# Patient Record
Sex: Male | Born: 1945
Health system: Southern US, Community
[De-identification: ages and names within clinical notes are randomized; demographics above are authoritative.]

## PROBLEM LIST (undated history)

## (undated) DIAGNOSIS — E785 Hyperlipidemia, unspecified: Secondary | ICD-10-CM

## (undated) DIAGNOSIS — N410 Acute prostatitis: Secondary | ICD-10-CM

## (undated) DIAGNOSIS — M1711 Unilateral primary osteoarthritis, right knee: Secondary | ICD-10-CM

## (undated) DIAGNOSIS — I10 Essential (primary) hypertension: Secondary | ICD-10-CM

## (undated) DIAGNOSIS — S2249XA Multiple fractures of ribs, unspecified side, initial encounter for closed fracture: Secondary | ICD-10-CM

## (undated) DIAGNOSIS — D219 Benign neoplasm of connective and other soft tissue, unspecified: Secondary | ICD-10-CM

## (undated) HISTORY — DX: Hyperlipidemia, unspecified: E78.5

## (undated) HISTORY — PX: OTHER SURGICAL HISTORY: SHX169

## (undated) HISTORY — DX: Multiple fractures of ribs, unspecified side, initial encounter for closed fracture: S22.49XA

## (undated) HISTORY — DX: Benign neoplasm of connective and other soft tissue, unspecified: D21.9

## (undated) HISTORY — DX: Acute prostatitis: N41.0

## (undated) HISTORY — DX: Essential (primary) hypertension: I10

## (undated) HISTORY — PX: CORONARY ARTERY BYPASS GRAFT: SHX141

## (undated) HISTORY — DX: Unilateral primary osteoarthritis, right knee: M17.11

---

## 2004-10-04 ENCOUNTER — Ambulatory Visit: Payer: Self-pay | Admitting: Internal Medicine

## 2005-11-08 ENCOUNTER — Ambulatory Visit: Payer: Self-pay | Admitting: Internal Medicine

## 2005-12-02 HISTORY — PX: COX-MAZE MICROWAVE ABLATION: SHX1404

## 2005-12-02 HISTORY — PX: EXCISION OF ATRIAL MYXOMA: SHX5821

## 2005-12-02 HISTORY — PX: CORONARY ARTERY BYPASS GRAFT: SHX141

## 2006-01-03 ENCOUNTER — Ambulatory Visit: Payer: Self-pay | Admitting: Internal Medicine

## 2006-01-10 ENCOUNTER — Ambulatory Visit: Payer: Self-pay | Admitting: Internal Medicine

## 2006-01-14 ENCOUNTER — Encounter: Admission: RE | Admit: 2006-01-14 | Discharge: 2006-01-14 | Payer: Self-pay | Admitting: Internal Medicine

## 2006-02-03 ENCOUNTER — Ambulatory Visit: Payer: Self-pay | Admitting: Internal Medicine

## 2006-03-04 ENCOUNTER — Ambulatory Visit: Payer: Self-pay | Admitting: Internal Medicine

## 2006-09-25 ENCOUNTER — Inpatient Hospital Stay (HOSPITAL_COMMUNITY): Admission: EM | Admit: 2006-09-25 | Discharge: 2006-10-04 | Payer: Self-pay | Admitting: *Deleted

## 2006-09-25 ENCOUNTER — Ambulatory Visit: Payer: Self-pay | Admitting: Internal Medicine

## 2006-09-26 ENCOUNTER — Encounter (INDEPENDENT_AMBULATORY_CARE_PROVIDER_SITE_OTHER): Payer: Self-pay | Admitting: *Deleted

## 2006-09-26 ENCOUNTER — Encounter: Payer: Self-pay | Admitting: Internal Medicine

## 2006-09-30 ENCOUNTER — Encounter (INDEPENDENT_AMBULATORY_CARE_PROVIDER_SITE_OTHER): Payer: Self-pay | Admitting: *Deleted

## 2006-10-07 ENCOUNTER — Ambulatory Visit: Payer: Self-pay | Admitting: *Deleted

## 2006-10-14 ENCOUNTER — Ambulatory Visit: Payer: Self-pay | Admitting: *Deleted

## 2006-10-21 ENCOUNTER — Ambulatory Visit: Payer: Self-pay | Admitting: Cardiovascular Disease

## 2006-10-28 ENCOUNTER — Ambulatory Visit: Payer: Self-pay | Admitting: Cardiology

## 2006-10-29 ENCOUNTER — Ambulatory Visit: Payer: Self-pay | Admitting: Internal Medicine

## 2006-11-11 ENCOUNTER — Ambulatory Visit: Payer: Self-pay | Admitting: Internal Medicine

## 2006-11-11 ENCOUNTER — Ambulatory Visit: Payer: Self-pay | Admitting: Cardiovascular Disease

## 2006-11-13 ENCOUNTER — Ambulatory Visit (HOSPITAL_COMMUNITY): Admission: RE | Admit: 2006-11-13 | Discharge: 2006-11-13 | Payer: Self-pay | Admitting: Internal Medicine

## 2006-11-13 ENCOUNTER — Ambulatory Visit: Payer: Self-pay | Admitting: Internal Medicine

## 2006-11-18 ENCOUNTER — Ambulatory Visit: Payer: Self-pay | Admitting: Cardiology

## 2006-12-03 ENCOUNTER — Ambulatory Visit: Payer: Self-pay | Admitting: *Deleted

## 2006-12-31 ENCOUNTER — Ambulatory Visit: Payer: Self-pay | Admitting: Internal Medicine

## 2007-01-02 ENCOUNTER — Ambulatory Visit: Payer: Self-pay | Admitting: Internal Medicine

## 2007-01-09 ENCOUNTER — Ambulatory Visit: Payer: Self-pay

## 2007-01-09 LAB — CONVERTED CEMR LAB
BUN: 13 mg/dL (ref 6–23)
Creatinine, Ser: 1.1 mg/dL (ref 0.4–1.5)
GFR calc Af Amer: 88 mL/min
GFR calc non Af Amer: 73 mL/min
Potassium: 4.4 meq/L (ref 3.5–5.1)

## 2007-01-26 ENCOUNTER — Ambulatory Visit: Payer: Self-pay | Admitting: Cardiology

## 2007-01-26 ENCOUNTER — Ambulatory Visit: Payer: Self-pay | Admitting: Internal Medicine

## 2007-02-23 ENCOUNTER — Ambulatory Visit: Payer: Self-pay | Admitting: Cardiology

## 2007-03-09 ENCOUNTER — Ambulatory Visit: Payer: Self-pay | Admitting: Cardiology

## 2007-04-06 ENCOUNTER — Ambulatory Visit: Payer: Self-pay | Admitting: Cardiovascular Disease

## 2007-04-20 ENCOUNTER — Ambulatory Visit: Payer: Self-pay | Admitting: Internal Medicine

## 2007-05-06 ENCOUNTER — Ambulatory Visit: Payer: Self-pay | Admitting: Internal Medicine

## 2007-05-06 ENCOUNTER — Ambulatory Visit: Payer: Self-pay | Admitting: Cardiology

## 2007-06-03 ENCOUNTER — Ambulatory Visit: Payer: Self-pay | Admitting: Cardiology

## 2007-06-24 ENCOUNTER — Ambulatory Visit: Payer: Self-pay | Admitting: Cardiology

## 2007-07-22 ENCOUNTER — Ambulatory Visit: Payer: Self-pay | Admitting: Cardiology

## 2007-08-19 ENCOUNTER — Ambulatory Visit: Payer: Self-pay | Admitting: Internal Medicine

## 2007-09-07 IMAGING — CR DG CHEST 1V PORT
1 series · 1 of 1 positions shown · non-contrast
Comparison: 09/25/06.

CLINICAL DATA: Patient is status post CABG.  Intubated.
PORTABLE CHEST - 1 VIEW:

[view not recorded]
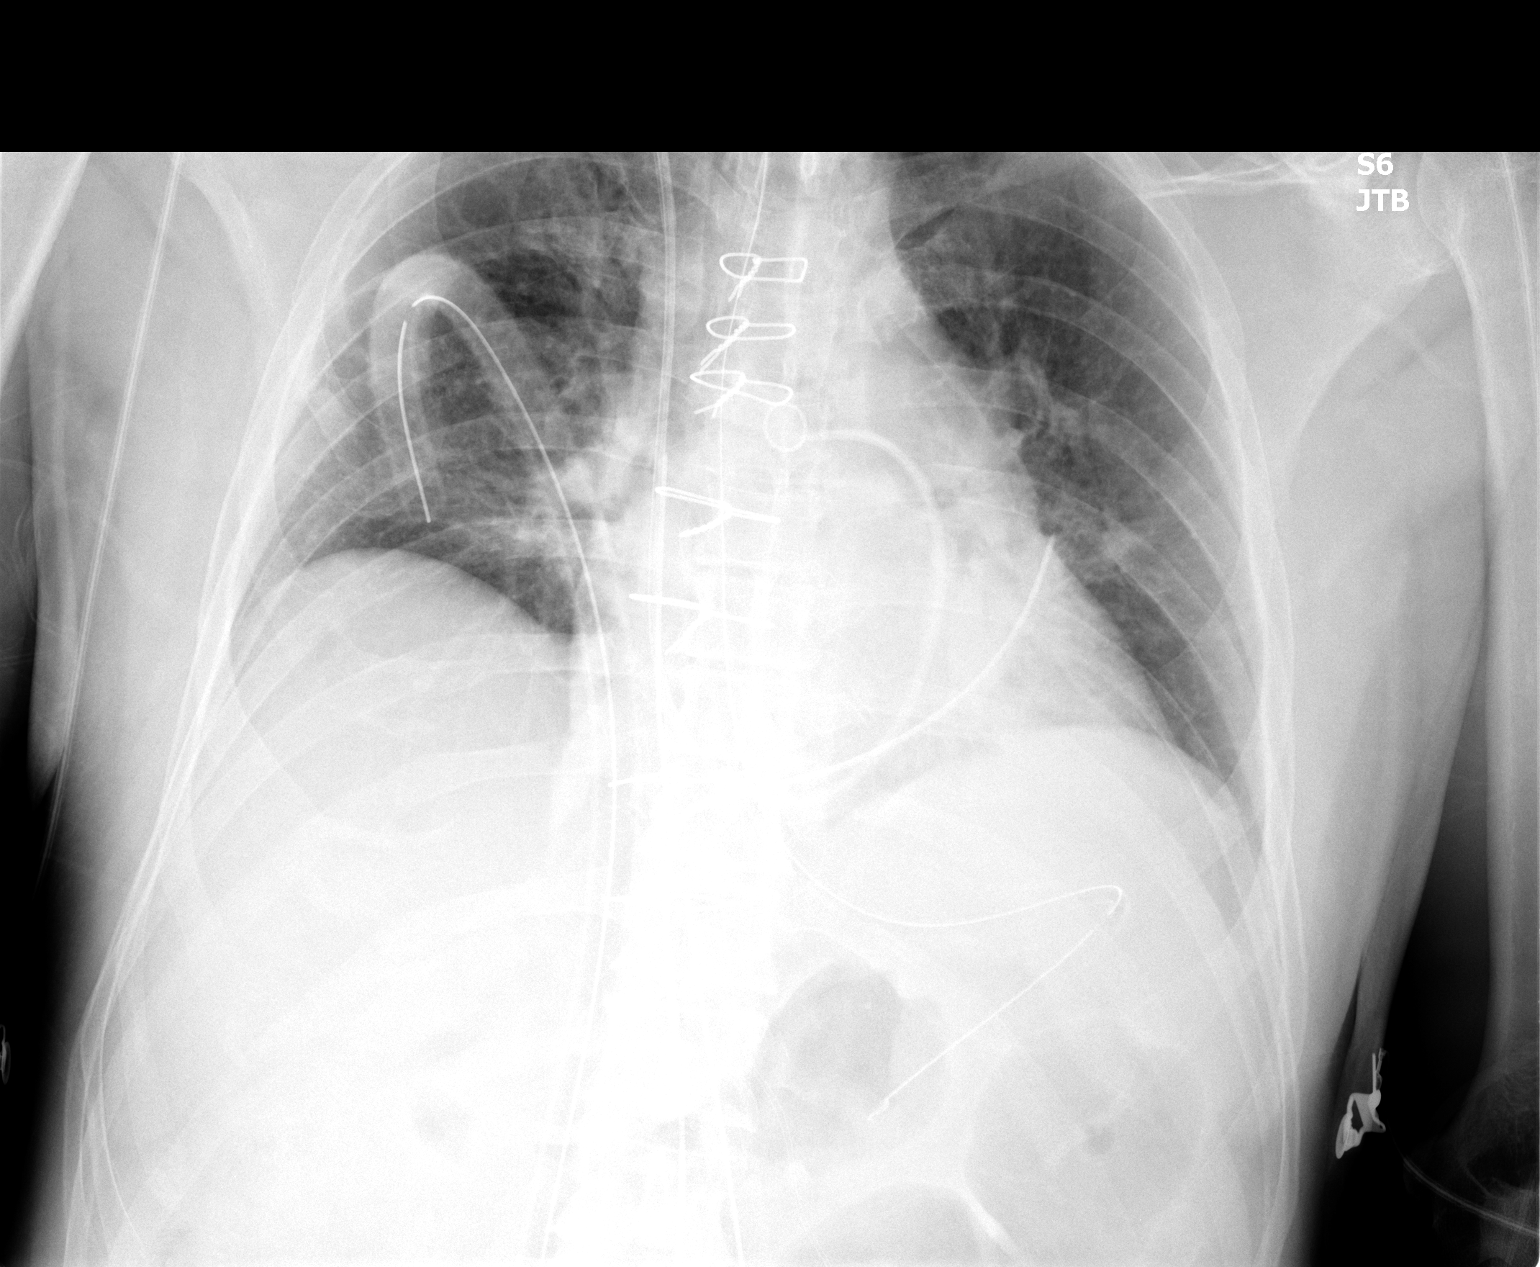

[1 of 1 positions shown; findings below may reference images not displayed]

FINDINGS: Endotracheal tube is in place and the tip at the head of the clavicle is in good position. Bilateral chest tubes are noted.  No pneumothorax. Right IJ approach Swan-Ganz catheter is seen with the tip of the Josh[place] in the right main pulmonary artery. NG tube courses into the stomach. The patient has undergone interval CABG. There is some scattered atelectasis. No effusion.
IMPRESSION: 1.  Support tubes and lines in good position.
2.  Scattered atelectasis. No pneumothorax.

## 2007-09-08 IMAGING — CR DG CHEST 1V PORT
1 series · 1 of 1 positions shown · non-contrast
Comparison: 09/30/06.

CLINICAL DATA: CABG. 
 PORTABLE CHEST ? 1 VIEW:

[view not recorded]
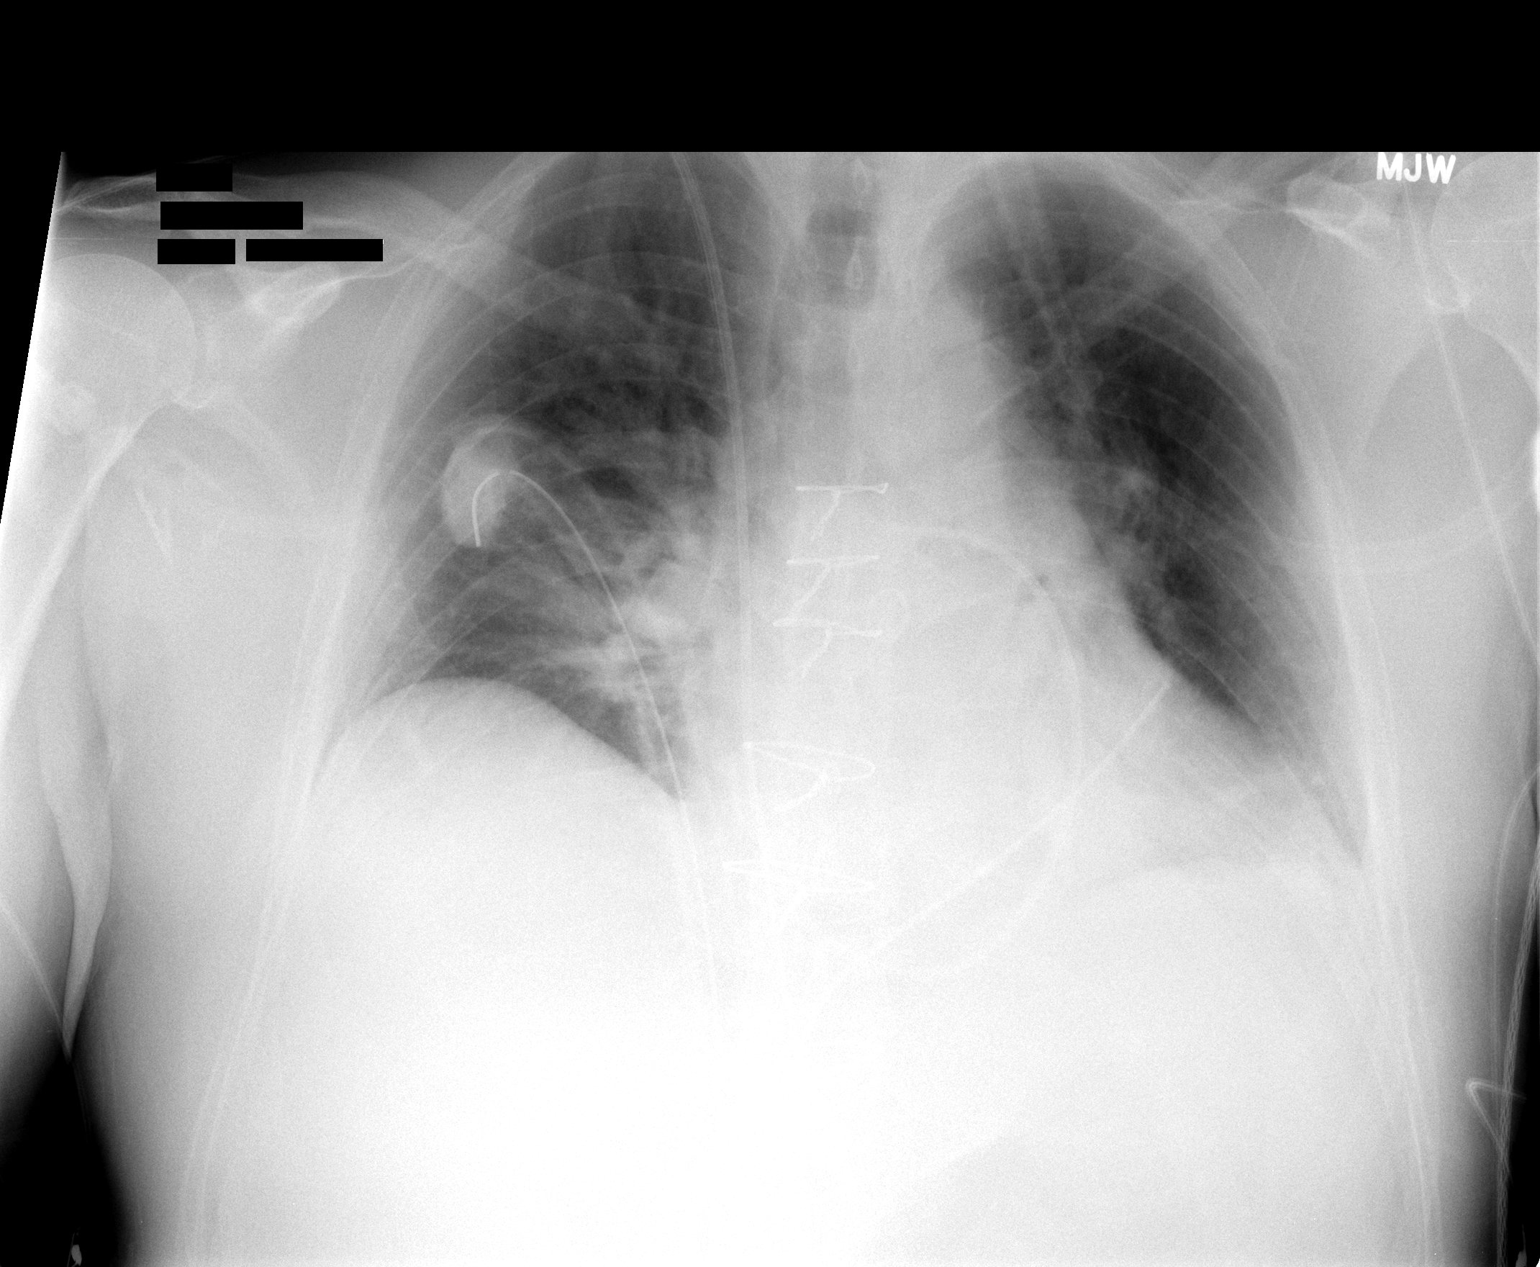

[1 of 1 positions shown; findings below may reference images not displayed]

FINDINGS: Endotracheal tube has been removed.  Right IJ Swan-Ganz catheter is stable in position.  Nasogastric tube has been removed.  Right-sided chest tube and likely mediastinal drain are stable in position.  Status-post median sternotomy.  No evidence of pneumothorax.  Minimal increase in left base atelectasis with small left pleural effusion.  Lung volumes are diminished with mild pulmonary venous congestion.
IMPRESSION: 1.  Interval extubation with decreased lung volumes and development of mild pulmonary venous congestion. 
 2.  Support apparatus appropriately positioned without evidence of pneumothorax.

## 2007-09-23 ENCOUNTER — Ambulatory Visit: Payer: Self-pay | Admitting: Cardiology

## 2007-11-05 ENCOUNTER — Ambulatory Visit: Payer: Self-pay | Admitting: Internal Medicine

## 2008-02-04 ENCOUNTER — Ambulatory Visit: Payer: Self-pay | Admitting: Internal Medicine

## 2008-02-04 DIAGNOSIS — E785 Hyperlipidemia, unspecified: Secondary | ICD-10-CM

## 2008-02-04 DIAGNOSIS — D151 Benign neoplasm of heart: Secondary | ICD-10-CM | POA: Insufficient documentation

## 2008-02-04 DIAGNOSIS — N410 Acute prostatitis: Secondary | ICD-10-CM

## 2008-02-04 DIAGNOSIS — I10 Essential (primary) hypertension: Secondary | ICD-10-CM | POA: Insufficient documentation

## 2008-02-04 DIAGNOSIS — T887XXA Unspecified adverse effect of drug or medicament, initial encounter: Secondary | ICD-10-CM | POA: Insufficient documentation

## 2008-02-04 DIAGNOSIS — D219 Benign neoplasm of connective and other soft tissue, unspecified: Secondary | ICD-10-CM

## 2008-02-04 HISTORY — DX: Essential (primary) hypertension: I10

## 2008-02-04 HISTORY — DX: Acute prostatitis: N41.0

## 2008-02-04 HISTORY — DX: Hyperlipidemia, unspecified: E78.5

## 2008-02-04 HISTORY — DX: Benign neoplasm of connective and other soft tissue, unspecified: D21.9

## 2008-02-04 LAB — CONVERTED CEMR LAB
CO2: 32 meq/L (ref 19–32)
Cholesterol: 235 mg/dL (ref 0–200)
Creatinine, Ser: 1.1 mg/dL (ref 0.4–1.5)
Direct LDL: 168.2 mg/dL
Glucose, Bld: 92 mg/dL (ref 70–99)
HDL: 40 mg/dL (ref >39.0)
Potassium: 4.9 meq/L (ref 3.5–5.1)
Sodium: 139 meq/L (ref 135–145)
Total Bilirubin: 1.2 mg/dL (ref 0.3–1.2)
Total Protein: 7.3 g/dL (ref 6.0–8.3)

## 2008-09-19 ENCOUNTER — Ambulatory Visit: Payer: Self-pay | Admitting: Internal Medicine

## 2009-03-14 ENCOUNTER — Telehealth (INDEPENDENT_AMBULATORY_CARE_PROVIDER_SITE_OTHER): Payer: Self-pay | Admitting: *Deleted

## 2009-05-22 ENCOUNTER — Emergency Department (HOSPITAL_COMMUNITY): Admission: EM | Admit: 2009-05-22 | Discharge: 2009-05-22 | Payer: Self-pay | Admitting: Emergency Medicine

## 2009-10-16 ENCOUNTER — Emergency Department (HOSPITAL_COMMUNITY): Admission: EM | Admit: 2009-10-16 | Discharge: 2009-10-16 | Payer: Self-pay | Admitting: Emergency Medicine

## 2009-10-17 ENCOUNTER — Telehealth: Payer: Self-pay | Admitting: Internal Medicine

## 2009-10-18 ENCOUNTER — Inpatient Hospital Stay (HOSPITAL_COMMUNITY): Admission: EM | Admit: 2009-10-18 | Discharge: 2009-10-22 | Payer: Self-pay | Admitting: Emergency Medicine

## 2009-10-27 ENCOUNTER — Telehealth: Payer: Self-pay | Admitting: Internal Medicine

## 2009-11-01 ENCOUNTER — Ambulatory Visit: Payer: Self-pay | Admitting: Internal Medicine

## 2009-11-01 DIAGNOSIS — S2249XA Multiple fractures of ribs, unspecified side, initial encounter for closed fracture: Secondary | ICD-10-CM | POA: Insufficient documentation

## 2009-11-01 HISTORY — DX: Multiple fractures of ribs, unspecified side, initial encounter for closed fracture: S22.49XA

## 2010-01-05 ENCOUNTER — Ambulatory Visit: Payer: Self-pay | Admitting: Internal Medicine

## 2010-01-05 LAB — CONVERTED CEMR LAB
ALT: 22 units/L (ref 0–53)
AST: 24 units/L (ref 0–37)
Albumin: 4.1 g/dL (ref 3.5–5.2)
BUN: 11 mg/dL (ref 6–23)
Basophils Relative: 0 % (ref 0.0–3.0)
Chloride: 103 meq/L (ref 96–112)
Eosinophils Relative: 9.7 % — ABNORMAL HIGH (ref 0.0–5.0)
Glucose, Bld: 98 mg/dL (ref 70–99)
Glucose, Urine, Semiquant: NEGATIVE
HCT: 42 % (ref 39.0–52.0)
Hemoglobin: 13.7 g/dL (ref 13.0–17.0)
Lymphs Abs: 1.8 10*3/uL (ref 0.7–4.0)
MCV: 88.1 fL (ref 78.0–100.0)
Monocytes Absolute: 0.5 10*3/uL (ref 0.1–1.0)
Neutro Abs: 2.1 10*3/uL (ref 1.4–7.7)
PSA: 1.73 ng/mL (ref 0.10–4.00)
Platelets: 220 10*3/uL (ref 150.0–400.0)
Potassium: 4.4 meq/L (ref 3.5–5.1)
Sodium: 139 meq/L (ref 135–145)
Specific Gravity, Urine: 1.02
TSH: 2.39 microintl units/mL (ref 0.35–5.50)
Total Bilirubin: 0.9 mg/dL (ref 0.3–1.2)
Total Protein: 8 g/dL (ref 6.0–8.3)
VLDL: 12.8 mg/dL (ref 0.0–40.0)
WBC Urine, dipstick: NEGATIVE
WBC: 4.9 10*3/uL (ref 4.5–10.5)
pH: 7

## 2010-01-18 ENCOUNTER — Ambulatory Visit: Payer: Self-pay | Admitting: Internal Medicine

## 2010-04-11 ENCOUNTER — Ambulatory Visit: Payer: Self-pay | Admitting: Internal Medicine

## 2010-04-11 LAB — CONVERTED CEMR LAB
ALT: 21 units/L (ref 0–53)
AST: 26 units/L (ref 0–37)
Alkaline Phosphatase: 51 units/L (ref 39–117)
Bilirubin, Direct: 0.1 mg/dL (ref 0.0–0.3)
Total Bilirubin: 1.1 mg/dL (ref 0.3–1.2)
Total CHOL/HDL Ratio: 5

## 2010-04-18 ENCOUNTER — Ambulatory Visit: Payer: Self-pay | Admitting: Internal Medicine

## 2010-04-18 DIAGNOSIS — M1711 Unilateral primary osteoarthritis, right knee: Secondary | ICD-10-CM

## 2010-04-18 HISTORY — DX: Unilateral primary osteoarthritis, right knee: M17.11

## 2010-04-18 LAB — CONVERTED CEMR LAB: HDL goal, serum: 40 mg/dL

## 2010-07-11 ENCOUNTER — Ambulatory Visit: Payer: Self-pay | Admitting: Internal Medicine

## 2010-07-11 LAB — CONVERTED CEMR LAB
ALT: 17 units/L (ref 0–53)
Albumin: 4 g/dL (ref 3.5–5.2)
Bilirubin, Direct: 0.2 mg/dL (ref 0.0–0.3)
Cholesterol: 197 mg/dL (ref 0–200)
LDL Cholesterol: 133 mg/dL — ABNORMAL HIGH (ref 0–99)
Total Protein: 7 g/dL (ref 6.0–8.3)
Triglycerides: 112 mg/dL (ref 0.0–149.0)
VLDL: 22.4 mg/dL (ref 0.0–40.0)

## 2010-07-18 ENCOUNTER — Ambulatory Visit: Payer: Self-pay | Admitting: Internal Medicine

## 2010-07-18 LAB — CONVERTED CEMR LAB
HDL goal, serum: 40 mg/dL
LDL Goal: 100 mg/dL

## 2010-09-26 IMAGING — CR DG CHEST 1V PORT
1 series · 1 of 1 positions shown · non-contrast
Comparison: 10/18/2009 study.  CT 10/16/2009.

CLINICAL DATA: History of left rib fracture and pulmonary failure.

PORTABLE CHEST - 1 VIEW

[AP]
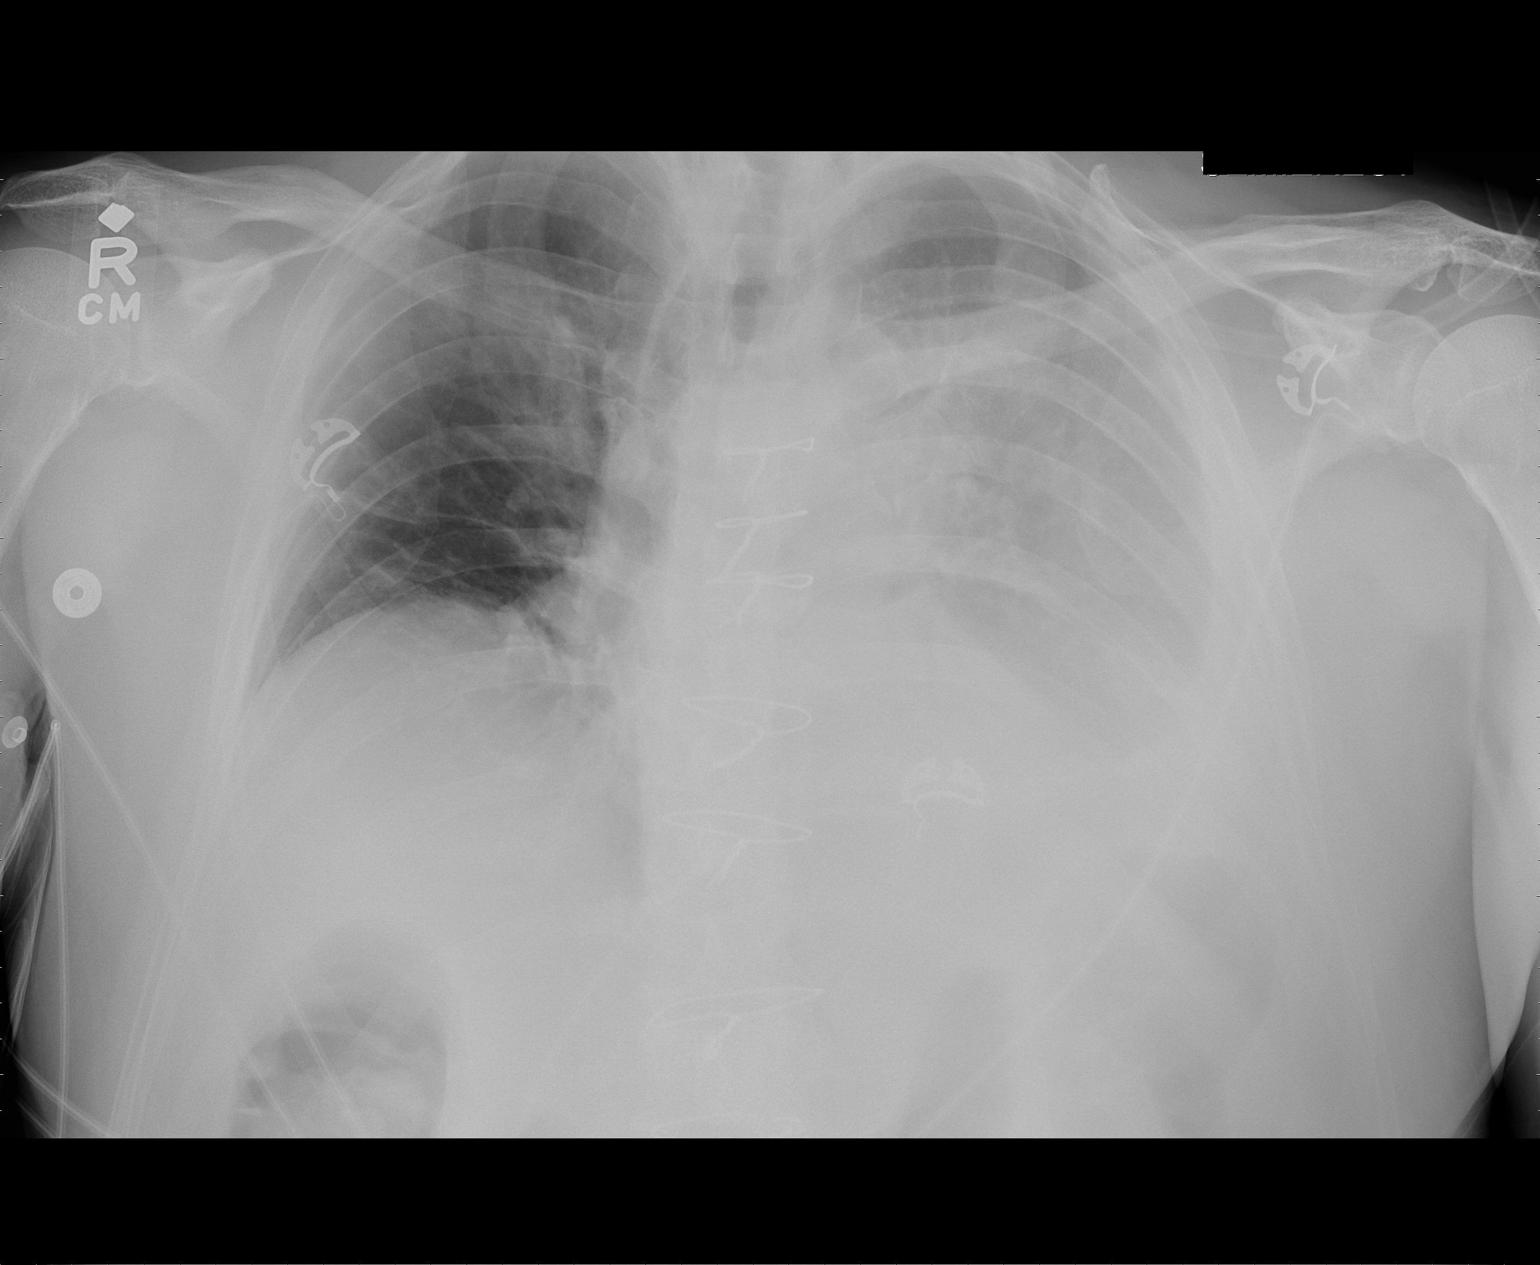

[1 of 1 positions shown; findings below may reference images not displayed]

FINDINGS: The cardiac silhouette is normal size and shape. The
patient has undergone previous median sternotomy and coronary
artery bypass grafting. There is elevation of right hemidiaphragm
with minimal right basilar atelectasis.  There is hazy opacity in
the left lung with atelectasis and left pleural effusion.  Multiple
left rib fractures are present which were best demonstrated by
previous CT.  No pneumothorax is evident.  There is slightly more
aeration of the left upper lung than on previous study.
IMPRESSION: Minimal right basilar atelectasis is present.
There is hazy opacity in the left lung with atelectasis and left
pleural effusion.  Multiple left rib fractures are present which
were best demonstrated by previous CT.  No pneumothorax is evident.
There is slightly more aeration of the left upper lung than on
previous study.

## 2011-01-03 NOTE — Assessment & Plan Note (Signed)
Summary: 3 month fup//ccm   Vital Signs:  Patient profile:   65 year old male Height:      69 inches Weight:      180 pounds BMI:     26.68 Temp:     98.2 degrees F oral Pulse rate:   68 / minute Resp:     14 per minute BP sitting:   140 / 88  (left arm)  Vitals Entered By: [name], LPN (July 18, 2010 1:27 PM) CC: roa labs, Lipid Management Is Patient Diabetic? No   Primary Care Provider:  [name]  CC:  roa labs and Lipid Management.  History of Present Illness:  Hyperlipidemia Follow-Up      This is a 65 year old man who presents for Hyperlipidemia follow-up.  The patient denies muscle aches, GI upset, abdominal pain, flushing, itching, constipation, diarrhea, and fatigue.  The patient denies the following symptoms: chest pain/pressure, exercise intolerance, dypsnea, palpitations, syncope, and pedal edema.  Compliance with medications (by patient report) has been near 100%.  Dietary compliance has been good.  The patient reports exercising occasionally.  Adjunctive measures currently used by the patient include fish oil supplements and limiting alcohol consumpton.    no generalized side effects of myalgias  HTN controlled   Lipid Management History:      Positive NCEP/ATP III risk factors include male age 64 years old or older and hypertension.  Negative NCEP/ATP III risk factors include non-tobacco-user status.     Preventive Screening-Counseling & Management  Alcohol-Tobacco     Smoking Status: quit     Tobacco Counseling: to remain off tobacco products  Current Problems (verified): 1)  Loc Osteoarthros Not Spec Prim/sec Lower Leg  (ICD-715.36) 2)  Preventive Health Care  (ICD-V70.0) 3)  Closed Fracture of Four Ribs  (ICD-807.04) 4)  Uns Advrs Eff Uns Rx Medicinal&biological Sbstnc  (ICD-995.20) 5)  Acute Prostatitis  (ICD-601.0) 6)  Hx of Atrial Myxoma  (ICD-215.9) 7)  Hypertension  (ICD-401.9) 8)  Hyperlipidemia  (ICD-272.4)  Current  Medications (verified): 1)  Metoprolol Tartrate 50 Mg  Tabs (Metoprolol Tartrate) .... Two Times A Day 2)  Lisinopril 10 Mg  Tabs (Lisinopril) .Marland Kitchen.. 1 Once Daily 3)  Crestor 20 Mg Tabs (Rosuvastatin Calcium) .... One By Mouth  Every Monday and Friday Night 4)  Aspirin Ec 325 Mg  Tbec (Aspirin) .... Once Daily  Allergies (verified): No Known Drug Allergies  Past History:  Family History: Last updated: 02/04/2008 Family History High cholesterol Family History Hypertension  Social History: Last updated: 02/04/2008 Married Former Smoker  Risk Factors: Smoking Status: quit (07/18/2010)  Past medical, surgical, family and social histories (including risk factors) reviewed, and no changes noted (except as noted below).  Past Medical History: Reviewed history from 02/04/2008 and no changes required. atrial myxoma-removal  Past Surgical History: Reviewed history from 02/04/2008 and no changes required. removal of atrial myxoma cardio version for a fib  Family History: Reviewed history from 02/04/2008 and no changes required. Family History High cholesterol Family History Hypertension  Social History: Reviewed history from 02/04/2008 and no changes required. Married Former Smoker  Review of Systems  The patient denies anorexia, fever, weight loss, weight gain, vision loss, decreased hearing, hoarseness, chest pain, syncope, dyspnea on exertion, peripheral edema, prolonged cough, headaches, hemoptysis, abdominal pain, melena, hematochezia, severe indigestion/heartburn, hematuria, incontinence, genital sores, muscle weakness, suspicious skin lesions, transient blindness, difficulty walking, depression, unusual weight change, abnormal bleeding, enlarged lymph nodes, angioedema, breast masses,  and testicular masses.    Physical Exam  General:  Well-developed,well-nourished,in no acute distress; alert,appropriate and cooperative throughout examination Head:  male-pattern balding.   normocephalic.   Eyes:  pupils equal and pupils round.   Ears:  R ear normal and L ear normal.   Neck:  No deformities, masses, or tenderness noted. Lungs:  normal respiratory effort and no intercostal retractions.   Heart:  normal rate and regular rhythm.   Abdomen:  soft and non-tender.   Msk:  no joint swelling and no joint warmth.   Neurologic:  alert & oriented X3 and gait normal.     Impression & Recommendations:  Problem # 1:  HYPERTENSION (ICD-401.9) stable His updated medication list for this problem includes:    Metoprolol Tartrate 50 Mg Tabs (Metoprolol tartrate) .Marland Kitchen..Marland Kitchen Two times a day    Lisinopril 10 Mg Tabs (Lisinopril) .Marland Kitchen... 1 once daily  BP today: 146/90 Prior BP: 142/84 (04/18/2010)  10 Yr Risk Heart Disease: 22 % Prior 10 Yr Risk Heart Disease: Not enough information (04/18/2010)  Labs Reviewed: K+: 4.4 (01/05/2010) Creat: : 1.0 (01/05/2010)   Chol: 197 (07/11/2010)   HDL: 41.90 (07/11/2010)   LDL: 133 (07/11/2010)   TG: 112.0 (07/11/2010)  Problem # 2:  HYPERLIPIDEMIA (ICD-272.4) inproved lipids His updated medication list for this problem includes:    Crestor 20 Mg Tabs (Rosuvastatin calcium) ..... One by mouth  every monday and friday night  Labs Reviewed: SGOT: 20 (07/11/2010)   SGPT: 17 (07/11/2010)  Lipid Goals: Chol Goal: 200 (07/18/2010)   HDL Goal: 40 (07/18/2010)   LDL Goal: 100 (07/18/2010)   TG Goal: 150 (07/18/2010)  10 Yr Risk Heart Disease: 22 % Prior 10 Yr Risk Heart Disease: Not enough information (04/18/2010)   HDL:41.90 (07/11/2010), 50.10 (04/11/2010)  LDL:133 (07/11/2010), DEL (02/04/2008)  Chol:197 (07/11/2010), 255 (04/11/2010)  Trig:112.0 (07/11/2010), 117.0 (04/11/2010)  Problem # 3:  LOC OSTEOARTHROS NOT SPEC PRIM/SEC LOWER LEG (ICD-715.36) knee pain improved His updated medication list for this problem includes:    Aspirin Ec 325 Mg Tbec (Aspirin) ..... Once daily  Complete Medication List: 1)  Metoprolol Tartrate 50 Mg  Tabs (Metoprolol tartrate) .... Two times a day 2)  Lisinopril 10 Mg Tabs (Lisinopril) .Marland Kitchen.. 1 once daily 3)  Crestor 20 Mg Tabs (Rosuvastatin calcium) .... One by mouth  every monday and friday night 4)  Aspirin Ec 325 Mg Tbec (Aspirin) .... Once daily  Lipid Assessment/Plan:      Based on NCEP/ATP III, the patient's risk factor category is "0-1 risk factors".  The patient's lipid goals are as follows: Total cholesterol goal is 200; LDL cholesterol goal is 100; HDL cholesterol goal is 40; Triglyceride goal is 150.  His LDL cholesterol goal has not been met.  Secondary causes for hyperlipidemia have been ruled out.  He has been counseled on adjunctive measures for lowering his cholesterol and has been provided with dietary instructions.    Patient Instructions: 1)  feb CPX  Prevention & Chronic Care Immunizations   Influenza vaccine: Historical  (09/01/2009)    Tetanus booster: 05/05/2009: Historical    Pneumococcal vaccine: Historical  (10/03/2009)    H. zoster vaccine: Not documented  Colorectal Screening   Hemoccult: Not documented    Colonoscopy: Not documented  Other Screening   PSA: 1.73  (01/05/2010)   Smoking status: quit  (07/18/2010)  Lipids   Total Cholesterol: 197  (07/11/2010)   LDL: 133  (07/11/2010)   LDL Direct: 183.4  (04/11/2010)  HDL: 41.90  (07/11/2010)   Triglycerides: 112.0  (07/11/2010)   Lipid panel due: 01/18/2011    SGOT (AST): 20  (07/11/2010)   SGPT (ALT): 17  (07/11/2010)   Alkaline phosphatase: 43  (07/11/2010)   Total bilirubin: 0.7  (07/11/2010)   Liver panel due: 01/18/2011    Lipid flowsheet reviewed?: Yes   Progress toward LDL goal: Improved  Hypertension   Last Blood Pressure: 140 / 88  (07/18/2010)   Serum creatinine: 1.0  (01/05/2010)   Serum potassium 4.4  (01/05/2010)  Self-Management Support :    Hypertension self-management support: Not documented    Lipid self-management support: Not documented    Immunization  History:  Pneumovax Immunization History:    Pneumovax:  historical (10/03/2009)

## 2011-01-03 NOTE — Assessment & Plan Note (Signed)
Summary: 3 month rov/njr   Vital Signs:  Patient profile:   65 year old male Height:      69 inches Weight:      178 pounds BMI:     26.38 Temp:     98.2 degrees F oral Pulse rate:   72 / minute Resp:     14 per minute BP sitting:   142 / 84  (left arm)  Vitals Entered By: [name], LPN (Apr 18, 2010 1:55 PM) CC: roa labs;, Lipid Management   CC:  roa labs; and Lipid Management.  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  Negative NCEP/ATP III risk factors include non-tobacco-user status.     Preventive Screening-Counseling & Management  Alcohol-Tobacco     Smoking Status: quit  Problems Prior to Update: 1)  Preventive Health Care  (ICD-V70.0) 2)  Closed Fracture of Four Ribs  (ICD-807.04) 3)  Uns Advrs Eff Uns Rx Medicinal&biological Sbstnc  (ICD-995.20) 4)  Acute Prostatitis  (ICD-601.0) 5)  Hx of Atrial Myxoma  (ICD-215.9) 6)  Hypertension  (ICD-401.9) 7)  Hyperlipidemia  (ICD-272.4)  Current Problems (verified): 1)  Preventive Health Care  (ICD-V70.0) 2)  Closed Fracture of Four Ribs  (ICD-807.04) 3)  Uns Advrs Eff Uns Rx Medicinal&biological Sbstnc  (ICD-995.20) 4)  Acute Prostatitis  (ICD-601.0) 5)  Hx of Atrial Myxoma  (ICD-215.9) 6)  Hypertension  (ICD-401.9) 7)  Hyperlipidemia  (ICD-272.4)  Medications Prior to Update: 1)  Metoprolol Tartrate 50 Mg  Tabs (Metoprolol Tartrate) .... Two Times A Day 2)  Lisinopril 10 Mg  Tabs (Lisinopril) .Marland Kitchen.. 1 Once Daily 3)  Crestor 20 Mg Tabs (Rosuvastatin Calcium) .... One By Mouth  Every Friday Night 4)  Aspirin Ec 325 Mg  Tbec (Aspirin) .... Once Daily  Current Medications (verified): 1)  Metoprolol Tartrate 50 Mg  Tabs (Metoprolol Tartrate) .... Two Times  A Day 2)  Lisinopril 10 Mg  Tabs (Lisinopril) .Marland Kitchen.. 1 Once Daily 3)  Crestor 20 Mg Tabs (Rosuvastatin Calcium) .... One By Mouth  Every Monday and Friday Night 4)  Aspirin Ec 325 Mg  Tbec (Aspirin) .... Once Daily  Allergies (verified): No Known Drug Allergies  Past History:  Family History: Last updated: 02/04/2008 Family History High cholesterol Family History Hypertension  Social History: Last updated: 02/04/2008 Married Former Smoker  Risk Factors: Smoking Status: quit (04/18/2010)  Past medical, surgical, family and social histories (including risk factors) reviewed, and no changes noted (except as noted below).  Past Medical History: Reviewed history from 02/04/2008 and no changes required. atrial myxoma-removal  Past Surgical History: Reviewed history from 02/04/2008 and no changes required. removal of atrial myxoma cardio version for a fib  Family History: Reviewed history from 02/04/2008 and no changes required. Family History High cholesterol Family History Hypertension  Social History: Reviewed history from 02/04/2008 and no changes required. Married Former Smoker  Review of Systems  The patient denies anorexia, fever, weight loss, weight gain, vision loss, decreased hearing, hoarseness, chest pain, syncope, dyspnea on exertion, peripheral edema, prolonged cough, headaches, hemoptysis, abdominal pain, melena, hematochezia, severe indigestion/heartburn, hematuria, incontinence, genital sores, muscle weakness, suspicious skin lesions, transient blindness, difficulty walking, depression, unusual weight change, abnormal bleeding, enlarged lymph nodes, angioedema, and  breast masses.    Physical Exam  General:  Well-developed,well-nourished,in no acute distress; alert,appropriate and cooperative throughout examination Head:  male-pattern balding.   Eyes:  pupils equal and pupils round.   Ears:  R ear normal and L ear normal.   Nose:  no external deformity and  no nasal discharge.   Neck:  No deformities, masses, or tenderness noted. Lungs:  normal respiratory effort and no intercostal retractions.   Heart:  normal rate and regular rhythm.   Neurologic:  No cranial nerve deficits noted. Station and gait are normal. Plantar reflexes are down-going bilaterally. DTRs are symmetrical throughout. Sensory, motor and coordinative functions appear intact.   Impression & Recommendations:  Problem # 1:  HYPERLIPIDEMIA (ICD-272.4)  increas the crestor to monday and friday nights His updated medication list for this problem includes:    Crestor 20 Mg Tabs (Rosuvastatin calcium) ..... One by mouth  every monday and friday night  Labs Reviewed: SGOT: 26 (04/11/2010)   SGPT: 21 (04/11/2010)  Lipid Goals: Chol Goal: 200 (04/18/2010)   HDL Goal: 40 (04/18/2010)   LDL Goal: 130 (04/18/2010)   TG Goal: 150 (04/18/2010)  10 Yr Risk Heart Disease: Not enough information   HDL:50.10 (04/11/2010), 55.10 (01/05/2010)  LDL:DEL (02/04/2008)  Chol:255 (04/11/2010), 221 (01/05/2010)  Trig:117.0 (04/11/2010), 64.0 (01/05/2010)  Problem # 2:  HYPERTENSION (ICD-401.9)  His updated medication list for this problem includes:    Metoprolol Tartrate 50 Mg Tabs (Metoprolol tartrate) .Marland Kitchen..Marland Kitchen Two times a day    Lisinopril 10 Mg Tabs (Lisinopril) .Marland Kitchen... 1 once daily  BP today: 142/84 Prior BP: 150/84 (01/18/2010)  10 Yr Risk Heart Disease: Not enough information  Labs Reviewed: K+: 4.4 (01/05/2010) Creat: : 1.0 (01/05/2010)   Chol: 255 (04/11/2010)   HDL: 50.10 (04/11/2010)   LDL: DEL (02/04/2008)   TG: 117.0 (04/11/2010)  Problem # 3:  LOC OSTEOARTHROS NOT SPEC PRIM/SEC LOWER LEG (ICD-715.36)  Informed consent obtained and then the joint was prepped in a sterile manor and 40 mg depo and 1/2 cc 1% lidocaine injected into the synovial space. After care discussed. Pt tolerated procedure well.  His updated medication list for this problem includes:    Aspirin Ec 325 Mg Tbec  (Aspirin) ..... Once daily  Discussed use of medications, application of heat or cold, and exercises.   Orders: Joint Aspirate / Injection, Large (20610) Depo- Medrol 40mg  ([id number])  Complete Medication List: 1)  Metoprolol Tartrate 50 Mg Tabs (Metoprolol tartrate) .... Two times a day 2)  Lisinopril 10 Mg Tabs (Lisinopril) .Marland Kitchen.. 1 once daily 3)  Crestor 20 Mg Tabs (Rosuvastatin calcium) .... One by mouth  every monday and friday night 4)  Aspirin Ec 325 Mg Tbec (Aspirin) .... Once daily  Lipid Assessment/Plan:      Based on NCEP/ATP III, the patient's risk factor category is "2 or more risk factors and a calculated 10 year CAD risk of > 20%".  The patient's lipid goals are as follows: Total cholesterol goal is 200; LDL cholesterol goal is 130; HDL cholesterol goal is 40; Triglyceride goal is 150.    Patient Instructions: 1)  Please schedule a follow-up appointment in 3 months. 2)  Hepatic Panel prior to visit, ICD-9:995.20 3)  Lipid Panel prior to visit, ICD-9:272.4   Immunization History:  Tetanus/Td Immunization History:    Tetanus/Td:  historical (05/05/2009)  Influenza Immunization History:    Influenza:  historical (09/01/2009)

## 2011-01-03 NOTE — Assessment & Plan Note (Signed)
Summary: cpx/njr..R/S WITH WIFE//SLM   Vital Signs:  Patient profile:   65 year old male Height:      69 inches Weight:      180 pounds BMI:     26.68 Temp:     98.2 degrees F oral Pulse rate:   76 / minute Resp:     14 per minute BP sitting:   150 / 84  (left arm)  Vitals Entered By: [name], LPN (January 18, 2010 3:14 PM)  Nutrition Counseling: Patient's BMI is greater than 25 and therefore counseled on weight management options. CC: cpx Is Patient Diabetic? No   CC:  cpx.  History of Present Illness: The pt was asked about all immunizations, health maint. services that are appropriate to their age and was given guidance on diet exercize  and weight management   Preventive Screening-Counseling & Management  Alcohol-Tobacco     Smoking Status: quit  Problems Prior to Update: 1)  Closed Fracture of Four Ribs  (ICD-807.04) 2)  Uns Advrs Eff Uns Rx Medicinal&biological Sbstnc  (ICD-995.20) 3)  Acute Prostatitis  (ICD-601.0) 4)  Atrial Myxoma  (ICD-215.9) 5)  Congestive Heart Failure  (ICD-428.0) 6)  Atrial Fibrillation  (ICD-427.31) 7)  Hypertension  (ICD-401.9) 8)  Hyperlipidemia  (ICD-272.4)  Current Problems (verified): 1)  Closed Fracture of Four Ribs  (ICD-807.04) 2)  Uns Advrs Eff Uns Rx Medicinal&biological Sbstnc  (ICD-995.20) 3)  Acute Prostatitis  (ICD-601.0) 4)  Atrial Myxoma  (ICD-215.9) 5)  Congestive Heart Failure  (ICD-428.0) 6)  Atrial Fibrillation  (ICD-427.31) 7)  Hypertension  (ICD-401.9) 8)  Hyperlipidemia  (ICD-272.4)  Medications Prior to Update: 1)  Metoprolol Tartrate 50 Mg  Tabs (Metoprolol Tartrate) .... Two Times A Day 2)  Lisinopril 10 Mg  Tabs (Lisinopril) .Marland Kitchen.. 1 Once Daily 3)  Lipitor 10 Mg  Tabs (Atorvastatin Calcium) .... Once Daily 4)  Aspirin Ec 325 Mg  Tbec (Aspirin) .... Once Daily 5)  Cipro 500 Mg  Tabs (Ciprofloxacin Hcl) .... One By Mouth Two Times A Day For 21 Days 6)  Vicodin Hp 10-660 Mg Tabs  (Hydrocodone-Acetaminophen) .... One By Mouth 6 Hrs As Needed For Pain  Current Medications (verified): 1)  Metoprolol Tartrate 50 Mg  Tabs (Metoprolol Tartrate) .... Two Times A Day 2)  Lisinopril 10 Mg  Tabs (Lisinopril) .Marland Kitchen.. 1 Once Daily 3)  Crestor 20 Mg Tabs (Rosuvastatin Calcium) .... One By Mouth  Every Friday Night 4)  Aspirin Ec 325 Mg  Tbec (Aspirin) .... Once Daily  Allergies (verified): No Known Drug Allergies  Past History:  Family History: Last updated: 02/04/2008 Family History High cholesterol Family History Hypertension  Social History: Last updated: 02/04/2008 Married Former Smoker  Risk Factors: Smoking Status: quit (01/18/2010)  Past medical, surgical, family and social histories (including risk factors) reviewed, and no changes noted (except as noted below).  Past Medical History: Reviewed history from 02/04/2008 and no changes required. atrial myxoma-removal  Past Surgical History: Reviewed history from 02/04/2008 and no changes required. removal of atrial myxoma cardio version for a fib  Family History: Reviewed history from 02/04/2008 and no changes required. Family History High cholesterol Family History Hypertension  Social History: Reviewed history from 02/04/2008 and no changes required. Married Former Smoker  Review of Systems  The patient denies anorexia, fever, weight loss, weight gain, vision loss, decreased hearing, hoarseness, chest pain, syncope, dyspnea on exertion, peripheral edema, prolonged cough, headaches, hemoptysis, abdominal pain, melena, hematochezia, severe indigestion/heartburn, hematuria, incontinence, genital sores,  muscle weakness, suspicious skin lesions, transient blindness, difficulty walking, depression, unusual weight change, abnormal bleeding, enlarged lymph nodes, angioedema, breast masses, and testicular masses.    Physical Exam  General:  Well-developed,well-nourished,in no acute distress;  alert,appropriate and cooperative throughout examination Head:  male-pattern balding.   Eyes:  pupils equal and pupils round.   Ears:  R ear normal and L ear normal.   Nose:  no external deformity and no nasal discharge.   Neck:  No deformities, masses, or tenderness noted. Lungs:  normal respiratory effort and no intercostal retractions.   Heart:  normal rate and regular rhythm.   Rectal:  no masses and external hemorrhoid(s).   Genitalia:  circumcised and no urethral discharge.   Prostate:  no nodules, no asymmetry, and 1+ enlarged.   Msk:  normal ROM.   Pulses:  R and L carotid,radial,femoral,dorsalis pedis and posterior tibial pulses are full and equal bilaterally Extremities:  No clubbing, cyanosis, edema, or deformity noted with normal full range of motion of all joints.   Neurologic:  No cranial nerve deficits noted. Station and gait are normal. Plantar reflexes are down-going bilaterally. DTRs are symmetrical throughout. Sensory, motor and coordinative functions appear intact. Skin:  Intact without suspicious lesions or rashes Cervical Nodes:  No lymphadenopathy noted   Impression & Recommendations:  Problem # 1:  HYPERTENSION (ICD-401.9)  His updated medication list for this problem includes:    Metoprolol Tartrate 50 Mg Tabs (Metoprolol tartrate) .Marland Kitchen..Marland Kitchen Two times a day    Lisinopril 10 Mg Tabs (Lisinopril) .Marland Kitchen... 1 once daily  BP today: 150/84 Prior BP: 124/70 (11/01/2009)  Labs Reviewed: K+: 4.4 (01/05/2010) Creat: : 1.0 (01/05/2010)   Chol: 221 (01/05/2010)   HDL: 55.10 (01/05/2010)   LDL: DEL (02/04/2008)   TG: 64.0 (01/05/2010)  Problem # 2:  HYPERLIPIDEMIA (ICD-272.4)  His updated medication list for this problem includes:    Crestor 20 Mg Tabs (Rosuvastatin calcium) ..... One by mouth  every friday night  Complete Medication List: 1)  Metoprolol Tartrate 50 Mg Tabs (Metoprolol tartrate) .... Two times a day 2)  Lisinopril 10 Mg Tabs (Lisinopril) .Marland Kitchen.. 1 once  daily 3)  Crestor 20 Mg Tabs (Rosuvastatin calcium) .... One by mouth  every friday night 4)  Aspirin Ec 325 Mg Tbec (Aspirin) .... Once daily  Other Orders: EKG w/ Interpretation (93000)  Patient Instructions: 1)  Please schedule a follow-up appointment in 3 months. 2)  Hepatic Panel prior to visit, ICD-9:995.20 3)  Lipid Panel prior to visit, ICD-9:272.4 Prescriptions: LISINOPRIL 10 MG  TABS (LISINOPRIL) 1 once daily  #30 Tablet x 11   Entered and Authorized by:   [name]   Signed by:   [name] on 01/18/2010   Method used:   Electronically to        [facility]  [address] [id number] ([facility])       [address]       [place], [place]  [address]       Ph: [phone] or [phone]       Fax: [phone]   RxID:   [id number] METOPROLOL TARTRATE 50 MG  TABS (METOPROLOL TARTRATE) two times a day  #60 Tablet x 11   Entered and Authorized by:   [name]   Signed by:   [name] on 01/18/2010   Method used:   [facility]ically to        [facility]  [address] [id number] ([facility]       [address]       [place], [place]  [address]       Ph: [phone] or [phone]       Fax: [phone]   RxID:   [id number]

## 2011-01-14 ENCOUNTER — Other Ambulatory Visit: Payer: BC Managed Care – PPO | Admitting: Internal Medicine

## 2011-01-14 DIAGNOSIS — Z0000 Encounter for general adult medical examination without abnormal findings: Secondary | ICD-10-CM

## 2011-01-14 DIAGNOSIS — E785 Hyperlipidemia, unspecified: Secondary | ICD-10-CM

## 2011-01-14 LAB — CBC WITH DIFFERENTIAL/PLATELET
Basophils Absolute: 0 10*3/uL (ref 0.0–0.1)
HCT: 43.4 % (ref 39.0–52.0)
Hemoglobin: 15.1 g/dL (ref 13.0–17.0)
Lymphs Abs: 2.2 10*3/uL (ref 0.7–4.0)
MCHC: 34.7 g/dL (ref 30.0–36.0)
MCV: 91.4 fl (ref 78.0–100.0)
Monocytes Absolute: 0.8 10*3/uL (ref 0.1–1.0)
Monocytes Relative: 10.8 % (ref 3.0–12.0)
Neutro Abs: 3.7 10*3/uL (ref 1.4–7.7)
Platelets: 238 10*3/uL (ref 150.0–400.0)
RDW: 12.7 % (ref 11.5–14.6)

## 2011-01-14 LAB — HEPATIC FUNCTION PANEL
AST: 23 U/L (ref 0–37)
Albumin: 4 g/dL (ref 3.5–5.2)
Total Bilirubin: 0.9 mg/dL (ref 0.3–1.2)

## 2011-01-14 LAB — BASIC METABOLIC PANEL
BUN: 10 mg/dL (ref 6–23)
CO2: 30 mEq/L (ref 19–32)
Chloride: 97 mEq/L (ref 96–112)
Glucose, Bld: 86 mg/dL (ref 70–99)
Potassium: 3.7 mEq/L (ref 3.5–5.1)
Sodium: 140 mEq/L (ref 135–145)

## 2011-01-14 LAB — LIPID PANEL
Cholesterol: 224 mg/dL — ABNORMAL HIGH (ref 0–200)
Triglycerides: 157 mg/dL — ABNORMAL HIGH (ref 0.0–149.0)
VLDL: 31.4 mg/dL (ref 0.0–40.0)

## 2011-01-14 LAB — TSH: TSH: 3.56 u[IU]/mL (ref 0.35–5.50)

## 2011-01-14 LAB — POCT URINALYSIS DIPSTICK
Bilirubin, UA: NEGATIVE
Blood, UA: NEGATIVE
Glucose, UA: NEGATIVE
Spec Grav, UA: 1.02
Urobilinogen, UA: 0.2
pH, UA: 7

## 2011-01-14 LAB — LDL CHOLESTEROL, DIRECT: Direct LDL: 158.2 mg/dL

## 2011-01-18 ENCOUNTER — Encounter: Payer: Self-pay | Admitting: *Deleted

## 2011-01-21 ENCOUNTER — Encounter: Payer: Self-pay | Admitting: Internal Medicine

## 2011-01-21 ENCOUNTER — Ambulatory Visit (INDEPENDENT_AMBULATORY_CARE_PROVIDER_SITE_OTHER): Payer: BC Managed Care – PPO | Admitting: Internal Medicine

## 2011-01-21 VITALS — BP 164/100 | HR 72 | Temp 98.2°F | Resp 14 | Ht 69.0 in | Wt 184.0 lb

## 2011-01-21 DIAGNOSIS — N410 Acute prostatitis: Secondary | ICD-10-CM

## 2011-01-21 DIAGNOSIS — Z0000 Encounter for general adult medical examination without abnormal findings: Secondary | ICD-10-CM

## 2011-01-21 DIAGNOSIS — E785 Hyperlipidemia, unspecified: Secondary | ICD-10-CM

## 2011-01-21 DIAGNOSIS — I10 Essential (primary) hypertension: Secondary | ICD-10-CM

## 2011-01-21 MED ORDER — LISINOPRIL 20 MG PO TABS: 20.0000 mg | ORAL_TABLET | Freq: Every day | ORAL | Status: DC

## 2011-01-21 MED ORDER — DOXYCYCLINE HYCLATE 100 MG PO TABS: 100.0000 mg | ORAL_TABLET | Freq: Two times a day (BID) | ORAL | Status: AC

## 2011-01-21 NOTE — Assessment & Plan Note (Signed)
Patient presents for his routine physical examination his blood pressure is elevated today 160/80 of note he was seen in urgent care several weeks ago his blood pressure was also elevated at that time he's been on the same medication for quite sometime now it's apparent that the lisinopril needs to be increased will increase from 10-20 and monitor him in 2 months

## 2011-01-21 NOTE — Assessment & Plan Note (Signed)
On physical examination today the patient has a markedly boggy prostate consistent with acute prostatitis will treat with an antibiotic and recheck his PSA to confirm that this was the etiology of the increased PSA

## 2011-01-21 NOTE — Progress Notes (Signed)
Subjective:    Patient ID: Ethan Payne, male    DOB: [birth year], 65 y.o.   MRN: [id number]  HPI Patient is a pleasant 65 year old white male who presents for his annual examination.   he has been doing well is compliant with his medications he has no acute complaints.   His blood pressure was noted to be elevated at this visit and he relates a recent history of an elevated blood pressure detected at an urgent care visit for an upper respiratory tract infection.  He denies any headache shortness of breath PND orthopnea or chest pain.  He has  been compliant therapy Crestor but admits to dietary noncompliance over the past few months he exercises regularly but has gained weight due to to poor diet.   he has noticed some increased urinary frequency but no back pain or dysuria he has a history of prostatitis the PSA was elevated on his screening blood work. All the rest of the blood work was reviewed with the patient as per protocol. All immunizations and health maintenance protocols were reviewed with patient     Review of Systems  Constitutional: Negative for fever and fatigue.  HENT: Negative for hearing loss, congestion, neck pain and postnasal drip.   Eyes: Negative for discharge, redness and visual disturbance.  Respiratory: Negative for cough, shortness of breath and wheezing.   Cardiovascular: Negative for leg swelling.  Gastrointestinal: Negative for abdominal pain, constipation and abdominal distention.  Genitourinary: Negative for urgency and frequency.  Musculoskeletal: Negative for joint swelling and arthralgias.  Skin: Negative for color change and rash.  Neurological: Negative for weakness and light-headedness.  Hematological: Negative for adenopathy.  Psychiatric/Behavioral: Negative for behavioral problems.   Past Medical History  Diagnosis Date  . Acute prostatitis 02/04/2008  . ATRIAL MYXOMA 02/04/2008  . Closed fracture of four ribs 11/01/2009  . HYPERLIPIDEMIA  02/04/2008  . HYPERTENSION 02/04/2008  . LOC OSTEOARTHROS NOT SPEC PRIM/SEC LOWER LEG 04/18/2010  . UNS ADVRS EFF UNS RX MEDICINAL&BIOLOGICAL SBSTNC 02/04/2008   Past Surgical History  Procedure Date  . Removal of atrial myxoma   . Cardio version for a fibi     reports that he has never smoked. He does not have any smokeless tobacco history on file. He reports that he does not drink alcohol or use illicit drugs. family history includes Heart disease in his father; Hypertension in his father; and Stroke in his mother.  I have reviewed this patient's past medical history surgical history social history current medication list and current allergy list and have documented appropriate changes as necessary        Objective:   Physical Exam  Constitutional: He is oriented to person, place, and time. He appears well-developed and well-nourished.        Male pattern balding the patient is in no apparent distress  HENT:  Head: Normocephalic and atraumatic.  Eyes: Conjunctivae are normal. Pupils are equal, round, and reactive to light.  Neck: Normal range of motion. Neck supple.  Cardiovascular: Normal rate and regular rhythm.   Pulmonary/Chest: Effort normal and breath sounds normal.  Abdominal: Soft. Bowel sounds are normal.  Genitourinary:        Normal architecture of the prostate but is +2 enlarged and boggy  Musculoskeletal: Normal range of motion.  Neurological: He is alert and oriented to person, place, and time.  Skin: Skin is warm and dry.  Psychiatric: His behavior is normal. Thought content normal.  Assessment & Plan:   Patient presents for yearly preventative medicine examination.   all immunizations and health maintenance protocols were reviewed with the patient and they are up to date with these protocols.   screening laboratory values were reviewed with the patient including screening of hyperlipidemia PSA renal function and hepatic function.   There medications  past medical history social history problem list and allergies were reviewed in detail.   Goals were established with regard to weight loss exercise diet in compliance with medications

## 2011-01-21 NOTE — Assessment & Plan Note (Signed)
Will increase the Crestor to 20 mg 3 times a week. He admits that his diet has been less than optimal over the past few months and we reinforced the idea that it has to be a combination of medications and watching her diet and weight and exercise

## 2011-02-12 ENCOUNTER — Other Ambulatory Visit: Payer: Self-pay | Admitting: Internal Medicine

## 2011-03-06 LAB — GLUCOSE, CAPILLARY
Glucose-Capillary: 113 mg/dL — ABNORMAL HIGH (ref 70–99)
Glucose-Capillary: 114 mg/dL — ABNORMAL HIGH (ref 70–99)
Glucose-Capillary: 115 mg/dL — ABNORMAL HIGH (ref 70–99)
Glucose-Capillary: 120 mg/dL — ABNORMAL HIGH (ref 70–99)
Glucose-Capillary: 126 mg/dL — ABNORMAL HIGH (ref 70–99)
Glucose-Capillary: 129 mg/dL — ABNORMAL HIGH (ref 70–99)
Glucose-Capillary: 159 mg/dL — ABNORMAL HIGH (ref 70–99)

## 2011-03-06 LAB — DIFFERENTIAL
Basophils Absolute: 0 10*3/uL (ref 0.0–0.1)
Basophils Relative: 0 % (ref 0–1)
Eosinophils Absolute: 0.1 10*3/uL (ref 0.0–0.7)
Monocytes Absolute: 1.6 10*3/uL — ABNORMAL HIGH (ref 0.1–1.0)
Neutro Abs: 14.3 10*3/uL — ABNORMAL HIGH (ref 1.7–7.7)
Neutrophils Relative %: 79 % — ABNORMAL HIGH (ref 43–77)

## 2011-03-06 LAB — COMPREHENSIVE METABOLIC PANEL
ALT: 21 U/L (ref 0–53)
Alkaline Phosphatase: 36 U/L — ABNORMAL LOW (ref 39–117)
BUN: 18 mg/dL (ref 6–23)
Chloride: 102 mEq/L (ref 96–112)
Glucose, Bld: 177 mg/dL — ABNORMAL HIGH (ref 70–99)
Potassium: 3.7 mEq/L (ref 3.5–5.1)
Sodium: 134 mEq/L — ABNORMAL LOW (ref 135–145)
Total Bilirubin: 1 mg/dL (ref 0.3–1.2)

## 2011-03-06 LAB — BASIC METABOLIC PANEL
CO2: 26 mEq/L (ref 19–32)
Calcium: 9 mg/dL (ref 8.4–10.5)
Creatinine, Ser: 1.01 mg/dL (ref 0.4–1.5)
GFR calc Af Amer: >60 mL/min (ref >60)
GFR calc non Af Amer: >60 mL/min (ref >60)
Sodium: 138 mEq/L (ref 135–145)

## 2011-03-06 LAB — POCT I-STAT, CHEM 8
BUN: 20 mg/dL (ref 6–23)
Creatinine, Ser: 1 mg/dL (ref 0.4–1.5)
Glucose, Bld: 175 mg/dL — ABNORMAL HIGH (ref 70–99)
Hemoglobin: 11.6 g/dL — ABNORMAL LOW (ref 13.0–17.0)
Potassium: 3.8 mEq/L (ref 3.5–5.1)
Sodium: 138 mEq/L (ref 135–145)

## 2011-03-06 LAB — TYPE AND SCREEN: ABO/RH(D): A POS

## 2011-03-06 LAB — BRAIN NATRIURETIC PEPTIDE: Pro B Natriuretic peptide (BNP): 51 pg/mL (ref 0.0–100.0)

## 2011-03-06 LAB — CBC
HCT: 33.1 % — ABNORMAL LOW (ref 39.0–52.0)
Hemoglobin: 12.2 g/dL — ABNORMAL LOW (ref 13.0–17.0)
MCHC: 35.3 g/dL (ref 30.0–36.0)
MCV: 92.1 fL (ref 78.0–100.0)
Platelets: 214 10*3/uL (ref 150–400)
RBC: 3.6 MIL/uL — ABNORMAL LOW (ref 4.22–5.81)
RBC: 3.75 MIL/uL — ABNORMAL LOW (ref 4.22–5.81)
WBC: 13.5 10*3/uL — ABNORMAL HIGH (ref 4.0–10.5)
WBC: 18.1 10*3/uL — ABNORMAL HIGH (ref 4.0–10.5)

## 2011-03-06 LAB — POCT CARDIAC MARKERS
CKMB, poc: 2.1 ng/mL (ref 1.0–8.0)
Myoglobin, poc: 99.6 ng/mL (ref 12–200)

## 2011-03-06 LAB — HEMOGLOBIN A1C: Hgb A1c MFr Bld: 5.7 % (ref 4.6–6.1)

## 2011-03-22 ENCOUNTER — Encounter: Payer: Self-pay | Admitting: Internal Medicine

## 2011-03-22 ENCOUNTER — Ambulatory Visit (INDEPENDENT_AMBULATORY_CARE_PROVIDER_SITE_OTHER): Payer: BC Managed Care – PPO | Admitting: Internal Medicine

## 2011-03-22 VITALS — BP 140/80 | HR 76 | Temp 98.2°F | Resp 14 | Ht 70.0 in | Wt 182.0 lb

## 2011-03-22 DIAGNOSIS — M79673 Pain in unspecified foot: Secondary | ICD-10-CM | POA: Insufficient documentation

## 2011-03-22 DIAGNOSIS — M7730 Calcaneal spur, unspecified foot: Secondary | ICD-10-CM

## 2011-03-22 DIAGNOSIS — K219 Gastro-esophageal reflux disease without esophagitis: Secondary | ICD-10-CM | POA: Insufficient documentation

## 2011-03-22 DIAGNOSIS — M79609 Pain in unspecified limb: Secondary | ICD-10-CM

## 2011-03-22 MED ORDER — METHYLPREDNISOLONE ACETATE 40 MG/ML IJ SUSP: 40.0000 mg | Freq: Once | INTRAMUSCULAR | Status: DC

## 2011-03-22 MED ORDER — CELECOXIB 200 MG PO CAPS: 200.0000 mg | ORAL_CAPSULE | Freq: Every day | ORAL | Status: DC

## 2011-03-22 NOTE — Assessment & Plan Note (Signed)
Point injection of Depo-Medrol was given in the left foot.  Informed consent obtained 40 of Depo-Medrol was injected at the point of pain of the heel spur into the calcaneal region of the left foot the patient tolerated the procedure well

## 2011-03-22 NOTE — Progress Notes (Signed)
  Subjective:    Patient ID: Ethan Payne, male    DOB: [birth year], 65 y.o.   MRN: [id number]  HPI patient is a 65 year old white male presents with 2 complaints of right knee pain and left heel pain he states that this has been worsening over the past few months her knee pain is chronic heel pain is acute he states that he wakes up in the morning and severely painful like it but then the nail and gradually works its way out during the day.  Marland Kitchen His blood pressure is well-controlled he is developed a chronic cough is worse we laid down at night which give some suggestion of reflux but he is also on an ACE inhibitor.      Review of Systems  Constitutional: Negative for fever and fatigue.  HENT: Negative for hearing loss, congestion, neck pain and postnasal drip.   Eyes: Negative for discharge, redness and visual disturbance.  Respiratory: Negative for cough, shortness of breath and wheezing.   Cardiovascular: Negative for leg swelling.  Gastrointestinal: Negative for abdominal pain, constipation and abdominal distention.  Genitourinary: Negative for urgency and frequency.  Musculoskeletal: Negative for joint swelling and arthralgias.  Skin: Negative for color change and rash.  Neurological: Negative for weakness and light-headedness.  Hematological: Negative for adenopathy.  Psychiatric/Behavioral: Negative for behavioral problems.    Past Medical History  Diagnosis Date  . Acute prostatitis 02/04/2008  . ATRIAL MYXOMA 02/04/2008  . Closed fracture of four ribs 11/01/2009  . HYPERLIPIDEMIA 02/04/2008  . HYPERTENSION 02/04/2008  . LOC OSTEOARTHROS NOT SPEC PRIM/SEC LOWER LEG 04/18/2010  . UNS ADVRS EFF UNS RX MEDICINAL&BIOLOGICAL SBSTNC 02/04/2008   Past Surgical History  Procedure Date  . Removal of atrial myxoma   . Cardio version for a fibi     reports that he has never smoked. He does not have any smokeless tobacco history on file. He reports that he does not drink alcohol or use  illicit drugs. family history includes Heart disease in his father; Hypertension in his father; and Stroke in his mother. No Known Allergies  Objective:   Physical Exam  Constitutional: He appears well-developed and well-nourished.  HENT:  Head: Normocephalic and atraumatic.  Eyes: Conjunctivae are normal. Pupils are equal, round, and reactive to light.  Neck: Normal range of motion. Neck supple.  Cardiovascular: Normal rate and regular rhythm.   Pulmonary/Chest: Effort normal and breath sounds normal.  Abdominal: Soft. Bowel sounds are normal.          Assessment & Plan:  If the patient calls to the Prilosec is not helping his cough would change the Prinivil tonight a strong since the Prinivil may be the etiology of his cough The patient he'll spur was treated with an injection in the knee pain will be treated with Celebrex daily for a one-month trial. He will come back in one month to consider knee injection and also to consider possible change of his antihypertensive medication if the cough persists

## 2011-04-16 NOTE — Assessment & Plan Note (Signed)
[facility]                         ELECTROPHYSIOLOGY OFFICE NOTE   COE, ANGELOS                      MRN:          [id number]  DATE:05/06/2007                            DOB:          1946/11/22    Mr. Nyland returns today for follow-up.  He is a very pleasant man with  a left atrial myxoma and coronary disease status post resection and  bypass surgery.  He also has a history of atrial arrhythmias and  underwent Cox-Maze surgery with a postoperative atrial flutter which has  now resolved.  He returns today for follow-up and overall has felt well.  He denies palpitations.  He denies chest pain.  He denies shortness of  breath.  He remains hypertensive.   PHYSICAL EXAMINATION:  GENERAL APPEARANCE:  He is a pleasant, well-  appearing man in no acute distress.  VITAL SIGNS:  Blood pressure today was 170/100, on repeat it was  160/108.  The pulse was 65 and regular.  Respirations were 18.  Weight  was 175 pounds.  NECK:  No jugular venous distension.  LUNGS:  Clear bilaterally to auscultation.  No wheezing, rhonchi or  rales present.  CARDIOVASCULAR:  Regular rate and rhythm with normal S1 and S2.  No  murmurs, rubs, or gallops.  EXTREMITIES:  No edema.   MEDICATIONS:  1. Lipitor.  2. Coumadin.  3. Metoprolol 50 twice a day.  4. Lisinopril 10 daily.   IMPRESSION:  1. Postoperative atrial flutter now resolved.  2. Hypertension.  3. Dyslipidemia.  4. History of coronary artery disease.  5. Left atrial myxoma status post resection.   DISCUSSION:  Overall, Mr. Febo is stable and he has maintained a  sinus rhythm very nicely.  His blood pressure is increased although  today he brought me a list of his blood pressure readings since February  and for the most part he has been very minimally hypertensive if not  normotensive.  For this reason, I am not going to change his medicines  today.  I will plan to see him back in six  months.     Doylene Canning. Ladona Ridgel, MD  Electronically Signed    GWT/MedQ  DD: 05/06/2007  DT: 05/06/2007  Job #: [id number]

## 2011-04-16 NOTE — Assessment & Plan Note (Signed)
[facility]                         ELECTROPHYSIOLOGY OFFICE NOTE   Ethan Payne, Ethan Payne                      MRN:          [id number]  DATE:09/19/2008                            DOB:          [birth year]    Ethan Payne returns today for followup.  He is a very pleasant 65-year-  old man with a history of left atrial myxoma status post resection and  history of atrial fibrillation secondary to his myxoma.  Postoperatively, he has some atrial fibrillation which has resolved.  He  has been in sinus rhythm as best we can tell for 2 years having no  symptomatic palpitations or other evidence of arrhythmia.  A year ago,  we stopped his Coumadin, and he has been maintained on aspirin since.  He had no specific complaints today.  He denied chest pain.  He denied  shortness of breath.  He is concerned about the economy and not been  able to work much lately.   MEDICATIONS:  1. Lipitor 10 a day.  2. Metoprolol 50 twice a day.  3. Aspirin 325 a day.  4. Lisinopril 10 mg daily.   PHYSICAL EXAMINATION:  GENERAL:  He is a pleasant well-appearing man in  no distress.  VITAL SIGNS:  Blood pressure was 149/83, the pulse 75 and regular,  respirations were 18, the weight was 175 pounds.  NECK:  No jugular venous distention.  LUNGS:  Clear bilaterally to auscultation.  No wheezes, rales, or  rhonchi are present.  No increased work of breathing.  CARDIOVASCULAR:  Regular rate and rhythm.  Normal S1 and S2.  There are  no murmurs, rubs, or gallops.  ABDOMINAL:  Soft and nontender.  There is no organomegaly.  EXTREMITIES:  No cyanosis, clubbing, or edema.  Pulses are 2+ and  symmetric.   EKG demonstrates sinus rhythm with normal axis and intervals.   IMPRESSION:  1. History of left atrial myxoma status post resection.  2. Atrial fibrillation, now maintaining sinus rhythm with his atrial      fibrillation occurring postoperatively.   DISCUSSION:  Ethan Payne is  stable at the present time and he is  maintaining sinus rhythm very nicely.  We will plan to see the patient  back in the office for follow up of his atrial fibrillation as needed.     Doylene Canning. Ladona Ridgel, MD  Electronically Signed   GWT/MedQ  DD: 09/19/2008  DT: 09/20/2008  Job #: [id number]

## 2011-04-16 NOTE — Assessment & Plan Note (Signed)
[facility]                         ELECTROPHYSIOLOGY OFFICE NOTE   Ethan Payne, Ethan Payne                      MRN:          [id number]  DATE:11/05/2007                            DOB:          1946/08/15    [name] returns here for followup.   He is a 65 year old man with a history of left atrial myxoma and atrial  fibrillation, status post left atrium myxoma resection with a maze  procedure.  The patient presented initially with Afib in the setting of  his myxoma.  He had one episode of atrial flutter following his maze  procedure but he has had none since and underwent DC cardioversion last  November.  He has been stable since then.  He is back to working  regularly.  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  He denies chest pain or shortness of breath.   PHYSICAL EXAMINATION:  GENERAL:  He is a pleasant, well-appearing , 73-  year-old in no acute distress.  VITAL SIGNS:  The blood pressure today was 132/88.  The pulse was 80 and  regular.  The respirations were 18.  Weight was 180 pounds.  NECK:  Revealed no jugular venous distention.  LUNGS:  Clear bilaterally to auscultation.  No wheezes, rales, or  rhonchi were present.  CARDIOVASCULAR:  Revealed a regular rate and rhythm with normal S1 and  S2.  EXTREMITIES:  Demonstrated no edema.   IMPRESSION:  1. Left atrial myxoma status post resection.  2. Atrial fibrillation associated with number one.  3. Well controlled hypertension.   DISCUSSION:  [name] is stable.  He has had no symptomatic atrial  arrhythmias.  He is now a year out from his DC cardioversion and  continues to do quite well.  Though he does have hypertension and has  had mild coronary disease, I think that today we can stop his Coumadin  and start him on full strength aspirin.  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  For this reason, his  risk for falls and potential bleeding from falls is quite high.  I have  asked that he take full strength aspirin and discontinue his Coumadin.   We will see him back in the office in 6 months.     Doylene Canning. Ladona Ridgel, MD  Electronically Signed    GWT/MedQ  DD: 11/05/2007  DT: 11/05/2007  Job #: [id number]

## 2011-04-19 NOTE — Consult Note (Signed)
NAMELUDWIN, FLAHIVE               ACCOUNT NO.:  [id number]   MEDICAL RECORD NO.:  [id number]          PATIENT TYPE:  INP   LOCATION:  2915                         FACILITY:  [facility]   PHYSICIAN:  Salvatore Decent. Cornelius Moras, M.D. DATE OF BIRTH:  Nov 29, 1946   DATE OF CONSULTATION:  09/26/2006  DATE OF DISCHARGE:                                   CONSULTATION   REQUESTING PHYSICIAN:  Dr. Nicholes Mango.   PRIMARY CARDIOLOGIST:  Dr. Lewayne Bunting.   PRIMARY CARE PHYSICIAN:  Dr. Darryll Capers.   REASON FOR CONSULTATION:  Left atrial mass.   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 His past medical history is notable only for history of  hypertension and hyperlipidemia.  He describes a two to three-month history  of tachycardia associated with worsening exertional shortness of breath as  well as occasional episodes of shortness of breath and dizziness that seem  to be positional and exacerbated when lying supine in bed or crouching, or  leaning over forward.  These symptoms have progressed recently and initially  Ethan Payne attributed this to a new medication he had been started on for  blood pressure control.  He presented to Dr. Lovell Sheehan' office, where an  electrocardiogram was performed demonstrating rapid atrial fibrillation.  He  was promptly hospitalized yesterday, started on intravenous heparin, and  pharmacologic treatment to control his heart rate.  He underwent a 2-D  echocardiogram earlier today, that revealed a large mass in the left atrium  arising from the interatrial septum that intermittently protrudes through  the mitral valve with each heart beat.  This large, lobulated, pedunculated  mass has growth characteristics classical for a left atrial myxoma.  Left  ventricular function is mildly reduced with ejection fraction estimated at  30-35%.  Ethan Payne  subsequently underwent left and right heart  catheterization by Dr. Antoine Poche.  He was found to have 60-70% proximal  stenosis of a large circumflex marginal branch with otherwise no significant  coronary artery atherosclerosis.  Right heart pressures were minimally  elevated and cardiac output was normal.  Cardiac surgical consultation has  been requested.   REVIEW OF SYSTEMS:  GENERAL:  The patient reports a normal appetite.  He has  not been gaining or losing weight recently.  He does admit to worsening  fatigue.  CARDIAC:  Notable for the absence of any symptoms of chest pain or  chest tightness either with activity or at rest.  The patient has had these  occasional dizzy spells but he denies any syncopal episodes.  He has had  some orthopnea and lower extremity edema.  He denies any resting shortness  of breath.  He does have exertional shortness of breath.  RESPIRATORY:  Notable for the absence of productive cough, hemoptysis, wheezing.  GASTROINTESTINAL:  Negative.  The patient has no difficulty swallowing.  He  denies hematochezia, hematemesis, melena.  MUSCULOSKELETAL:  Notable only  for some mild generalized arthralgias which are very mild and subclinical.  The patient has no significant physical limitations.  NEUROLOGIC::  Notable  only for the transient dizzy spells.  The patient denies transient monocular  blindness and the patient also denies any transient numbness or weakness  involving either upper or lower extremity.  GENITOURINARY:  Negative.  INFECTIOUS:  Negative.  The patient does report some night sweats recently.  HEMATOLOGIC:  Negative.  The patient denies bleeding diathesis or easy  bruising.  PSYCHIATRIC:  Negative.  The patient does have history of ocular  migraine headaches that occur once or twice a week.  The patient also  reports symptoms of period of apnea when he is sleeping at night that his  wife notes quite promptly.  The patient sometimes will be noted  to wake up  gasping for his breath after somewhat prolonged periods of apnea.  The  patient reports that he has snored in the past but denies this being an  issue recently.  His wife corroborates this.   PAST SURGICAL HISTORY:  None.   FAMILY HISTORY:  Noncontributory.   SOCIAL HISTORY:  The patient is married and lives with his wife here in  [place].  He remains active and fully employed as noted previously.  The  patient is a nonsmoker.  He denies excessive alcohol consumption.   MEDICATIONS PRIOR TO ADMISSION:  Caduet 5/10, one tablet daily.   DRUG ALLERGIES:  NONE KNOWN.   PHYSICAL EXAMINATION:  GENERAL:  The patient is a well-appearing white male  who appears his stated age in no acute distress.  VITAL SIGNS:  He is currently in atrial fibrillation with heart rate 110-  120.  He is afebrile.  HEENT:  Grossly unremarkable.  LYMPHATICS:  There is no cervical nor supraclavicular lymphadenopathy.  NECK:  There is no jugular venous distension.  No carotid bruits are noted.  CHEST:  Auscultation of the chest demonstrates clear and symmetrical breath  sounds bilaterally.  No wheezes or rhonchi noted.  CARDIOVASCULAR:  Includes irregular heart rhythm which is somewhat  tachycardiac.  No murmur, rubs or tumor plops are noted.  ABDOMEN:  Soft, nontender.  There are no palpable masses.  EXTREMITIES:  Warm and well-perfused.  There is no lower extremity edema.  Distal pulses are palpable in both lower legs at the ankle.  There is no  venous insufficiency.  SKIN:  Clean, dry, healthy-appearing throughout.  RECTAL/GU:  Deferred.  NEUROLOGIC:  Grossly nonfocal.   DIAGNOSTIC TESTS:  A 2-D echocardiogram performed today is reviewed and  notable for findings as discussed previously.  This patient has a large  pedunculated mass arising from the interatrial septum in the left atrium that has gross characteristics classical for a left atrial myxoma.  There is  mild left ventricular  dysfunction.  No other significant abnormalities are  noted.  There is some mild mitral regurgitation, although this seems to be  primarily related to the tumor itself that intermittently traverses through  the mitral valve.  Cardiac catheterization performed today is also reviewed.  This demonstrates 60-70% proximal stenosis of a very large first circumflex  marginal branch.  There is 30% proximal stenosis of the left anterior  descending coronary artery.  There are otherwise luminal irregularities and  no significant coronary artery disease.  PA pressures were measured 31/15  with pulmonary capillary wedge pressure of 19, and cardiac output of 6.3  liters per minute, corresponding to a cardiac index of 3.2.   IMPRESSION:  1. Large left atrial mass, likely representing a benign  atrial myxoma.  2. Persistent atrial fibrillation that has probably been ongoing for at      least two to three months and now associated with mild left ventricular      dysfunction that likely represents tachycardia-induced cardiomyopathy.   I believe that Mr. Dimaano would best be treated by surgical intervention  for resection of his left atrial myxoma, single-vessel coronary artery  bypass grafting to consist of a saphenous vein graft to the circumflex  marginal branch, and a concomitant maze procedure in an effort to eliminate  the risk of long-term recurrence of atrial tachy arrhythmias.   PLAN:  I have discussed options at length with Mr. Moening here and his  family.  Alternative treatment strategies have been discussed.  They  understand and accept all associated risks of surgery including, but not  limited to, risk of death, stroke, myocardial infarction, congestive heart  failure, respiratory failure, pneumonia, bleeding requiring blood  transfusion, arrhythmia, heart block, or bradycardia requiring permanent  pacemaker, or late recurrence of another atrial myxoma.  All their questions  have been  addressed.  We tentatively plan to proceed with surgery on  Tuesday, October30.  During the interim period of time, I favor beginning  amiodarone treatment as Mr. Pinedo will remain at very high risk for  recurrent atrial tachy arrhythmias during the early period postoperatively.      Salvatore Decent. Cornelius Moras, M.D.  Electronically Signed     CHO/MEDQ  D:  09/26/2006  T:  09/27/2006  Job:  [id number]   cc:   Bevelyn Buckles. Bensimhon, MD  Doylene Canning. Ladona Ridgel, MD  Stacie Glaze, MD

## 2011-04-19 NOTE — Assessment & Plan Note (Signed)
Ethan Payne                         ELECTROPHYSIOLOGY OFFICE NOTE   Ethan Payne, Ethan Payne                      MRN:          [id number]  DATE:10/29/2006                            DOB:          04-Jul-1946    Ethan Payne returns today for followup.  He is a very pleasant middle-  aged man who was recently admitted to the hospital with atrial  fibrillation and rapid ventricular response and evidence of tachycardia-  induced cardiomyopathy.  The patient subsequently was found to have a  left atrial myxoma and coronary disease, for which he underwent a  modified Cox Maze, left atrial myxoma removal with placement of a  pericardial patch in the intra-atrial septum, and bypass surgery x2 with  the saphenous vein graft to the ramus intermedius and the circumflex.  The patient had done well following surgery but was found to have  recurrent atrial flutter and was seen by Dr. Cornelius Moras in the office  yesterday.  Unlike his previous atrial flutter, which appeared to be  typical, this was an atypical-appearing flutter.  The patient states  that he has had no palpitations, shortness of breath or syncope and  overall feels well.   He has been on Coumadin now with a therapeutic INR for over a week.   MEDICATIONS:  Lipitor, Coumadin, lisinopril, metoprolol 50 twice daily.   PHYSICAL EXAMINATION:  GENERAL:  He is a pleasant, well-appearing middle-  aged man in no acute distress.  VITAL SIGNS:  Blood pressure 128/78, pulse 120 and regular, respirations  18.  Weight was 163 pounds.  NECK:  No jugular venous distention.  LUNGS:  Clear bilaterally to auscultation.  CARDIOVASCULAR:  Regular tachycardia with normal S1 and S2.  EXTREMITIES:  No edema.   The EKG demonstrates atypical flutter with 2:1 AV conduction.   IMPRESSION:  1. Atrial flutter following Maze surgery.  2. Left atrial myxoma, status post excision.  3. Status post bypass surgery.   DISCUSSION:   Overall, Ethan Payne is stable, although he is back in  atrial flutter.  This does not appear to be typical flutter but  atypical, arising from the left atrium.  I have recommended that we  proceed with DC cardioversion once he has had three weeks of therapeutic  anticoagulation.  He will continue on his beta blocker.  Should he have  shortness of breath, chest pain, or syncope, he is instructed to call  our office or go to the emergency room.  My plan will be to continue him  on Coumadin for several months after month DC cardioversion.  If he does  not  maintain sinus rhythm after cardioversion, then consideration would be  made for antiarrhythmic drug therapy and/or catheter ablation of this  flutter.     Doylene Canning. Ladona Ridgel, MD  Electronically Signed    GWT/MedQ  DD: 10/29/2006  DT: 10/29/2006  Job #: [id number]   cc:   Salvatore Decent. Cornelius Moras, M.D.  Stacie Glaze, MD

## 2011-04-19 NOTE — Op Note (Signed)
Ethan Payne, Ethan Payne               ACCOUNT NO.:  [id number]   MEDICAL RECORD NO.:  [id number]          PATIENT TYPE:  INP   LOCATION:  2313                         FACILITY:  [facility]   PHYSICIAN:  Salvatore Decent. Cornelius Moras, M.D. DATE OF BIRTH:  [birth year]   DATE OF PROCEDURE:  09/30/2006  DATE OF DISCHARGE:                                 OPERATIVE REPORT   PREOPERATIVE DIAGNOSES:  1. 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  2. Persistent atrial fibrillation.  3. Single-vessel coronary artery disease.  4. Tachycardia induced cardiomyopathy.   POSTOPERATIVE DIAGNOSES:  1. 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  2. Persistent atrial fibrillation.  3. Single-vessel coronary artery disease.  4. Tachycardia induced cardiomyopathy.   PROCEDURE:  Median sternotomy for resection of left atrial myxoma with  pericardial patch reconstruction of the interatrial septum, modified Cox  maze IV procedure coronary artery bypass grafting x2 (saphenous vein graft  to ramus intermediate branch with sequential saphenous vein graft to  circumflex marginal branch and, endoscopic saphenous vein harvest from right  thigh).   SURGEON:  Dr. Salvatore Decent. Cornelius Moras   ASSISTANT:  Ms. Charlesetta Garibaldi   ANESTHESIA:  General.   BRIEF CLINICAL NOTE:  The patient is a 65 year old white male from  [place] with no previous cardiac history, who describes a 2-3 months  history of tachycardia associated with worsening exertional shortness of  breath as well as occasional episodes of severe shortness of breath and  weakness that seem to be positional in nature.  He presented to Dr. Darryll Capers' office and was noted be in rapid atrial fibrillation.  He was  promptly hospitalized where he was evaluated by Dr. Lewayne Bunting.  He was  started on intravenous heparin and given intravenous medication to control  his heart rate.  A 2-D echocardiogram was performed, demonstrating a large  mass in the left atrium  emanating from the interatrial septum with gross  characteristics consistent with the left atrial myxoma.  This large mass was  protruding through the mitral valve into the left ventricle.  There was  notably moderate left ventricular dysfunction with moderate global left  ventricular hypokinesis.  Cardiac catheterization was performed  demonstrating 60-70% proximal stenosis of a very large first circumflex  marginal branch and 70-80% proximal stenosis of a small ramus intermediate  branch.  There was otherwise no significant coronary atherosclerotic plaque.   Full consultation note has been dictated previously.  The patient and his  family have been counseled at length regarding the indications, risks, and  potential benefits of surgery.  Alternative treatment strategies have been  discussed.  They understand and accept all associated risks of surgery  including but not limited to risk of death, stroke, myocardial infarction,  congestive heart failure, respiratory failure, pneumonia, bleeding requiring  blood transfusion, arrhythmia, heart block or bradycardia requiring  permanent pacemaker, recurrence of atrial myxoma, recurrence of significant  coronary artery disease, recurrence of atrial fibrillation.  All their  questions have been addressed.   OPERATIVE FINDINGS:  1. Large left atrial mass consistent with atrial myxoma.  2. Moderate to  severe left ventricular dysfunction with ejection fraction      estimated 25-30%   OPERATIVE NOTE IN DETAIL:  The patient was brought to the operating room on  the above-mentioned date, and central monitoring is established by the  anesthesia service under the care and direction of Dr. Hart Robinsons.  Specifically, a Swan-Ganz catheter was placed through the right internal  jugular approach.  A radial arterial line was placed.  Intravenous  antibiotics are administered.  Following induction with general endotracheal  anesthesia, a Foley  catheter is placed.  The patient's chest, abdomen, both  groins, and both lower extremities are prepared and draped in a sterile  manner.   Baseline transesophageal echocardiogram is performed by Dr. Gelene Mink.  This  confirms the presence of the large mass emanating from the interatrial  septum and protruding through the mitral valve into the left ventricle.  The  gross findings of this mass are consistent with an atrial myxoma.  There is  no sign of mural thrombus or clot within the left atrial appendage.  There  is mild mitral regurgitation associated with this mass protruding through  the mitral valve.  The aortic valve appears normal.  There is moderate to  severe left ventricular dysfunction with global hypokinesis and ejection  fraction estimated 25-30%.  No other significant abnormalities are noted.   Greater saphenous vein is removed from the patient's right thigh using  endoscopic vein harvest technique through a small incision made just above  the right knee.  The saphenous vein is notably good-quality conduit.  After  the saphenous vein is removed, the small surgical incision is closed in  multiple layers with running absorbable suture.   A median sternotomy incision is performed.  The pericardium is opened  laterally, and a portion of the pericardium is trimmed, removed from the  patient, tanned briefly in Cidex solution for a total of 3 minutes and  subsequently rinsed in saline and stored in saline to subsequently be  utilized later as a patch during reconstruction of the interatrial septum.   The patient is heparinized systemically.  The ascending aorta is normal in  appearance.  The ascending aorta is cannulated for cardiopulmonary bypass.  A venous cannula is placed directly in the superior vena cava.  The second  venous cannula is placed low in the right atrium with tip extending down the inferior vena cava.  Adequate heparinization is verified.  Cardiopulmonary   bypass is begun with care to avoid touching the heart or manipulating it  substantially for fear of the potential for causing embolization.  Vessel  loops were placed around the superior vena cava and the inferior vena cava.  A cardioplegic catheter is placed in the ascending aorta.  The temperature  probe is placed in the left ventricular septum.   The patient is cooled to 32 degrees systemic temperature.  The aortic  crossclamp is applied, and cold blood cardioplegia is administered in  antegrade fashion through the aortic root.  Iced saline slush is applied for  topical hypothermia.  The initial cardioplegic arrest and myocardial cooling  is felt to be excellent.  Repeat doses of cardioplegia are administered  intermittently throughout the crossclamp portion of the operation through  the aortic root to maintain left ventricular septal temperature below 15  degrees centigrade.   A left atriotomy incision is performed posteriorly through the interatrial  groove.  Through this incision, the large left atrial mass is visualized and  can be seen emanating  from the interatrial septum.  The vessel loops are  secured around the superior vena cava and the inferior vena cava.  A  generous oblique right atriotomy incision is now performed.  An incision is  now made through the interatrial septum with care to visualize that this  incision is made just posterior and lateral to the edge of the atrial myxoma  itself.  The mass is visualized through the posterior left atriotomy  incision while this second incision through the interatrial septum is  performed.  Once this atriotomy incision is performed, the mass is resected  by resecting a generous portion of the interatrial septum with a margin of 3-  5 mm circumferentially around the stalk of this myxoma.  The circular patch  of the interatrial septum measures approximately 3 cm in diameter.  Once  that has been completely excised, the atrial myxoma  is carefully retracted  and removed from the body.  The myxoma is sent to pathology fresh in saline  for a routine histology.   The heart is now retracted towards the surgeon's side to expose the left-  sided pulmonary veins.  The left-sided pulmonary veins are encircled and the  Medtronic cardia blade irrigated radiofrequency bipolar ablation device is  utilized to create an elliptical lesion around the base of the left-sided  pulmonary veins.  After this is completed, similar elliptical lesion is  created across the base of the left atrial appendage.  Subsequently, the  Medtronic cardia blades unipolar handheld irrigated radiofrequency ablation  pen is utilized to create a linear lesion connecting the ellipse surrounding  the left-sided pulmonary veins with the elliptical lesion surrounding the  base of the left atrial appendage.   The following distal coronary bypass grafts are performed: 1. The ramus intermediate branch is grafted with a saphenous vein graft in      a side-to-side fashion.  This vessel measured 1.5 mm in diameter and is      notably larger than one would have appreciated based on the preop      cardiac catheterization.  It is good quality at the site of distal      bypass.  2. The circumflex marginal branch is grafted using a sequential vein graft      off of the vein placed at the ramus intermediate branch.  This vessel      measures 2.2 mm in diameter and is good quality at the site of distal      bypass.   The floor of the left atrium is now exposed using the self-retaining  retractor.  The Medtronic cardia Blake bipolar ablation device is utilized  to create an elliptical lesion surrounding the base of the right-sided  pulmonary veins.  The left atriotomy incision serves as the anterior half of  this ellipse, and the remainder of the ellipse is completed with 1 limb of  the bipolar device along the endocardial surface and the other limb along  the epicardial  surface posterior to the heart.  A longitudinal lesion is now  created with the bipolar device across the roof of the left atrium,  beginning at the cephalad apex of the atriotomy incision, extending across  the roof to reach the cephalad apex of the elliptical lesion surrounding the  left-sided pulmonary veins.  A similar parallel lesion is created across the  back wall of the left atrium from the caudad apex of the atriotomy incision  across the back wall to reach the caudad apex  of the elliptical lesion  surrounding the left-sided pulmonary veins.  Another bipolar lesion is then  created from the caudad apex of the atriotomy incision across the back wall  of left atrium towards the mitral valve annulus as far as it will reach.  This lesion is completed all the way to the mitral valve annulus using the  unipolar handheld pen along the endocardial surface of the heart.  This  completes the left-sided lesion set of the Cox maze procedure.   A linear ablation lesion is now created from the posterior apex of the right  atriotomy incision along the back wall of the right atrium onto the  posterior surface of the superior vena cava.  A similar lesion is created in  the opposite direction from the posterior apex of the atriotomy incision  towards the inferior vena cava.  Another linear lesion is then created from  the posterior apex of the right atriotomy incision across the interatrial  septum to reach the rim of the large circular defect in the interatrial  septum.  This is completed with 1 limb of the bipolar device in the left  atrium and the other limb in the right atrium.  The tip of the right atrial  appendage is amputated.  A linear lesion is now created from the tip of the  right atrial appendage extending in an oblique direction perpendicular to  the large right atriotomy incision.  Two centimeters of atrial tissue is left at the end of this lesion intervening.  One final bipolar  lesion is  created from the tip of the right atrial appendage across the anterior  medial surface of the right atrial appendage towards the acute margin of the  heart.  The unipolar handheld ablation pen is now utilized to complete the  remainder of the right-sided lesion set of the Cox maze procedure.  Initially, a linear lesion is created from the large circular defect in the  interatrial septum across the posterior rim of the coronary sinus and across  the isthmus of the right atrium to reach the posterior rim of the tricuspid  valve annulus.  This lesion is again created back across the isthmus of the  heart to reach the inferior vena cava along the medial surface.  Another  short linear lesion is created from the anterior apex of the large right  atriotomy incision towards the anterior lateral rim of the tricuspid  annulus.  One final lesion is created from the apex of the previous bipolar  lesion along the anterior medial surface of acute margin of the heart to  reach to the tricuspid annulus along the anterior medial surface.  This  completes the entire right-sided lesion set of the Cox maze procedure.   The left atrial appendage is oversewn from within the left atrium using a  two-layer closure of running 3-0 Prolene suture.  The interatrial septum is  now closed using a circular patch of the patient's autologous pericardium  which has been prepared with Cidex solution and rinsed.  The circular patch  is sewed in place using running 4-0 Prolene suture.  The posterior left  atriotomy incision is now closed using a two-layer closure of running 3-0  Prolene suture.  The single proximal saphenous vein anastomoses is performed  directly to the ascending aorta prior to removal of the aortic crossclamp.  The aortic crossclamp is removed after the lungs are ventilated and the  heart allowed to fill to evacuate any residual air  through the aortic root.  The aortic crossclamp time is 127  minutes.   The heart began to beat spontaneously without need for cardioversion.  The 2  right atriotomy incisions are now closed using two-layer closure of running  4-0 Prolene suture.  All proximal and distal coronary anastomoses are  inspected for hemostasis and appropriate graft orientation.  Epicardial  pacing wires are fixed to the right ventricular outflow tract into the right  atrial appendage.  The patient is rewarmed to 37 degrees centigrade  temperature.  Low-dose dopamine infusion is begun.  Normal sinus rhythm  resumes spontaneously.  The IVC cannula is removed and its cannulation site  oversewn with Prolene suture.   The patient is weaned from cardiopulmonary bypass without difficulty.  The  patient's rhythm at separation from bypass is normal sinus rhythm.  The  patient is weaned from bypass on dopamine at 5 mcg/kg per minute.  Total  cardiopulmonary bypass time for the operation is 161 minutes.  Follow-up transesophageal echocardiogram performed by Dr. Gelene Mink after  separation from bypass demonstrates no significant change in left  ventricular function.  The left atrial mass is gone.  There is no residual  leak across the interatrial septum.  There is trivial mitral regurgitation,  and the mitral valve appears normal.  No other significant abnormalities are  noted..  There is some mild residual left ventricular air, and a Magoon  needle is placed in the ascending aorta to serve as a root vent.  Once the  area is cleared using echo-guidance, the Magoon needle is removed.  SVC  cannula is removed.  The aortic cannula is removed uneventfully.  Protamine  is administered to reverse the anticoagulation.   The mediastinum is irrigated with saline solution containing vancomycin.  Meticulous surgical hemostasis is ascertained.  The mediastinum is drained  using 2 chest tubes with a third chest tube in the right pleural space.  The  pericardium and soft tissues anterior to  the aorta are reapproximated  loosely.   The On-Q continuous pain management system is utilized to facilitate  postoperative pain control.  Two 10-inch Silastic catheters supplied with  the On-Q kit are tunneled into the deep subcutaneous tissues and positioned  just lateral to the lateral border of the sternum on either side.  Each  catheter is flushed with 5 mL of 0.5% bupivacaine solution and ultimately  connected to a continuous infusion pump.  The sternum is closed with single  strength sternal wire.  The soft tissues anterior to the sternum are closed  in multiple layers, and the skin is closed with a running subcuticular skin  closure.   The patient tolerated the procedure well and is transported to the surgical  intensive care unit in stable condition.  There are no intraoperative  complications.  All sponge, instrument, and needle counts are verified  correct at the completion of the operation.  No blood products were  administered.      Salvatore Decent. Cornelius Moras, M.D.  Electronically Signed     CHO/MEDQ  D:  09/30/2006  T:  09/30/2006  Job:  [id number]   cc:   Doylene Canning. Ladona Ridgel, MD  Cristi Loron, M.D.  Bevelyn Buckles. Bensimhon, MD

## 2011-04-19 NOTE — Discharge Summary (Signed)
Ethan Payne, Ethan Payne NO.:  [id number]   MEDICAL RECORD NO.:  [id number]          PATIENT TYPE:  INP   LOCATION:  2013                         FACILITY:  [facility]   PHYSICIAN:  Salvatore Decent. Cornelius Moras, M.D. DATE OF BIRTH:  Jul 20, 1946   DATE OF ADMISSION:  09/25/2006  DATE OF DISCHARGE:                                 DISCHARGE SUMMARY   PRIMARY DIAGNOSES:  1. Left atrial myxoma, benign.  2. New onset atrial fibrillation.  3. Single vessel coronary artery disease.  4. Tachycardia-induced cardiomyopathy.   IN-HOSPITAL DIAGNOSES:  1. Acute blood loss anemia postoperatively.  2. Class II systolic congestive heart failure.   SECONDARY DIAGNOSES:  1. Hypertension.  2. Hyperlipidemia.   OPERATIONS AND PROCEDURES:  1. Cardiac catheterization, left and right heart catheterization, coronary      arteriography.  2. A 2-D echocardiogram.  3. Section of left atrial myxoma pericardial patch reconstruction of the      interatrial septum.  4. Modified Cox maze fluoroscopic procedure.  5. Coronary artery bypass grafting x2 using a saphenous vein graft to      ramus intermediate branch with sequential saphenous vein graft to      circumflex marginal branch.  6. Endoscopic saphenous vein harvest from right leg.   THE PATIENT'S HISTORY AND PHYSICAL AND HOSPITAL COURSE:  The patient is a 65-  year-old Caucasian male with no previous cardiac history.  He describes a 2-  3 months history of tachycardia associated with worsening exertional  shortness of breath as well as occasional episodes of severe shortness of  breath and weakness and seen to be positional in nature.  He presented Dr.  Lovell Sheehan' office was noted to be in rapid atrial fibrillation.  The patient  was promptly hospitalized and then evaluated by Dr. Ladona Ridgel.  He was  initially started on intravenous heparin and intravenous medication to  control his heart rate.  A 2-D echocardiogram was done which demonstrated  large  mass in left atrium emanating from the interatrial septum with gross  consistent with left atrial myxoma.  Large mass was shown to be protruding  through the mitral valve into the left ventricle.  There is moderate left  ventricular dysfunction with moderate global left ventricular hypokinesis.  The patient was then taken for cardiac catheterization.  This was performed  by Dr. Antoine Poche.  This showed 60-70% proximal stenosis of a very large first  circumflex marginal branch and 70-80% proximal stenosis of a small ramus  intermediate branch.  Following catheterization, Dr. Cornelius Moras was consulted.  Dr. Cornelius Moras evaluated 2-D echocardiogram and catheterization.  Following  evaluation, he discussed with the patient undergoing surgery.  He discussed  risks and benefits with the patient.  The patient acknowledge understanding  and agreed to proceed.  Surgery was scheduled for September 30, 2006.  For  details of the patient's Past Medical History and Physical Examination,  please see dictated History and Physical.  The patient did remained stable  prior to undergoing surgery.   The patient was taken to the operating room September 30, 2006, where he  underwent resection left  atrial myxoma with pericardial patch reconstruction  of the interatrial septum.  He also underwent modified Cox maze 4 procedure  and coronary artery bypass grafting x2 using a saphenous vein graft to ramus  intermediate branch with sequential saphenous vein.  The pathology report on  the left atrial myxoma came back benign.  On the patient tolerated this  procedure and was transferred to the intensive care unit in stable  condition.   Following surgery, the patient was seen to be hemodynamically stable.  He  was extubated late evening following surgery.  Following extubation, the  patient was seen to be alert and oriented x4.  The patient's postoperative  course was pretty much unremarkable.  Vital Signs seen to be stable. The   patient was off drip.  Swan-Ganz was able to be discontinued.  Chest tubes  were also discontinued postop day #1 due to stable chest x-ray and minimal  chest tube output.   Postop day #2, the patient was out of bed and ambulating well.  Vital signs  remained stable.  He was started on low-dose beta blocker.  The patient was  transferred up to 2000 postop day #2.  He was noted to have blood loss  anemia.   On postop day #3, hemoglobin 10.0, hematocrit  29.5.  The patient was  asymptomatic.  This was followed closely.  The patient remained in normal  sinus rhythm following maze procedure.  He was started on Coumadin.  Daily  PT and INRs were monitored.  Coumadin was adjusted appropriately.  The  patient was also continued on blood pressure.  During the patient's  postoperative course, he remained afebrile.  He was able to be weaned off  oxygen, saturating greater than 90% on room air.  His incisions were clean,  dry and intact and healing well.  Again, the patient was out of bed  ambulating well.  Bowel movements within normal limits.  Lab results postop  day #3 were white count 9.8, hemoglobin 10.0, hematocrit 29.5, platelet  count 128.  Sodium of 136, potassium 4.4, chloride of 103, bicarb of 27, BUN  13, creatinine 1.0, glucose of 107.  Currently INR is pending.  Therapeutic  goal is 2.0-3.0.  Coumadin adjusted appropriately.   The patient is tentatively ready for discharge home in the next 1-2 days.   FOLLOWUP APPOINTMENTS:  A followup appointment has been arranged with Dr.  Cornelius Moras for October 27, 2006, at 12:30 p.m.  The patient will need to follow  up with Dr. Ladona Ridgel in 2 weeks.  He will need to contact Dr. Lubertha Basque office  to schedule appointment him.  The patient will need to obtain PT/INR blood  work which will be monitored by Dr. Ladona Ridgel 2 days post discharge.   ACTIVITY:  The patient was instructed no driving until to released to do so, no heavy lifting over 10 pounds.  He is  told to ambulate 3-4 times per day,  progress as tolerated, and to continue breathing exercises.   INCISIONAL CARE:  The patient was told he is allowed to shower, washing his  incisions using soap and water.  He is to contact the office if he develops  any drainage or opening from any of his incision sites.   DIET:  The patient was instructed on diet to be low-fat, low-salt.   DISCHARGE MEDICATIONS:  1. Aspirin 81 mg p.o. daily.  2. Lopressor 25 mg p.o. b.i.d..  3. Lipitor 10 mg p.o. nightly.  4. Coumadin will  be dosed prior to the patient's discharge pending INR.      Theda Belfast, PA      Salvatore Decent. Cornelius Moras, M.D.  Electronically Signed    KMD/MEDQ  D:  10/03/2006  T:  10/03/2006  Job:  [id number]   cc:   Doylene Canning. Ladona Ridgel, MD

## 2011-04-19 NOTE — H&P (Signed)
Ethan, Payne NO.:  [id number]   MEDICAL RECORD NO.:  [id number]          PATIENT TYPE:  INP   LOCATION:  2915                         FACILITY:  [facility]   PHYSICIAN:  Doylene Canning. Ladona Ridgel, MD    DATE OF BIRTH:  1946/02/06   DATE OF ADMISSION:  09/25/2006  DATE OF DISCHARGE:                                HISTORY & PHYSICAL   INDICATION FOR ADMISSION:  New-onset atrial fibrillation with rapid  ventricle response associated with congestive heart failure.   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  He was  complaining of dizziness after starting a new antihypertensive medication  (Caduet) was in for a regular medical followup today when he was found to be  in atrial fibrillation with a rapid ventricular response.  He was given  carotid massage in his primary physician's office with slowing of his rate  with immediate reinitiation of his rapid response.  The patient was admitted  for additional evaluation.   In retrospect, the patient feels like he has not felt well for the last 2  months.  His energy level has been down. He gets dyspneic when he exerts  himself.  He has had no syncope.  Denies palpitations.  He denies nausea,  vomiting. Does note that he sweats more profusely than he has at times.   PAST MEDICAL HISTORY:  Is notable for hypertension for over 10 years and  dyslipidemia on Lipitor.  There is also a history remotely of colitis in the  1970s.  He also has a history of difficulty sleeping, though he has never  been diagnosed with sleep apnea.   FAMILY HISTORY:  Is notable for father who died of complications of coronary  disease at age 37 with his first MI at age 65, and a mother who has a  history of hypertension.   SOCIAL HISTORY:  The patient works as a Proofreader.  He denies tobacco or  alcohol use.  He is married.   REVIEW OF SYSTEMS:  Is negative for  hearing problems.  He wears glasses for  visual acuity.  He denies nausea, vomiting, diarrhea, constipation.  He  denies polyuria, polydipsia, heat or cold intolerance, recent weakness,  night sweats, chest pain, PND.  He does have dyspnea on exertion.  He denies  orthopnea.  He has nocturia x2.  He denies any neurologic problems.  The  rest of  the Review of Systems was reviewed and found to be negative.   PHYSICAL EXAMINATION:  GENERAL:  He is a pleasant middle-aged man in no  acute distress.  VITAL SIGNS: Blood pressure was 150/100, pulse was 170 and irregularly  irregular, respirations were 20-22, temperature is 98.  HEENT: Normocephalic and atraumatic.  Pupils equal, round.  Oropharynx was  moist, sclerae anicteric.  The oropharynx is moist.  NECK: Revealed approximately 10 cm jugular venous distension.  The carotids  are 2+ and symmetric.  Trachea is midline.  LUNGS: Revealed rales one-third up bilaterally.  There are no wheezes or  rhonchi.  CARDIOVASCULAR: Irregularly irregular tachycardia.  The PMI was enlarged and  laterally displaced.  Heart sounds were somewhat distant.  ABDOMEN: Soft, nontender, nondistended.  No organomegaly.  There is no  rebound or guarding.  EXTREMITIES: Demonstrated no cyanosis, clubbing or edema.  Pulses were 2+  and symmetric.  NEUROLOGIC:  Alert x3.  Cranial nerves intact.  Strength 5/5 and symmetric.   The EKG demonstrates atrial fibrillation with a rapid ventricular response  and nonspecific ST-T wave abnormality.   IMPRESSION:  1. New-onset atrial fibrillation with rapid ventricular response.  2. Mild congestive heart failure. (I worry about tachycardia-induced      cardiomyopathy.)  3. Hypertension.  4. Dyslipidemia.   DISCUSSION:  Will plan to admit the patient to the hospital and treat him  with intravenous Cardizem.  Will start heparin and Coumadin. Will check  serial cardiac enzymes and EKGs.  If he is unable to have his ventricular   rate and atrial fibrillation controlled, then TEE-guided cardioversion will  be considered.  Will plan to obtain a 2-D echocardiogram and check a TSH.  With give his home amount of IV Lasix for his heart failure symptoms.           ______________________________  Doylene Canning. Ladona Ridgel, MD     GWT/MEDQ  D:  09/25/2006  T:  09/26/2006  Job:  [id number]   cc:   Stacie Glaze, MD

## 2011-04-19 NOTE — Cardiovascular Report (Signed)
Ethan Payne, CASAUS NO.:  [id number]   MEDICAL RECORD NO.:  [id number]          PATIENT TYPE:  INP   LOCATION:  2915                         FACILITY:  [facility]   PHYSICIAN:  Rollene Rotunda, MD, FACCDATE OF BIRTH:  1946-03-26   DATE OF PROCEDURE:  09/26/2006  DATE OF DISCHARGE:                              CARDIAC CATHETERIZATION   PRIMARY CARE PHYSICIAN:  Dr. Darryll Capers.   CARDIOLOGIST:  Dr. Ladona Ridgel.   PROCEDURE:  Left and right heart catheterization/coronary arteriography.   INDICATION:  Patient with newly diagnosed left atrial mass, probably a  myxoma.  He also has cardiomyopathy with an ejection fraction of  approximately of 30%.  He had a new onset atrial fibrillation  (cardiomyopathy).   PROCEDURE:  Left heart catheterization performed via the right femoral  artery, right heart catheterization via the right femoral vein.  Both  vessels are cannulated using arterial puncture.  A number 6-French arterial  sheath and number 7-French venous sheath were inserted via the modified  Seldinger technique.  Preformed Judkins catheters were utilized.  A Schwan-  Ganz catheter was utilized.  The patient tolerated the procedure well and  left the lab in stable condition.   RESULTS:  Hemodynamics:  RA mean 1, RV 31/1, PA 31/15 with a mean of 23.  Pulmonary capillary wedge pressure mean 19.  Cardiac outflow/cardiac index  6.3/3.26, LV and the aortic valve was not crossed, AO 88/65.   Coronaries:  The left mean had 25% stenosis, the LAD had diffuse luminal  irregularities.  There was long proximal 25% stenosis.  First through third  diagonals were small and normal.  The circumflex was a large vessel.  In the  AV grove, there were diffuse luminal irregularities.  There was a ramus  intermediate which was small with ostial 50% stenosis.  There was a very  large mid obtuse margin which had an ostial of 60% to 70% stenosis, followed  by proximal 30% stenosis.  The first  posterolateral was large and normal.  There were 2 or 3 small posterolaterals, which are normal.  The right  coronary artery is a dominant vessel, they are not particularly large.  There is proximal aneurysm with dilatation and diffuse luminal  irregularities.  The PDA was small and normal.   Left ventriculogram:  The left ventricle was not injected, as the aortic  valve was not crossed secondary to the prolapsing atrial myxoma.   CONCLUSION:  Single-vessel coronary artery disease.  Normal right heart and  lung pressures.   PLAN:  The case has been reviewed with Dr. Cornelius Moras who plans to proceed with  resection of the left atrial tumor, as well as single-vessel bypass of the  large obtuse marginal.           ______________________________  Rollene Rotunda, MD, [facility]     JH/MEDQ  D:  09/26/2006  T:  09/27/2006  Job:  [id number]   cc:   Stacie Glaze, MD

## 2011-04-19 NOTE — Assessment & Plan Note (Signed)
[facility]                            CARDIOLOGY OFFICE NOTE   LYNK, MARTI                      MRN:          [id number]  DATE:01/02/2007                            DOB:          07-26-1946    CARDIOLOGIST:  Dr. Doylene Canning. Ladona Ridgel.   PRIMARY CARE PHYSICIAN:  Dr. Stacie Glaze.   HISTORY OF PRESENT ILLNESS:  Mr. Ethan Payne is a 65 year old male patient  followed by Dr. Ladona Ridgel with a history of atrial fibrillation, left  atrial myxoma status post resection, single vessel coronary artery  disease status post CABG x2, and status post modified Cox-Maze procedure  in October of 2007.  In November in followup he was found to have atrial  flutter with rapid ventricular rate and he was referred for  cardioversion.  This was done by Dr. Ladona Ridgel December 13.  He has been  followed in our Coumadin clinic since that time.  He recently saw Dr.  Cornelius Moras in followup who noted that his blood pressure was high and asked  him to follow up here today.  The patient notes that his pressures have  been in the 150s to 160s systolically at various doctors appointments.  He checks his pulse from time to time and it has been regular and  maintaining in the 70s.  He had no palpitations when he had atrial  fibrillation or atrial flutter.  He only felt dizziness.  His wife was  able to tell that his heart rate was rapid and irregular when he  presented back with recurrent atrial flutter.  That has not occurred  recently.  He has had occasional headaches recently.  He also has some  visual disturbance that he has associated with ocular migraines in the  past.  He has had a couple of these episodes recently.  He denies any  symptoms consistent with amaurosis fugax.  He denies any chest pain.  Denies any shortness of breath.  Denies any syncope or near syncope,  however he has had a couple of episodes of dizziness.  He denies any  fatigue.  He is remaining quite active and is  able to do most activities  that he wants to do without difficulty.  He does admit to some salt  indiscretion recently.   CURRENT MEDICATIONS:  1. Lipitor 10 mg nightly.  2. Coumadin as directed.  3. Lisinopril 5 mg daily.  4. Metoprolol 50 mg twice daily.   ALLERGIES:  NO KNOWN DRUG ALLERGIES.   PHYSICAL EXAMINATION:  He is a well-nourished, well-developed man who  shows blood pressure 146/90, pulse 74, weight 170 pounds.  Repeat blood  pressure by me is 152/90 on the left, 154/94 on the right.  HEENT:  Unremarkable.  NECK:  Without JVD.  CARDIO:  Normal S1, S2, regular rate and rhythm.  LUNGS:  Clear to auscultation bilaterally without wheezes, rhonchi or  rales  ABDOMEN:  Soft, nontender.  EXTREMITIES:  Without edema, calves are soft, nontender.  ELECTROCARDIOGRAM:  Reveals sinus rhythm with a heart rate of 74, normal  axis, nonspecific ST-T wave changes.   IMPRESSION:  1. Hypertension.  2. History of atrial fibrillation/flutter.      a.     Status post cardioversion December 2007 for flutter       recurring postoperatively.      b.     Status post resection of left atrial myxoma with pericardial       patch October 2007  3. Status post modified Cox-Maze procedure at time of atrial myxoma      resection October 2007.  4. Coronary artery disease.      a.     Status post coronary artery bypass grafting times two with a       vein graft to the ramus intermedius and a vein graft to the       circumflex marginal branch at the time of his atrial myxoma       resection October 2007.  5. Tachycardia induced cardiomyopathy with an ejection fraction of 30%      - 35% by echocardiogram prior to his atrial myxoma resection and      bypass surgery.  6. Hyperlipidemia, treated.  7. Rash.   PLAN:  The patient presents to the office today with complaints of  elevated blood pressures recently.  Prior to his admission to the  hospital with new onset atrial fibrillation in October, he  had had  fairly well controlled blood pressures with Cartia.  At some point prior  to his admission to the hospital in October this medication was stopped.  His pressures have been fairly well controlled with metoprolol and  lisinopril 5 mg a day.  However, recently his pressures have started to  go up again.  I think some of his dizziness that he has been  experiencing is probably related to his blood pressures.  He has also  had a couple of ocular migraines as well.  This is most likely related.  Although he has not had palpitations in the past with his atrial  fibrillation, his wife has been able to feel his pulse and note  tachyarrhythmia.  She has not noted this recently and he is in sinus  rhythm today.  I think at this point in time we will adjust his  lisinopril from 5 mg to 10 mg a day.  He will come back in 1 week for a  blood pressure check and a BMET.  He sees Dr. Ladona Ridgel later this month.  He can keep that appointment.  Should his blood pressure remain elevated  we could certainly increase his lisinopril to 20 mg or change him to  lisinopril/HCT.   The patient also notes a rash about his bilateral arms.  This is a  maculopapular rash that it is pruritic.  He has some excoriation.  He  denies any new medications.  Denies any other typical  allergens.  I have recommended over the counter Benadryl and over the  counter hydrocortisone cream b.i.d.  If this is not improving or  worsening he is to see his primary care physician.      Tereso Newcomer, PA-C  Electronically Signed      Duke Salvia, MD, [facility]  Electronically Signed   SW/MedQ  DD: 01/02/2007  DT: 01/02/2007  Job #: [id number]   cc:   Stacie Glaze, MD

## 2011-04-19 NOTE — Assessment & Plan Note (Signed)
[facility]                         ELECTROPHYSIOLOGY OFFICE NOTE   Ethan Payne, Ethan Payne                      MRN:          [id number]  DATE:01/26/2007                            DOB:          1946/05/25    Ethan Payne returns today for followup.  He is a very pleasant 65-year-  old man with a history of atrial fibrillation and left atrium myxoma  status post resection, status post CABG, status post Cox maze who  initially had atrial flutter postoperatively (atypical) but is now  maintaining sinus rhythm very nicely after DC cardioversion.  He is  doing quite well.  He denies chest pain or shortness of breath.  He has  had no palpitations.  His only complaint today is that of very mild  elevation in his blood pressure.  He states that at home when he  exercises his blood pressure is stable but at other times when he checks  it, it may be elevated up to 130-140-150 range.  When he sees the doctor  in the office, it is even higher.  On exam today, he is a pleasant, well-  appearing, middle-aged man in no distress.  The blood pressure on my  exam was 135/95, the pulse was 76 and regular, respirations were 18, the  weight was 169.  The neck reviewed no jugular venous distention.  Lungs  were clear bilaterally to auscultation.  Cardiovascular exam revealed a  regular rate and rhythm with normal S1 and S2.  Extremities demonstrated  no edema.  Medications include Lipitor, Coumadin, metoprolol 50 twice a  day, and lisinopril 10 daily.   IMPRESSION:  1. Coronary artery disease status post bypass surgery.  2. Left atrial myxoma status post resection.  3. Atrial fibrillation status post Cox maze.  4. Postoperative atrial flutter, now resolved.   DISCUSSION:  Overall, Ethan Payne was stable.  His blood pressure is  elevated slightly.  I have recommended that he check his blood pressure  and record it every day and we will go through a period of watchful  waiting to determine whether or not we should additionally treat his  hypertension.  He will continue his medicines otherwise.     Doylene Canning. Ladona Ridgel, MD  Electronically Signed    GWT/MedQ  DD: 01/26/2007  DT: 01/26/2007  Job #: [id number]

## 2011-04-19 NOTE — Op Note (Signed)
[name], [name] NO.:  [id number]   MEDICAL RECORD NO.:  [id number]          PATIENT TYPE:  OIB   LOCATION:  2852                         FACILITY:  [facility]   PHYSICIAN:  Doylene Canning. Ladona Ridgel, MD    DATE OF BIRTH:  [birth year]   DATE OF PROCEDURE:  11/13/2006  DATE OF DISCHARGE:                               OPERATIVE REPORT   PROCEDURE PERFORMED:  Direct current cardioversion.   INDICATIONS:  Symptomatic atrial flutter post MAZE procedure.   INTRODUCTION:  The patient is a very pleasant 65 year old male with a  history of left atrial myxoma A-fib who underwent myxoma resection and  MAZE procedure.  He did well but developed atrial flutter and is now  referred for DC cardioversion secondary to symptomatic persistent atrial  flutter.   PROCEDURE:  After informed consent was obtained, the patient was prepped  in the usual manner.  The electrode dispersive pads were placed in the  anterior posterior position.  The patient was sedated with 75 mg of  sodium Pentothal under the direction of Dr. Katrinka Blazing with anesthesia.  He  was DC cardioverted with 150 joules synchronized biphasic energy  restoring sinus rhythm.  The patient tolerated the procedure well.      Doylene Canning. Ladona Ridgel, MD  Electronically Signed     GWT/MEDQ  D:  11/13/2006  T:  11/13/2006  Job:  [id number]   cc:   Stacie Glaze, MD  Salvatore Decent. Cornelius Moras, M.D.

## 2011-04-19 NOTE — Assessment & Plan Note (Signed)
[facility]                              CARDIOLOGY OFFICE NOTE   DERRAL, COLUCCI                      MRN:          [id number]  DATE:10/14/2006                            DOB:          [birth year]    REFERRING PHYSICIAN:  Cecil Cranker, MD, [facility]   This is a patient of Dr. Ladona Ridgel. Actually this was discussed extensively  with Dr. Graciela Husbands. This is a post hospital visit.   This is a 65 year old white male patient who recently underwent resection of  a left atrial myxoma, pericardial patch reconstruction of the intraatrial  septum with a modified Cox-Maze fluoroscopy procedure. He also had CABG x2  with an SVG to the ramus intermediate branch and SVG to the circumflex  branch. He initially had presented with rapid atrial flutter when this all  started. He was started on Coumadin but his INR has not been therapeutic.  Today his INR is 1.9 but that is the first it has been up. EKG in the  hospital all show probable atrial flutter but October 31 he was actually in  a sinus tachycardia at 102 beats per minute. Since he has been home, he is  actually feeling quite well. He denies any increased shortness of breath,  palpitations, dizziness or presyncope. He denies any chest pain.   CURRENT MEDICATIONS:  1. Aspirin 81 mg daily.  2. Metoprolol 25 mg b.i.d.  3. Lipitor 10 mg q.h.s.  4. Coumadin.  5. Lisinopril 5 mg daily.   PHYSICAL EXAMINATION:  GENERAL:  This is a 65 year old white male that looks  a bit pale.  VITAL SIGNS:  Blood pressure 150/90, pulse 116, weight 162.  NECK:  Without JVD, HJR, bruit or thyroid enlargement.  LUNGS:  Decreased breath sounds at the right base but clear elsewhere.  HEART:  Regular rate and rhythm at 116 beats per minute, normal S2 and S2.  No significant murmur, rub, bruit, thrill or heave noted.  ABDOMEN:  Soft without organomegaly, masses, lesions or abnormal tenderness.  EXTREMITIES:  Right groin is stable  without hematoma or hemorrhage. Lower  extremities without cyanosis, clubbing or edema. He has good distal pulses.  All incisions are healing well.   EKG is tachycardia at 117 beats per minute. Dr. Graciela Husbands felt this was probably  a flutter but recommended carotid massage followed by adenosine to see what  this rhythm was. The patient did not slow down with carotid massage. We then  gave him 6 mg of adenosine and he slowed down enough for Dr. Graciela Husbands to think  that this was a flutter.   IMPRESSION:  1. Recurrent atrial flutter with rapid ventricular rate.  2. Status post resection of left atrial myxoma with pericardial patch      reconstruction of the intraatrial septum and modified Cox-Maze      fluoroscopic procedure.  3. Status post coronary artery bypass graft x2 with saphenous vein graft      to the ramus intermediate and saphenous vein graft to the circumflex.  4. Hypertension.  5. Hyperlipidemia.   PLAN:  Because the patient's  INRs have not been therapeutic, Dr. Graciela Husbands  recommends increasing his Lopressor to 50 mg b.i.d. and having him return to  see Dr. Ladona Ridgel in 2 weeks to be set up for outpatient cardioversion. In the  interim if he becomes symptomatic with this tachycardia and begins to feel  worse, he should call and we can admit him for a TEE guided cardioversion. I  have discussed this in detail with he and his wife who are agreeable. They  are scheduled to get a chest x-ray today as well as go to the Coumadin  clinic for further instructions and he will see Dr. Ladona Ridgel back 2 weeks.      Jacolyn Reedy, PA-C  Electronically Signed      Duke Salvia, MD, [facility]  Electronically Signed   ML/MedQ  DD: 10/14/2006  DT: 10/14/2006  Job #: [id number]

## 2011-04-22 ENCOUNTER — Ambulatory Visit (INDEPENDENT_AMBULATORY_CARE_PROVIDER_SITE_OTHER): Payer: BC Managed Care – PPO | Admitting: Internal Medicine

## 2011-04-22 ENCOUNTER — Encounter: Payer: Self-pay | Admitting: Internal Medicine

## 2011-04-22 VITALS — BP 154/80 | HR 76 | Temp 98.2°F | Resp 16 | Wt 184.0 lb

## 2011-04-22 DIAGNOSIS — M1710 Unilateral primary osteoarthritis, unspecified knee: Secondary | ICD-10-CM

## 2011-04-22 MED ORDER — METHYLPREDNISOLONE ACETATE 40 MG/ML IJ SUSP: 40.0000 mg | Freq: Once | INTRAMUSCULAR | Status: DC

## 2011-04-22 NOTE — Progress Notes (Signed)
  Subjective:    Patient ID: Ethan Payne, male    DOB: 1946-03-06, 65 y.o.   MRN: [id number]  HPI Knee injection was 4-5 months ago and there was good pain relief for up to 3 months.  He is here for reinjection the Celebrex that we gave for maintenance anti-inflammatory did not help.    Review of Systems  Constitutional: Negative for fever and fatigue.  HENT: Negative for hearing loss, congestion, neck pain and postnasal drip.   Eyes: Negative for discharge, redness and visual disturbance.  Respiratory: Negative for cough, shortness of breath and wheezing.   Cardiovascular: Negative for leg swelling.  Gastrointestinal: Negative for abdominal pain, constipation and abdominal distention.  Genitourinary: Negative for urgency and frequency.  Musculoskeletal: Positive for joint swelling and gait problem. Negative for arthralgias.  Skin: Negative for color change and rash.  Neurological: Negative for weakness and light-headedness.  Hematological: Negative for adenopathy.  Psychiatric/Behavioral: Negative for behavioral problems.       Past Medical History  Diagnosis Date  . Acute prostatitis 02/04/2008  . ATRIAL MYXOMA 02/04/2008  . Closed fracture of four ribs 11/01/2009  . HYPERLIPIDEMIA 02/04/2008  . HYPERTENSION 02/04/2008  . LOC OSTEOARTHROS NOT SPEC PRIM/SEC LOWER LEG 04/18/2010  . UNS ADVRS EFF UNS RX MEDICINAL&BIOLOGICAL SBSTNC 02/04/2008   Past Surgical History  Procedure Date  . Removal of atrial myxoma   . Cardio version for a fibi     reports that he has never smoked. He does not have any smokeless tobacco history on file. He reports that he does not drink alcohol or use illicit drugs. family history includes Heart disease in his father; Hypertension in his father; and Stroke in his mother. No Known Allergies  Objective:   Physical Exam  Constitutional: He appears well-developed and well-nourished.  HENT:  Head: Normocephalic and atraumatic.  Eyes: Conjunctivae are  normal. Pupils are equal, round, and reactive to light.  Neck: Normal range of motion. Neck supple.  Cardiovascular: Normal rate and regular rhythm.   Pulmonary/Chest: Effort normal and breath sounds normal.  Abdominal: Soft. Bowel sounds are normal.  Musculoskeletal: He exhibits edema and tenderness.       Right knee   The long discussion about osteoarthritis of the knee and the fact that continued the injections are not the correct path but appears pain persists we could consider Synvisc injections to the light inevitable which will be total knee replacement.  Does not wish to be referred to orthopedist at this time but will keep close contact about the degree of his knee pain.  He states that the last knee injection lasted for over 4 months.        Assessment & Plan:   Informed consent obtained and the patient's knee was prepped Betadine local anesthesia obtained with topical spray 40 mg of Depo-Medrol and 1/2 cc of lidocaine was injected into the joint space the patient tolerated the injection well post injection care discussed with patient ice placed

## 2011-04-23 ENCOUNTER — Other Ambulatory Visit: Payer: Self-pay | Admitting: Internal Medicine

## 2011-05-25 ENCOUNTER — Other Ambulatory Visit: Payer: Self-pay | Admitting: Internal Medicine

## 2011-06-25 ENCOUNTER — Other Ambulatory Visit: Payer: Self-pay | Admitting: Internal Medicine

## 2011-07-16 ENCOUNTER — Other Ambulatory Visit: Payer: BC Managed Care – PPO

## 2011-07-21 ENCOUNTER — Other Ambulatory Visit: Payer: Self-pay | Admitting: Internal Medicine

## 2011-07-23 ENCOUNTER — Ambulatory Visit: Payer: BC Managed Care – PPO | Admitting: Internal Medicine

## 2011-07-27 ENCOUNTER — Other Ambulatory Visit: Payer: Self-pay | Admitting: Internal Medicine

## 2011-09-11 ENCOUNTER — Other Ambulatory Visit: Payer: Self-pay | Admitting: Internal Medicine

## 2011-10-07 ENCOUNTER — Telehealth: Payer: Self-pay | Admitting: *Deleted

## 2011-10-07 ENCOUNTER — Other Ambulatory Visit: Payer: BC Managed Care – PPO

## 2011-10-07 ENCOUNTER — Other Ambulatory Visit (INDEPENDENT_AMBULATORY_CARE_PROVIDER_SITE_OTHER): Payer: BC Managed Care – PPO

## 2011-10-07 DIAGNOSIS — E785 Hyperlipidemia, unspecified: Secondary | ICD-10-CM

## 2011-10-07 DIAGNOSIS — N410 Acute prostatitis: Secondary | ICD-10-CM

## 2011-10-07 LAB — LDL CHOLESTEROL, DIRECT: Direct LDL: 203.9 mg/dL

## 2011-10-07 LAB — PSA, TOTAL AND FREE: PSA, Free: <0.01 ng/mL

## 2011-10-07 LAB — LIPID PANEL
Cholesterol: 271 mg/dL — ABNORMAL HIGH (ref 0–200)
HDL: 44.7 mg/dL (ref >39.00)
Triglycerides: 163 mg/dL — ABNORMAL HIGH (ref 0.0–149.0)

## 2011-10-07 NOTE — Telephone Encounter (Signed)
Pt here for lab work and c/o elevated bp -- bp was 160/98--per dr Lovell Sheehan- increase lisinopril to bid and monitor bp and bring reading to appointment next monday

## 2011-10-10 ENCOUNTER — Other Ambulatory Visit: Payer: Self-pay | Admitting: Internal Medicine

## 2011-10-14 ENCOUNTER — Encounter: Payer: Self-pay | Admitting: *Deleted

## 2011-10-14 ENCOUNTER — Encounter: Payer: Self-pay | Admitting: Internal Medicine

## 2011-10-14 ENCOUNTER — Ambulatory Visit (INDEPENDENT_AMBULATORY_CARE_PROVIDER_SITE_OTHER): Payer: BC Managed Care – PPO | Admitting: Internal Medicine

## 2011-10-14 DIAGNOSIS — E785 Hyperlipidemia, unspecified: Secondary | ICD-10-CM

## 2011-10-14 DIAGNOSIS — Z1211 Encounter for screening for malignant neoplasm of colon: Secondary | ICD-10-CM

## 2011-10-14 DIAGNOSIS — I10 Essential (primary) hypertension: Secondary | ICD-10-CM

## 2011-10-14 DIAGNOSIS — M1990 Unspecified osteoarthritis, unspecified site: Secondary | ICD-10-CM

## 2011-10-14 DIAGNOSIS — T887XXA Unspecified adverse effect of drug or medicament, initial encounter: Secondary | ICD-10-CM

## 2011-10-14 DIAGNOSIS — J329 Chronic sinusitis, unspecified: Secondary | ICD-10-CM

## 2011-10-14 MED ORDER — LEVOFLOXACIN 500 MG PO TABS: 500.0000 mg | ORAL_TABLET | Freq: Every day | ORAL | Status: AC

## 2011-10-14 MED ORDER — LEVOFLOXACIN 500 MG PO TABS: 500.0000 mg | ORAL_TABLET | Freq: Every day | ORAL | Status: DC

## 2011-10-14 MED ORDER — TRAMADOL HCL 50 MG PO TABS: 50.0000 mg | ORAL_TABLET | Freq: Three times a day (TID) | ORAL | Status: AC | PRN

## 2011-10-14 MED ORDER — TRAMADOL HCL 50 MG PO TABS: 50.0000 mg | ORAL_TABLET | Freq: Three times a day (TID) | ORAL | Status: DC | PRN

## 2011-10-14 NOTE — Progress Notes (Signed)
  Subjective:    Patient ID: Ethan Payne, male    DOB: June 22, 1946, 65 y.o.   MRN: [id number]  HPI Elevated blood pressure for two months Checking every other day... Wife checks every other day. Has been taking OTC decongestants Increased the lisinopril last week to 20mg  with little effect Need stool screening deferred colon exam due to caring for pts wife  Acute sinus symptoms has been taking advil and cough meds The advil may be interfering with the lisinopril?   Review of Systems  Constitutional: Negative for fever and fatigue.  HENT: Positive for rhinorrhea, sneezing, postnasal drip and sinus pressure. Negative for hearing loss, congestion and neck pain.   Eyes: Negative for discharge, redness and visual disturbance.  Respiratory: Negative for cough, shortness of breath and wheezing.   Cardiovascular: Negative for leg swelling.  Gastrointestinal: Negative for abdominal pain, constipation and abdominal distention.  Genitourinary: Negative for urgency and frequency.  Musculoskeletal: Negative for joint swelling and arthralgias.  Skin: Negative for color change and rash.  Neurological: Negative for weakness and light-headedness.  Hematological: Negative for adenopathy.  Psychiatric/Behavioral: Negative for behavioral problems.   Past Medical History  Diagnosis Date  . Acute prostatitis 02/04/2008  . ATRIAL MYXOMA 02/04/2008  . Closed fracture of four ribs 11/01/2009  . HYPERLIPIDEMIA 02/04/2008  . HYPERTENSION 02/04/2008  . LOC OSTEOARTHROS NOT SPEC PRIM/SEC LOWER LEG 04/18/2010  . UNS ADVRS EFF UNS RX MEDICINAL&BIOLOGICAL SBSTNC 02/04/2008   Past Surgical History  Procedure Date  . Removal of atrial myxoma   . Cardio version for a fibi     reports that he has never smoked. He does not have any smokeless tobacco history on file. He reports that he does not drink alcohol or use illicit drugs. family history includes Heart disease in his father; Hypertension in his father; and  Stroke in his mother. No Known Allergies     Objective:   Physical Exam  Nursing note and vitals reviewed. Constitutional: He is oriented to person, place, and time. He appears well-developed and well-nourished.  HENT:  Head: Normocephalic and atraumatic.  Eyes: Conjunctivae are normal. Pupils are equal, round, and reactive to light.       Inflamed turbinates and posterior cobblestoning  Neck: Normal range of motion. Neck supple.  Cardiovascular: Normal rate and regular rhythm.   Pulmonary/Chest: Effort normal and breath sounds normal.  Abdominal: Soft. Bowel sounds are normal.  Neurological: He is alert and oriented to person, place, and time.  Skin: Skin is warm.          Assessment & Plan:  Acute sinusitis we'll treat with level Floxin 50 mg daily for 10 days.  We recommend a nasal corticosteroid for his allergic rhinitis.  Blood pressure has been destabilized by the excessive use of nonsteroidals we will DC all nonsteroidals check a renal function panel an urgent use Ultram as needed for arthritis pain.  He'll follow up in 2 months time

## 2011-10-14 NOTE — Patient Instructions (Signed)
The patient is instructed to continue all medications as prescribed. Schedule followup with check out clerk upon leaving the clinic  

## 2011-10-15 LAB — BASIC METABOLIC PANEL
CO2: 30 mEq/L (ref 19–32)
Calcium: 9.5 mg/dL (ref 8.4–10.5)
GFR: 77.53 mL/min (ref >60.00)
Glucose, Bld: 84 mg/dL (ref 70–99)
Potassium: 4.3 mEq/L (ref 3.5–5.1)
Sodium: 140 mEq/L (ref 135–145)

## 2011-11-08 ENCOUNTER — Other Ambulatory Visit: Payer: Self-pay | Admitting: Internal Medicine

## 2011-12-05 ENCOUNTER — Other Ambulatory Visit: Payer: Self-pay | Admitting: Internal Medicine

## 2011-12-19 ENCOUNTER — Ambulatory Visit: Payer: BC Managed Care – PPO | Admitting: Internal Medicine

## 2012-03-17 ENCOUNTER — Ambulatory Visit (INDEPENDENT_AMBULATORY_CARE_PROVIDER_SITE_OTHER): Payer: BC Managed Care – PPO | Admitting: Family

## 2012-03-17 ENCOUNTER — Encounter: Payer: Self-pay | Admitting: Family

## 2012-03-17 VITALS — BP 140/88 | Temp 100.9°F | Wt 174.0 lb

## 2012-03-17 DIAGNOSIS — R059 Cough, unspecified: Secondary | ICD-10-CM

## 2012-03-17 DIAGNOSIS — J40 Bronchitis, not specified as acute or chronic: Secondary | ICD-10-CM

## 2012-03-17 DIAGNOSIS — R05 Cough: Secondary | ICD-10-CM

## 2012-03-17 MED ORDER — AMOXICILLIN 500 MG PO TABS: 1000.0000 mg | ORAL_TABLET | Freq: Two times a day (BID) | ORAL | Status: AC

## 2012-03-17 MED ORDER — HYDROCOD POLST-CHLORPHEN POLST 10-8 MG/5ML PO LQCR: 5.0000 mL | Freq: Two times a day (BID) | ORAL | Status: DC | PRN

## 2012-03-17 NOTE — Patient Instructions (Addendum)
1. Coricidin HBP  Bronchitis Bronchitis is the body's way of reacting to injury and/or infection (inflammation) of the bronchi. Bronchi are the air tubes that extend from the windpipe into the lungs. If the inflammation becomes severe, it may cause shortness of breath. CAUSES  Inflammation may be caused by:  A virus.   Germs (bacteria).   Dust.   Allergens.   Pollutants and many other irritants.  The cells lining the bronchial tree are covered with tiny hairs (cilia). These constantly beat upward, away from the lungs, toward the mouth. This keeps the lungs free of pollutants. When these cells become too irritated and are unable to do their job, mucus begins to develop. This causes the characteristic cough of bronchitis. The cough clears the lungs when the cilia are unable to do their job. Without either of these protective mechanisms, the mucus would settle in the lungs. Then you would develop pneumonia. Smoking is a common cause of bronchitis and can contribute to pneumonia. Stopping this habit is the single most important thing you can do to help yourself. TREATMENT   Your caregiver may prescribe an antibiotic if the cough is caused by bacteria. Also, medicines that open up your airways make it easier to breathe. Your caregiver may also recommend or prescribe an expectorant. It will loosen the mucus to be coughed up. Only take over-the-counter or prescription medicines for pain, discomfort, or fever as directed by your caregiver.   Removing whatever causes the problem (smoking, for example) is critical to preventing the problem from getting worse.   Cough suppressants may be prescribed for relief of cough symptoms.   Inhaled medicines may be prescribed to help with symptoms now and to help prevent problems from returning.   For those with recurrent (chronic) bronchitis, there may be a need for steroid medicines.  SEEK IMMEDIATE MEDICAL CARE IF:   During treatment, you develop more  pus-like mucus (purulent sputum).   You have a fever.   Your baby is older than 3 months with a rectal temperature of 102 F (38.9 C) or higher.   Your baby is 89 months old or younger with a rectal temperature of 100.4 F (38 C) or higher.   You become progressively more ill.   You have increased difficulty breathing, wheezing, or shortness of breath.  It is necessary to seek immediate medical care if you are elderly or sick from any other disease. MAKE SURE YOU:   Understand these instructions.   Will watch your condition.   Will get help right away if you are not doing well or get worse.  Document Released: 11/18/2005 Document Revised: 11/07/2011 Document Reviewed: 09/27/2008 [facility] Patient Information 2012 [place], [place].

## 2012-03-17 NOTE — Progress Notes (Signed)
Subjective:    Patient ID: Ethan Payne, male    DOB: [birth year], 66 y.o.   MRN: [id number]  Sinusitis This is a new problem. The current episode started in the past 7 days. The problem has been rapidly worsening since onset. The maximum temperature recorded prior to his arrival was 101 - 101.9 F. The fever has been present for 1 to 2 days. The pain is moderate. Associated symptoms include congestion, coughing, headaches and swollen glands. Past treatments include acetaminophen. The treatment provided no relief.  Headache  Associated symptoms include coughing and swollen glands.      Review of Systems  Constitutional: Negative.   HENT: Positive for congestion.   Respiratory: Positive for cough.   Cardiovascular: Negative.   Gastrointestinal: Negative.   Musculoskeletal: Negative.   Neurological: Positive for headaches.  Hematological: Negative.   Psychiatric/Behavioral: Negative.    Past Medical History  Diagnosis Date  . Acute prostatitis 02/04/2008  . ATRIAL MYXOMA 02/04/2008  . Closed fracture of four ribs 11/01/2009  . HYPERLIPIDEMIA 02/04/2008  . HYPERTENSION 02/04/2008  . LOC OSTEOARTHROS NOT SPEC PRIM/SEC LOWER LEG 04/18/2010  . UNS ADVRS EFF UNS RX MEDICINAL&BIOLOGICAL SBSTNC 02/04/2008    History   Social History  . Marital Status: Married    Spouse Name: N/A    Number of Children: N/A  . Years of Education: N/A   Occupational History  . married    Social History Main Topics  . Smoking status: Never Smoker   . Smokeless tobacco: Not on file  . Alcohol Use: No  . Drug Use: No  . Sexually Active: Yes   Other Topics Concern  . Not on file   Social History Narrative  . No narrative on file    Past Surgical History  Procedure Date  . Removal of atrial myxoma   . Cardio version for a fibi     Family History  Problem Relation Age of Onset  . Stroke Mother   . Heart disease Father   . Hypertension Father     No Known Allergies  Current Outpatient  Prescriptions on File Prior to Visit  Medication Sig Dispense Refill  . aspirin 325 MG tablet Take 325 mg by mouth daily.        Marland Kitchen lisinopril (PRINIVIL,ZESTRIL) 20 MG tablet TAKE 1 TABLET EVERY DAY  30 tablet  0  . metoprolol (LOPRESSOR) 50 MG tablet TAKE 1 TABLET BY MOUTH TWICE A DAY  60 tablet  7  . rosuvastatin (CRESTOR) 20 MG tablet Take 20 mg by mouth daily.        . traMADol (ULTRAM) 50 MG tablet Take 1 tablet (50 mg total) by mouth every 8 (eight) hours as needed for pain. Maximum dose= 8 tablets per day  90 tablet  6    BP 140/88  Temp(Src) 100.9 F (38.3 C) (Oral)  Wt 174 lb (78.926 kg)chart    Objective:   Physical Exam  Constitutional: He is oriented to person, place, and time. He appears well-developed and well-nourished.  HENT:  Right Ear: External ear normal.  Left Ear: External ear normal.  Nose: Nose normal.  Mouth/Throat: Oropharynx is clear and moist.  Neck: Normal range of motion. Neck supple.  Cardiovascular: Normal rate, regular rhythm and normal heart sounds.   Pulmonary/Chest: Effort normal and breath sounds normal.  Abdominal: Soft. Bowel sounds are normal.  Musculoskeletal: Normal range of motion.  Neurological: He is alert and oriented to person, place, and time.  Skin: Skin  is warm and dry.  Psychiatric: He has a normal mood and affect.          Assessment & Plan:  Assessment:Acute Bronchitis, Cough  Plan: Amoxil 500mg  BID x 10 days, OTC Coricidan HBP as directed. Tussionex prn. Call the office if symptoms worsen or persist. Recheck as scheduled and prn.

## 2012-03-31 ENCOUNTER — Other Ambulatory Visit: Payer: Self-pay | Admitting: Internal Medicine

## 2012-12-10 ENCOUNTER — Ambulatory Visit (INDEPENDENT_AMBULATORY_CARE_PROVIDER_SITE_OTHER): Payer: BC Managed Care – PPO | Admitting: Internal Medicine

## 2012-12-10 ENCOUNTER — Encounter: Payer: Self-pay | Admitting: Internal Medicine

## 2012-12-10 VITALS — BP 160/100 | Temp 98.5°F | Wt 181.0 lb

## 2012-12-10 DIAGNOSIS — I10 Essential (primary) hypertension: Secondary | ICD-10-CM

## 2012-12-10 DIAGNOSIS — H6120 Impacted cerumen, unspecified ear: Secondary | ICD-10-CM

## 2012-12-10 MED ORDER — ROSUVASTATIN CALCIUM 20 MG PO TABS: 20.0000 mg | ORAL_TABLET | Freq: Every day | ORAL | Status: DC

## 2012-12-10 MED ORDER — AMLODIPINE BESYLATE 2.5 MG PO TABS: 2.5000 mg | ORAL_TABLET | Freq: Every day | ORAL | Status: DC

## 2012-12-10 MED ORDER — METOPROLOL TARTRATE 50 MG PO TABS: 50.0000 mg | ORAL_TABLET | Freq: Two times a day (BID) | ORAL | Status: DC

## 2012-12-10 MED ORDER — LISINOPRIL 20 MG PO TABS: 20.0000 mg | ORAL_TABLET | Freq: Every day | ORAL | Status: DC

## 2012-12-10 NOTE — Progress Notes (Signed)
  Subjective:    Patient ID: Ethan Payne, male    DOB: 09/09/1946, 67 y.o.   MRN: [id number]  HPI  67 year old white male with history of hypertension and hyperlipidemia complains of right ear discomfort. He has history of cerumen impaction in the past. He feels like his right ear is full. He has decreased hearing in right ear. He wears hearing aid on left side.  Hypertension-blood pressure not well-controlled. He monitors at home. Systolic blood pressure readings range between 140s and 150s. He is taking lisinopril 20 mg once daily and metoprolol 50 mg twice daily.  Review of Systems Negative for chest pain or shortness of breath  Past Medical History  Diagnosis Date  . Acute prostatitis 02/04/2008  . ATRIAL MYXOMA 02/04/2008  . Closed fracture of four ribs 11/01/2009  . HYPERLIPIDEMIA 02/04/2008  . HYPERTENSION 02/04/2008  . LOC OSTEOARTHROS NOT SPEC PRIM/SEC LOWER LEG 04/18/2010  . UNS ADVRS EFF UNS RX MEDICINAL&BIOLOGICAL SBSTNC 02/04/2008    History   Social History  . Marital Status: Married    Spouse Name: N/A    Number of Children: N/A  . Years of Education: N/A   Occupational History  . married    Social History Main Topics  . Smoking status: Never Smoker   . Smokeless tobacco: Not on file  . Alcohol Use: No  . Drug Use: No  . Sexually Active: Yes   Other Topics Concern  . Not on file   Social History Narrative  . No narrative on file    Past Surgical History  Procedure Date  . Removal of atrial myxoma   . Cardio version for a fibi     Family History  Problem Relation Age of Onset  . Stroke Mother   . Heart disease Father   . Hypertension Father     No Known Allergies  Current Outpatient Prescriptions on File Prior to Visit  Medication Sig Dispense Refill  . aspirin 325 MG tablet Take 325 mg by mouth daily.        Marland Kitchen lisinopril (PRINIVIL,ZESTRIL) 20 MG tablet Take 1 tablet (20 mg total) by mouth daily.  30 tablet  0  . metoprolol (LOPRESSOR) 50 MG  tablet Take 1 tablet (50 mg total) by mouth 2 (two) times daily.  60 tablet  0  . rosuvastatin (CRESTOR) 20 MG tablet Take 1 tablet (20 mg total) by mouth daily.  30 tablet  0  . amLODipine (NORVASC) 2.5 MG tablet Take 1 tablet (2.5 mg total) by mouth daily.  30 tablet  0    BP 160/100  Temp 98.5 F (36.9 C) (Oral)  Wt 181 lb (82.101 kg)       Objective:   Physical Exam  Constitutional: He is oriented to person, place, and time. He appears well-developed and well-nourished.  HENT:  Head: Normocephalic and atraumatic.       Right cerumen impaction, wears left hearing aid  Cardiovascular: Normal rate, regular rhythm and normal heart sounds.   Pulmonary/Chest: Effort normal and breath sounds normal. He has no wheezes.  Neurological: He is alert and oriented to person, place, and time.  Skin: Skin is warm and dry.  Psychiatric: He has a normal mood and affect. His behavior is normal.          Assessment & Plan:

## 2012-12-10 NOTE — Assessment & Plan Note (Addendum)
Blood pressure is suboptimally controlled despite taking 20 mg of lisinopril and metoprolol 50 mg twice daily. Add amlodipine 2.5 mg once daily. Followup with primary care physician within 4 weeks. Obtain electrolytes and kidney function before office visit.  BP: 160/100 mmHg  Lab Results  Component Value Date   CREATININE 1.0 10/14/2011   Lab Results  Component Value Date   NA 140 10/14/2011   K 4.3 10/14/2011   CL 105 10/14/2011   CO2 30 10/14/2011

## 2012-12-10 NOTE — Assessment & Plan Note (Signed)
Irrigated right ear canal and utilized throughout to remove cerumen plug. No complications.

## 2012-12-10 NOTE — Patient Instructions (Addendum)
Please complete the following lab tests before your next follow up appointment: BMET - 401.9 LFTs, FLP - 272.4 

## 2013-01-01 ENCOUNTER — Other Ambulatory Visit (INDEPENDENT_AMBULATORY_CARE_PROVIDER_SITE_OTHER): Payer: BC Managed Care – PPO

## 2013-01-01 DIAGNOSIS — E785 Hyperlipidemia, unspecified: Secondary | ICD-10-CM

## 2013-01-01 DIAGNOSIS — I10 Essential (primary) hypertension: Secondary | ICD-10-CM

## 2013-01-01 LAB — HEPATIC FUNCTION PANEL
AST: 26 U/L (ref 0–37)
Albumin: 4.1 g/dL (ref 3.5–5.2)
Alkaline Phosphatase: 44 U/L (ref 39–117)
Bilirubin, Direct: 0 mg/dL (ref 0.0–0.3)
Total Protein: 7.7 g/dL (ref 6.0–8.3)

## 2013-01-01 LAB — BASIC METABOLIC PANEL
BUN: 13 mg/dL (ref 6–23)
Calcium: 9.7 mg/dL (ref 8.4–10.5)
Creatinine, Ser: 1.2 mg/dL (ref 0.4–1.5)
GFR: 64.75 mL/min (ref >60.00)

## 2013-01-01 LAB — LIPID PANEL
Cholesterol: 170 mg/dL (ref 0–200)
Triglycerides: 107 mg/dL (ref 0.0–149.0)

## 2013-01-06 ENCOUNTER — Other Ambulatory Visit: Payer: Self-pay | Admitting: Internal Medicine

## 2013-01-08 ENCOUNTER — Encounter: Payer: Self-pay | Admitting: Internal Medicine

## 2013-01-08 ENCOUNTER — Ambulatory Visit (INDEPENDENT_AMBULATORY_CARE_PROVIDER_SITE_OTHER): Payer: BC Managed Care – PPO | Admitting: Internal Medicine

## 2013-01-08 VITALS — BP 146/80 | Temp 97.7°F | Wt 182.0 lb

## 2013-01-08 DIAGNOSIS — I10 Essential (primary) hypertension: Secondary | ICD-10-CM

## 2013-01-08 MED ORDER — ROSUVASTATIN CALCIUM 20 MG PO TABS: 20.0000 mg | ORAL_TABLET | Freq: Every day | ORAL | Status: DC

## 2013-01-08 MED ORDER — METOPROLOL TARTRATE 50 MG PO TABS: 50.0000 mg | ORAL_TABLET | Freq: Two times a day (BID) | ORAL | Status: DC

## 2013-01-08 MED ORDER — AMLODIPINE BESYLATE 5 MG PO TABS: 5.0000 mg | ORAL_TABLET | Freq: Every day | ORAL | Status: DC

## 2013-01-08 MED ORDER — LISINOPRIL 20 MG PO TABS: 20.0000 mg | ORAL_TABLET | Freq: Every day | ORAL | Status: DC

## 2013-01-08 NOTE — Progress Notes (Signed)
  Subjective:    Patient ID: Ethan Payne, male    DOB: 09-05-1946, 67 y.o.   MRN: [id number]  HPI  67 year old white male for followup regarding hypertension. Patient started on amlodipine 2.5 mg in addition to lisinopril 20 mg and metoprolol 50 mg twice daily. Patient tolerating calcium channel blocker without any side effects. Systolic blood pressure readings at home 130s systolic and 90s diastolic  Review of Systems  negative for chest pain or shortness of breath    Past Medical History  Diagnosis Date  . Acute prostatitis 02/04/2008  . ATRIAL MYXOMA 02/04/2008  . Closed fracture of four ribs 11/01/2009  . HYPERLIPIDEMIA 02/04/2008  . HYPERTENSION 02/04/2008  . LOC OSTEOARTHROS NOT SPEC PRIM/SEC LOWER LEG 04/18/2010  . UNS ADVRS EFF UNS RX MEDICINAL&BIOLOGICAL SBSTNC 02/04/2008    History   Social History  . Marital Status: Married    Spouse Name: N/A    Number of Children: N/A  . Years of Education: N/A   Occupational History  . married    Social History Main Topics  . Smoking status: Never Smoker   . Smokeless tobacco: Not on file  . Alcohol Use: No  . Drug Use: No  . Sexually Active: Yes   Other Topics Concern  . Not on file   Social History Narrative  . No narrative on file    Past Surgical History  Procedure Date  . Removal of atrial myxoma   . Cardio version for a fibi     Family History  Problem Relation Age of Onset  . Stroke Mother   . Heart disease Father   . Hypertension Father     No Known Allergies  Current Outpatient Prescriptions on File Prior to Visit  Medication Sig Dispense Refill  . amLODipine (NORVASC) 5 MG tablet Take 1 tablet (5 mg total) by mouth daily.  90 tablet  1  . aspirin 325 MG tablet Take 325 mg by mouth daily.        Marland Kitchen lisinopril (PRINIVIL,ZESTRIL) 20 MG tablet Take 1 tablet (20 mg total) by mouth daily.  90 tablet  1  . metoprolol (LOPRESSOR) 50 MG tablet Take 1 tablet (50 mg total) by mouth 2 (two) times daily.  180 tablet   1  . rosuvastatin (CRESTOR) 20 MG tablet Take 1 tablet (20 mg total) by mouth daily.  90 tablet  1    BP 146/80  Temp 97.7 F (36.5 C) (Oral)  Wt 182 lb (82.555 kg)    Objective:   Physical Exam  Constitutional: He is oriented to person, place, and time. He appears well-developed and well-nourished.  Cardiovascular: Normal rate, regular rhythm and normal heart sounds.   Pulmonary/Chest: Effort normal and breath sounds normal. He has no wheezes.  Musculoskeletal: He exhibits no edema.  Neurological: He is alert and oriented to person, place, and time. No cranial nerve deficit.  Psychiatric: He has a normal mood and affect. His behavior is normal.          Assessment & Plan:

## 2013-01-08 NOTE — Assessment & Plan Note (Signed)
Patient's electrolytes and kidney function are stable. Blood pressure improved but still suboptimal. Increase amlodipine to 5 mg. Continue lisinopril 20 mg and metoprolol 50 mg twice a day. Patient advised to call our office within 4-6 weeks with home blood pressure readings. Follow up with his primary care physician in 6 months.

## 2013-01-08 NOTE — Patient Instructions (Addendum)
Call our office with your blood pressure readings within 4-6 weeks

## 2013-01-16 ENCOUNTER — Other Ambulatory Visit: Payer: Self-pay

## 2013-01-29 ENCOUNTER — Other Ambulatory Visit: Payer: Self-pay | Admitting: Family

## 2013-07-09 ENCOUNTER — Ambulatory Visit: Payer: BC Managed Care – PPO | Admitting: Internal Medicine

## 2013-07-12 ENCOUNTER — Other Ambulatory Visit: Payer: Self-pay | Admitting: Internal Medicine

## 2013-10-07 ENCOUNTER — Other Ambulatory Visit: Payer: Self-pay

## 2013-10-19 ENCOUNTER — Other Ambulatory Visit: Payer: Self-pay | Admitting: *Deleted

## 2013-10-19 ENCOUNTER — Other Ambulatory Visit: Payer: Self-pay | Admitting: Internal Medicine

## 2013-10-19 MED ORDER — ROSUVASTATIN CALCIUM 20 MG PO TABS: 20.0000 mg | ORAL_TABLET | Freq: Every day | ORAL | Status: DC

## 2013-11-30 ENCOUNTER — Other Ambulatory Visit: Payer: Self-pay | Admitting: Internal Medicine

## 2013-12-07 ENCOUNTER — Other Ambulatory Visit: Payer: Self-pay | Admitting: Internal Medicine

## 2013-12-24 ENCOUNTER — Ambulatory Visit (INDEPENDENT_AMBULATORY_CARE_PROVIDER_SITE_OTHER): Payer: BC Managed Care – PPO | Admitting: Family Medicine

## 2013-12-24 ENCOUNTER — Encounter: Payer: Self-pay | Admitting: Family Medicine

## 2013-12-24 VITALS — BP 180/100 | HR 63 | Temp 97.6°F | Wt 180.0 lb

## 2013-12-24 DIAGNOSIS — I10 Essential (primary) hypertension: Secondary | ICD-10-CM

## 2013-12-24 DIAGNOSIS — N529 Male erectile dysfunction, unspecified: Secondary | ICD-10-CM

## 2013-12-24 MED ORDER — AMLODIPINE BESYLATE 5 MG PO TABS: 5.0000 mg | ORAL_TABLET | Freq: Every day | ORAL | Status: DC

## 2013-12-24 NOTE — Progress Notes (Signed)
Pre visit review using our clinic review tool, if applicable. No additional management support is needed unless otherwise documented below in the visit note. 

## 2013-12-24 NOTE — Progress Notes (Signed)
   Subjective:    Patient ID: Ethan Payne, male    DOB: Sep 29, 1946, 68 y.o.   MRN: [id number]  HPI Patient seen for hypertension assessment. He has history of atrial myxoma and bypass surgery in the past. He has taken lisinopril and metoprolol for several years. Last year he had addition of amlodipine and this was helping his blood pressure but he apparently had some erectile dysfunction stopped this on his own. Yesterday he had some mild headache and blood pressure 169/104. This morning blood pressure 170/108 by his blood pressure cuff. Denies chest pain. No peripheral edema. No nonsteroidal use. No alcohol use. No decongestant use. Compliant with medications.  He is having some occasional erectile dysfunction. He does not take nitroglycerin. Nonsmoker. No history of peripheral vascular disease  Past Medical History  Diagnosis Date  . Acute prostatitis 02/04/2008  . ATRIAL MYXOMA 02/04/2008  . Closed fracture of four ribs 11/01/2009  . HYPERLIPIDEMIA 02/04/2008  . HYPERTENSION 02/04/2008  . LOC OSTEOARTHROS NOT SPEC PRIM/SEC LOWER LEG 04/18/2010  . UNS ADVRS EFF UNS RX MEDICINAL&BIOLOGICAL SBSTNC 02/04/2008   Past Surgical History  Procedure Laterality Date  . Removal of atrial myxoma    . Cardio version for a fibi      reports that he has never smoked. He does not have any smokeless tobacco history on file. He reports that he does not drink alcohol or use illicit drugs. family history includes Heart disease in his father; Hypertension in his father; Stroke in his mother. No Known Allergies    Review of Systems  Constitutional: Negative for fatigue.  Eyes: Negative for visual disturbance.  Respiratory: Negative for cough, chest tightness and shortness of breath.   Cardiovascular: Negative for chest pain, palpitations and leg swelling.  Endocrine: Negative for polydipsia and polyuria.  Neurological: Positive for headaches. Negative for dizziness, syncope, weakness and light-headedness.        Objective:   Physical Exam  Constitutional: He appears well-developed and well-nourished.  Eyes: Pupils are equal, round, and reactive to light.  Neck: Neck supple. No thyromegaly present.  Cardiovascular: Normal rate.  Exam reveals no gallop and no friction rub.   Pulmonary/Chest: Effort normal and breath sounds normal. No respiratory distress. He has no wheezes. He has no rales.  Musculoskeletal: He exhibits no edema.  Lymphadenopathy:    He has no cervical adenopathy.          Assessment & Plan:  #1 hypertension. Poorly controlled. Add back amlodipine 5 mg daily and continue current doses of lisinopril and metoprolol. Reassess blood pressure with primary in 3 weeks. Limit sodium intake. #2 erectile dysfunction. Samples of Levitra 10 and 20 mg given he'll try to see if either dose helps his symptoms

## 2013-12-24 NOTE — Patient Instructions (Signed)
Levitra- may take either 10 or 20 mg once daily as needed for ED

## 2013-12-27 ENCOUNTER — Telehealth: Payer: Self-pay | Admitting: Internal Medicine

## 2013-12-27 NOTE — Telephone Encounter (Signed)
Relevant patient education assigned to patient using Emmi. ° °

## 2014-01-14 ENCOUNTER — Encounter: Payer: Self-pay | Admitting: Family Medicine

## 2014-01-14 ENCOUNTER — Ambulatory Visit (INDEPENDENT_AMBULATORY_CARE_PROVIDER_SITE_OTHER): Payer: Medicare Other | Admitting: Family Medicine

## 2014-01-14 VITALS — BP 148/80 | HR 94 | Temp 97.7°F | Wt 180.0 lb

## 2014-01-14 DIAGNOSIS — Z23 Encounter for immunization: Secondary | ICD-10-CM

## 2014-01-14 DIAGNOSIS — I10 Essential (primary) hypertension: Secondary | ICD-10-CM

## 2014-01-14 NOTE — Progress Notes (Signed)
   Subjective:    Patient ID: Ethan Payne, male    DOB: 1946/03/19, 68 y.o.   MRN: [id number]  HPI Patient is seen for hypertension followup He had severe elevation of 180/100 last visit. We added amlodipine 5 mg daily and he has tolerated well. Home blood pressure earlier today 137/87. He's not had any further headaches. No chest pains. No dizziness. No peripheral edema issues. He also remains on lisinopril and metoprolol.  Past Medical History  Diagnosis Date  . Acute prostatitis 02/04/2008  . ATRIAL MYXOMA 02/04/2008  . Closed fracture of four ribs 11/01/2009  . HYPERLIPIDEMIA 02/04/2008  . HYPERTENSION 02/04/2008  . LOC OSTEOARTHROS NOT SPEC PRIM/SEC LOWER LEG 04/18/2010  . UNS ADVRS EFF UNS RX MEDICINAL&BIOLOGICAL SBSTNC 02/04/2008   Past Surgical History  Procedure Laterality Date  . Removal of atrial myxoma    . Cardio version for a fibi      reports that he has never smoked. He does not have any smokeless tobacco history on file. He reports that he does not drink alcohol or use illicit drugs. family history includes Heart disease in his father; Hypertension in his father; Stroke in his mother. No Known Allergies    Review of Systems  Constitutional: Negative for fatigue.  Eyes: Negative for visual disturbance.  Respiratory: Negative for cough, chest tightness and shortness of breath.   Cardiovascular: Negative for chest pain, palpitations and leg swelling.  Endocrine: Negative for polydipsia and polyuria.  Neurological: Negative for dizziness, syncope, weakness, light-headedness and headaches.       Objective:   Physical Exam  Constitutional: He is oriented to person, place, and time. He appears well-developed and well-nourished.  HENT:  Right Ear: External ear normal.  Left Ear: External ear normal.  Mouth/Throat: Oropharynx is clear and moist.  Eyes: Pupils are equal, round, and reactive to light.  Neck: Neck supple. No thyromegaly present.  Cardiovascular: Normal  rate and regular rhythm.   Pulmonary/Chest: Effort normal and breath sounds normal. No respiratory distress. He has no wheezes. He has no rales.  Musculoskeletal: He exhibits no edema.  Neurological: He is alert and oriented to person, place, and time.          Assessment & Plan:  Hypertension. Improved. Continue current regimen. Continue home monitoring. Be in touch if consistently over 150/90. Followup with primary in 3 months

## 2014-01-14 NOTE — Patient Instructions (Signed)
Monitor blood pressure and be in touch if consistently > 150/90 

## 2014-01-14 NOTE — Progress Notes (Signed)
Pre visit review using our clinic review tool, if applicable. No additional management support is needed unless otherwise documented below in the visit note. 

## 2014-01-25 ENCOUNTER — Other Ambulatory Visit: Payer: Self-pay | Admitting: Internal Medicine

## 2014-04-11 ENCOUNTER — Ambulatory Visit: Payer: BC Managed Care – PPO | Admitting: Internal Medicine

## 2014-06-22 ENCOUNTER — Other Ambulatory Visit: Payer: Self-pay | Admitting: Internal Medicine

## 2014-07-20 ENCOUNTER — Other Ambulatory Visit: Payer: Self-pay | Admitting: Internal Medicine

## 2014-07-28 ENCOUNTER — Other Ambulatory Visit: Payer: Self-pay | Admitting: Internal Medicine

## 2014-08-01 ENCOUNTER — Emergency Department (HOSPITAL_COMMUNITY): Payer: Medicare Other

## 2014-08-01 ENCOUNTER — Emergency Department (HOSPITAL_COMMUNITY)
Admission: EM | Admit: 2014-08-01 | Discharge: 2014-08-01 | Disposition: A | Discharge status: Home / Self Care | Payer: Medicare Other | Attending: Emergency Medicine | Admitting: Emergency Medicine

## 2014-08-01 ENCOUNTER — Encounter (HOSPITAL_COMMUNITY): Payer: Self-pay | Admitting: Emergency Medicine

## 2014-08-01 DIAGNOSIS — Y92009 Unspecified place in unspecified non-institutional (private) residence as the place of occurrence of the external cause: Secondary | ICD-10-CM | POA: Diagnosis not present

## 2014-08-01 DIAGNOSIS — E785 Hyperlipidemia, unspecified: Secondary | ICD-10-CM | POA: Insufficient documentation

## 2014-08-01 DIAGNOSIS — Z79899 Other long term (current) drug therapy: Secondary | ICD-10-CM | POA: Diagnosis not present

## 2014-08-01 DIAGNOSIS — S51809A Unspecified open wound of unspecified forearm, initial encounter: Secondary | ICD-10-CM | POA: Diagnosis not present

## 2014-08-01 DIAGNOSIS — W450XXA Nail entering through skin, initial encounter: Secondary | ICD-10-CM

## 2014-08-01 DIAGNOSIS — Y9389 Activity, other specified: Secondary | ICD-10-CM | POA: Diagnosis not present

## 2014-08-01 DIAGNOSIS — Z7982 Long term (current) use of aspirin: Secondary | ICD-10-CM | POA: Diagnosis not present

## 2014-08-01 DIAGNOSIS — Z87448 Personal history of other diseases of urinary system: Secondary | ICD-10-CM | POA: Insufficient documentation

## 2014-08-01 DIAGNOSIS — Z23 Encounter for immunization: Secondary | ICD-10-CM | POA: Diagnosis not present

## 2014-08-01 DIAGNOSIS — W268XXA Contact with other sharp object(s), not elsewhere classified, initial encounter: Secondary | ICD-10-CM | POA: Insufficient documentation

## 2014-08-01 DIAGNOSIS — Z8781 Personal history of (healed) traumatic fracture: Secondary | ICD-10-CM | POA: Insufficient documentation

## 2014-08-01 DIAGNOSIS — I10 Essential (primary) hypertension: Secondary | ICD-10-CM | POA: Insufficient documentation

## 2014-08-01 MED ORDER — TETANUS-DIPHTH-ACELL PERTUSSIS 5-2.5-18.5 LF-MCG/0.5 IM SUSP
0.5000 mL | Freq: Once | INTRAMUSCULAR | Status: AC
  Administered 2014-08-01: 0.5 mL via INTRAMUSCULAR
  Filled 2014-08-01: qty 0.5

## 2014-08-01 MED ORDER — TRAMADOL HCL 50 MG PO TABS: 50.0000 mg | ORAL_TABLET | Freq: Four times a day (QID) | ORAL | Status: DC | PRN

## 2014-08-01 MED ORDER — ACETAMINOPHEN 325 MG PO TABS: 650.0000 mg | ORAL_TABLET | Freq: Four times a day (QID) | ORAL | Status: DC | PRN

## 2014-08-01 NOTE — ED Provider Notes (Signed)
CSN: [id number]     Arrival date & time 08/01/14  1316 History   First MD Initiated Contact with Patient 08/01/14 1438     Chief Complaint  Patient presents with  . Foreign Body in Skin     (Consider location/radiation/quality/duration/timing/severity/associated sxs/prior Treatment) HPI Comments: The patient is a 68 year old male presenting to the emergency department chief complaint of nail lodged in right forearm. The patient reports he was framing at home, just prior to arrival, when he grabbed the ladder and his nail gun went off and he was punctured through his right forearm. He reports minimal bleeding. He reports he is right-hand dominant. He denies other injury. He is not on any anticoagulation. He reports last tetanus 05/2014.  The history is provided by the patient. No language interpreter was used.    Past Medical History  Diagnosis Date  . Acute prostatitis 02/04/2008  . ATRIAL MYXOMA 02/04/2008  . Closed fracture of four ribs 11/01/2009  . HYPERLIPIDEMIA 02/04/2008  . HYPERTENSION 02/04/2008  . LOC OSTEOARTHROS NOT SPEC PRIM/SEC LOWER LEG 04/18/2010  . UNS ADVRS EFF UNS RX MEDICINAL&BIOLOGICAL SBSTNC 02/04/2008   Past Surgical History  Procedure Laterality Date  . Removal of atrial myxoma    . Cardio version for a fibi     Family History  Problem Relation Age of Onset  . Stroke Mother   . Heart disease Father   . Hypertension Father    History  Substance Use Topics  . Smoking status: Never Smoker   . Smokeless tobacco: Not on file  . Alcohol Use: No    Review of Systems See above   Allergies  Review of patient's allergies indicates no known allergies.  Home Medications   Prior to Admission medications   Medication Sig Start Date End Date Taking? Authorizing Provider  amLODipine (NORVASC) 5 MG tablet Take 1 tablet (5 mg total) by mouth daily. 12/24/13   Eulas Post, MD  aspirin 325 MG tablet Take 325 mg by mouth daily.      Historical Provider, MD  CRESTOR  20 MG tablet TAKE 1 TABLET (20 MG TOTAL) BY MOUTH DAILY. 06/22/14   Ricard Dillon, MD  lisinopril (PRINIVIL,ZESTRIL) 20 MG tablet TAKE 1 TABLET (20 MG TOTAL) BY MOUTH DAILY. 01/25/14   Ricard Dillon, MD  metoprolol (LOPRESSOR) 50 MG tablet TAKE 1 TABLET BY MOUTH TWICE A DAY 11/30/13   Ricard Dillon, MD   BP 170/90  Pulse 76  Temp(Src) 98.1 F (36.7 C) (Oral)  Resp 18  Wt 180 lb (81.647 kg)  SpO2 96% Physical Exam  Nursing note and vitals reviewed. Constitutional: He is oriented to person, place, and time. He appears well-developed and well-nourished.  Non-toxic appearance. He does not have a sickly appearance. He does not appear ill. No distress.  HENT:  Head: Normocephalic and atraumatic.  Neck: Neck supple.  Pulmonary/Chest: Effort normal. No respiratory distress.  Musculoskeletal:  Single 2.5 inch straight nail embedded into soft tissue with minimal bleeding on both ends of the nail. Normal sensation and range of motion distally. Good radial pulse.  Neurological: He is oriented to person, place, and time.  Skin: Skin is warm and dry. He is not diaphoretic.  Psychiatric: He has a normal mood and affect. His behavior is normal.    ED Course  FOREIGN BODY REMOVAL Date/Time: 08/01/2014 3:05 PM Performed by: Harvie Heck Authorized by: Harvie Heck Consent: Verbal consent obtained. Risks and benefits: risks, benefits and alternatives were discussed Consent  given by: patient Patient understanding: patient states understanding of the procedure being performed Patient consent: the patient's understanding of the procedure matches consent given Required items: required blood products, implants, devices, and special equipment available Patient identity confirmed: verbally with patient Time out: Immediately prior to procedure a "time out" was called to verify the correct patient, procedure, equipment, support staff and site/side marked as required. Body area: skin General location:  upper extremity Location details: right forearm Patient sedated: no Patient restrained: no Patient cooperative: yes Localization method: visualized Removal mechanism: hemostat Dressing: dressing applied Tendon involvement: none Depth: subcutaneous Complexity: simple 1 objects recovered. Objects recovered: Nail Post-procedure assessment: foreign body removed Patient tolerance: Patient tolerated the procedure well with no immediate complications. Comments: Extensive irrigation after foreign body removal.   (including critical care time) Labs Review Labs Reviewed - No data to display  Imaging Review Dg Forearm Right  08/01/2014   CLINICAL DATA:  Nail in arm  EXAM: RIGHT FOREARM - 2 VIEW  COMPARISON:  None.  FINDINGS: Three view exam of the right forearm shows a nail in the soft tissues of the distal forearm. No underlying bony fracture or abnormality.  IMPRESSION: No underlying acute bony abnormality.   Electronically Signed   By: Misty Stanley M.D.   On: 08/01/2014 14:27     EKG Interpretation None      MDM   Final diagnoses:  Nail, injury by, initial encounter   Patient presents with framing nail and right forearm, removed, neurovascularly intact distally. Will discharge with return precautions of infection. Patient reports having last tetanus 05/22/2009, unable to locate where her on EMR, scant encounter does not state tetanus was given and given patient's age and he is slightly over 5 years will administer Td today. Dr. Rogene Houston also evaluated the patient during this encounter. Discussed imaging results, and treatment plan with the patient. Return precautions given. Reports understanding and no other concerns at this time.  Patient is stable for discharge at this time. Meds given in ED:  Medications  Tdap (BOOSTRIX) injection 0.5 mL (0.5 mLs Intramuscular Given 08/01/14 1538)    New Prescriptions   ACETAMINOPHEN (TYLENOL) 325 MG TABLET    Take 2 tablets (650 mg total) by  mouth every 6 (six) hours as needed.   TRAMADOL (ULTRAM) 50 MG TABLET    Take 1 tablet (50 mg total) by mouth every 6 (six) hours as needed.       Harvie Heck, PA-C 08/01/14 1610

## 2014-08-01 NOTE — ED Notes (Signed)
Pt today was using a nail gun with his left hand, put a nail through his right forearm. Appears to have penetrated the skin only. Sensation intact. No bleeding at current.

## 2014-08-01 NOTE — ED Notes (Signed)
Pt remains monitored by blood pressure and pulse ox.  

## 2014-08-01 NOTE — Discharge Instructions (Signed)
Call for a follow up appointment with a Family or Primary Care Provider.  Return if Symptoms worsen.   Take medication as prescribed.  Keep wound clean and dry.

## 2014-08-01 NOTE — ED Provider Notes (Signed)
Medical screening examination/treatment/procedure(s) were conducted as a shared visit with non-physician practitioner(s) and myself.  I personally evaluated the patient during the encounter.   EKG Interpretation None      Patient seen by me. Patient status post nail gun nail through right forearm ulnar side somewhat superficial but through the skin. X-ray showed no bony involvement. Patient's ulnar pulse is 2+. Good cap refill to the fingers. No neuro deficit along the ulnar median or radial nerve parts of the hand. Patient's tetanus is been verified to be up-to-date. Nail was removed without any complications. Patient can be discharged home.  Fredia Sorrow, MD 08/01/14 [phone]

## 2014-08-19 ENCOUNTER — Other Ambulatory Visit: Payer: Self-pay | Admitting: Internal Medicine

## 2014-11-04 ENCOUNTER — Encounter: Payer: Self-pay | Admitting: Family Medicine

## 2014-11-04 ENCOUNTER — Ambulatory Visit (INDEPENDENT_AMBULATORY_CARE_PROVIDER_SITE_OTHER): Payer: Medicare Other | Admitting: Family Medicine

## 2014-11-04 VITALS — BP 130/90 | Temp 98.1°F | Wt 176.0 lb

## 2014-11-04 DIAGNOSIS — H6123 Impacted cerumen, bilateral: Secondary | ICD-10-CM

## 2014-11-04 NOTE — Progress Notes (Signed)
   Subjective:    Patient ID: Barbaraann Boys, male    DOB: Feb 02, 1946, 68 y.o.   MRN: [id number]  HPI Here for several days of ringing in the right ear. This is usually the result of feedback from his hearing aid when wax builds up in his ear canals. There is no pain.   Review of Systems  Constitutional: Negative.   HENT: Positive for tinnitus. Negative for ear discharge and ear pain.   Eyes: Negative.   Respiratory: Negative.        Objective:   Physical Exam  Constitutional: He appears well-developed and well-nourished.  HENT:  Head: Normocephalic and atraumatic.  Nose: Nose normal.  Mouth/Throat: Oropharynx is clear and moist.  Both ear canals have cerumen present   Eyes: Conjunctivae are normal.  Lymphadenopathy:    He has no cervical adenopathy.          Assessment & Plan:  The cerumen was irrigated clear with water. After that he could hear well with no ringing.

## 2014-11-04 NOTE — Progress Notes (Signed)
Pre visit review using our clinic review tool, if applicable. No additional management support is needed unless otherwise documented below in the visit note. 

## 2014-11-14 ENCOUNTER — Telehealth: Payer: Self-pay

## 2014-11-14 MED ORDER — LISINOPRIL 20 MG PO TABS: 20.0000 mg | ORAL_TABLET | Freq: Every day | ORAL | Status: DC

## 2014-11-14 NOTE — Telephone Encounter (Signed)
Medication refilled

## 2014-11-14 NOTE — Telephone Encounter (Signed)
Rx request for lisinopril 20 mg tablet- Take 1 tablet by mouth every day #90  Pharm:  [facility] [address]  Pls advise.

## 2014-12-30 ENCOUNTER — Other Ambulatory Visit: Payer: Self-pay | Admitting: *Deleted

## 2014-12-30 MED ORDER — METOPROLOL TARTRATE 50 MG PO TABS: 50.0000 mg | ORAL_TABLET | Freq: Two times a day (BID) | ORAL | Status: DC

## 2014-12-30 NOTE — Telephone Encounter (Signed)
Patient has appt with Dr Yong Channel on 04/10/2015. [facility] Met[facility]rolol 50mg -1 by mouth twice a day-#60.

## 2015-01-07 ENCOUNTER — Other Ambulatory Visit: Payer: Self-pay | Admitting: Family Medicine

## 2015-01-19 ENCOUNTER — Telehealth: Payer: Self-pay | Admitting: Family Medicine

## 2015-01-19 NOTE — Telephone Encounter (Signed)
Noted  

## 2015-01-19 NOTE — Telephone Encounter (Signed)
Patient Name: Ethan Payne  DOB: 1946/10/18    Initial Comment Caller states, has a lot of blood pressure issues, ( he has not taken it today) he was trans to [place] from the appt line. His next appt with Dr Elease Hashimoto at 2pm today.    Nurse Assessment  Nurse: Mallie Mussel, RN, Alveta Heimlich Date/Time Eilene Ghazi Time): 01/19/2015 1:25:08 PM  Confirm and document reason for call. If symptomatic, describe symptoms. ---I began speaking with the caller's wife but he came in from work while I was on the phone with her. Caller states that he is currently taking 3 BP pills and has been having dizziness for the past 3 weeks. Current BP is 158/96 P 90.  Has the patient traveled out of the country within the last 30 days? ---No  Does the patient require triage? ---Yes  Related visit to physician within the last 2 weeks? ---No  Does the PT have any chronic conditions? (i.e. diabetes, asthma, etc.) ---Yes  List chronic conditions. ---HTN, Hypercholesterolemia     Guidelines    Guideline Title Affirmed Question Affirmed Notes  High Blood Pressure [1] Taking BP medications AND [2] feels is having side effects (e.g., impotence, cough, dizzy upon standing)    Final Disposition User   See PCP When Office is Open (within 3 days) Mallie Mussel, RN, Alveta Heimlich    Comments  An appointment was made for Friday at 11:00 with Dr. Burnice Logan

## 2015-01-20 ENCOUNTER — Encounter: Payer: Self-pay | Admitting: Internal Medicine

## 2015-01-20 ENCOUNTER — Ambulatory Visit (INDEPENDENT_AMBULATORY_CARE_PROVIDER_SITE_OTHER): Payer: BLUE CROSS/BLUE SHIELD | Admitting: Internal Medicine

## 2015-01-20 VITALS — BP 130/78 | HR 56 | Temp 97.9°F | Wt 176.0 lb

## 2015-01-20 DIAGNOSIS — I10 Essential (primary) hypertension: Secondary | ICD-10-CM

## 2015-01-20 NOTE — Patient Instructions (Signed)
Limit your sodium (Salt) intake  Please check your blood pressure on a regular basis.  If it is consistently greater than 150/90, please increase amlodipine to 10 mg daily  Decrease metoprolol to 25 mg twice daily for 2 weeks, then decrease to 25 mg once daily in the morning for 2 weeks, then discontinue  Follow-up with Dr. Yong Channel in 4 weeks

## 2015-01-20 NOTE — Progress Notes (Signed)
Pre visit review using our clinic review tool, if applicable. No additional management support is needed unless otherwise documented below in the visit note. 

## 2015-01-20 NOTE — Progress Notes (Signed)
Subjective:    Patient ID: Ethan Payne, male    DOB: Nov 25, 1946, 69 y.o.   MRN: [id number]  HPI  DATE OF ADMISSION: 09/25/2006    DISCHARGE SUMMARY  PRIMARY DIAGNOSES: 1. Left atrial myxoma, benign. 2. New onset atrial fibrillation. 3. Single vessel coronary artery disease. 4. Tachycardia-induced cardiomyopathy.  IN-HOSPITAL DIAGNOSES: 1. Acute blood loss anemia postoperatively. 2. Class II systolic congestive heart failure.  SECONDARY DIAGNOSES: 1. Hypertension. 2. Hyperlipidemia.  OPERATIONS AND PROCEDURES: 1. Cardiac catheterization, left and right heart catheterization, coronary  arteriography. 2. A 2-D echocardiogram. 3. Section of left atrial myxoma pericardial patch reconstruction of the  interatrial septum. 4. Modified Cox maze fluoroscopic procedure. 5. Coronary artery bypass grafting x2 using a saphenous vein graft to  ramus intermediate branch with sequential saphenous vein graft to  circumflex marginal branch. 6. Endoscopic saphenous vein harvest from right leg.  69 year old patient with a complicated cardiac history.  He has essential hypertension and has been on triple therapy including metoprolol 25 mg twice a day Complaints include diminished energy level that usually occurs in the late afternoon.  He is still employed as a Chief Strategy Officer and quite active.  He describes episodes of dizziness usually the morning when he drives to work.  He describes poor energy level and has a chief complaint of erectile dysfunction.  He does monitor home blood sugar readings and has not noted any low blood pressure readings.  Blood pressures generally well controlled from normal to high normal readings.  He is scheduled for follow-up with his PCP in May  Past Medical History  Diagnosis Date  . Acute prostatitis 02/04/2008  . ATRIAL MYXOMA 02/04/2008  . Closed fracture of four ribs 11/01/2009  .  HYPERLIPIDEMIA 02/04/2008  . HYPERTENSION 02/04/2008  . LOC OSTEOARTHROS NOT SPEC PRIM/SEC LOWER LEG 04/18/2010  . UNS ADVRS EFF UNS RX MEDICINAL&BIOLOGICAL SBSTNC 02/04/2008    History   Social History  . Marital Status: Married    Spouse Name: N/A  . Number of Children: N/A  . Years of Education: N/A   Occupational History  . married    Social History Main Topics  . Smoking status: Never Smoker   . Smokeless tobacco: Not on file  . Alcohol Use: No  . Drug Use: No  . Sexual Activity: Yes   Other Topics Concern  . Not on file   Social History Narrative    Past Surgical History  Procedure Laterality Date  . Removal of atrial myxoma    . Cardio version for a fibi      Family History  Problem Relation Age of Onset  . Stroke Mother   . Heart disease Father   . Hypertension Father     Allergies  Allergen Reactions  . Claritin [Loratadine] Other (See Comments)    Heart speeds up    Current Outpatient Prescriptions on File Prior to Visit  Medication Sig Dispense Refill  . acetaminophen (TYLENOL) 325 MG tablet Take 2 tablets (650 mg total) by mouth every 6 (six) hours as needed. 20 tablet 0  . amLODipine (NORVASC) 5 MG tablet TAKE 1 TABLET (5 MG TOTAL) BY MOUTH DAILY. 90 tablet 0  . aspirin 325 MG tablet Take 325 mg by mouth daily.      Marland Kitchen lisinopril (PRINIVIL,ZESTRIL) 20 MG tablet Take 1 tablet (20 mg total) by mouth daily. 90 tablet 1  . metoprolol (LOPRESSOR) 50 MG tablet Take 1 tablet (50 mg total) by mouth 2 (two) times daily. [place]  tablet 1  . rosuvastatin (CRESTOR) 20 MG tablet Take 20 mg by mouth every other day.     No current facility-administered medications on file prior to visit.    BP 130/78 mmHg  Pulse 56  Temp(Src) 97.9 F (36.6 C) (Oral)  Wt 176 lb (79.833 kg)  SpO2 97%     Review of Systems  Constitutional: Positive for fatigue. Negative for fever, chills and appetite change.  HENT: Negative for congestion, dental problem, ear pain, hearing  loss, sore throat, tinnitus, trouble swallowing and voice change.   Eyes: Negative for pain, discharge and visual disturbance.  Respiratory: Negative for cough, chest tightness, wheezing and stridor.   Cardiovascular: Negative for chest pain, palpitations and leg swelling.  Gastrointestinal: Negative for nausea, vomiting, abdominal pain, diarrhea, constipation, blood in stool and abdominal distention.  Genitourinary: Negative for urgency, hematuria, flank pain, discharge, difficulty urinating and genital sores.  Musculoskeletal: Negative for myalgias, back pain, joint swelling, arthralgias, gait problem and neck stiffness.  Skin: Negative for rash.  Neurological: Positive for dizziness. Negative for syncope, speech difficulty, weakness, numbness and headaches.  Hematological: Negative for adenopathy. Does not bruise/bleed easily.  Psychiatric/Behavioral: Negative for behavioral problems and dysphoric mood. The patient is not nervous/anxious.        Objective:   Physical Exam  Constitutional: He is oriented to person, place, and time. He appears well-developed.  Blood pressure 130/78 on arrival Blood pressure by my exam 150 over 90 without orthostatic change  HENT:  Head: Normocephalic.  Right Ear: External ear normal.  Left Ear: External ear normal.  Eyes: Conjunctivae and EOM are normal.  Neck: Normal range of motion.  Cardiovascular: Normal rate and normal heart sounds.   Pulmonary/Chest: Breath sounds normal.  Abdominal: Bowel sounds are normal.  Musculoskeletal: Normal range of motion. He exhibits no edema or tenderness.  Neurological: He is alert and oriented to person, place, and time.  Psychiatric: He has a normal mood and affect. His behavior is normal.          Assessment & Plan:   Multiple somatic complaints of decreased energy level, weakness, dizziness, and erectile dysfunction. We'll give the patient a trial off beta blocker therapy and we'll taper and  discontinue. Increase amlodipine to 10 mg daily Recheck 4 weeks Viagra trial  Careful home blood pressure monitoring

## 2015-02-17 ENCOUNTER — Encounter: Payer: Self-pay | Admitting: Family Medicine

## 2015-02-17 ENCOUNTER — Ambulatory Visit (INDEPENDENT_AMBULATORY_CARE_PROVIDER_SITE_OTHER): Payer: BLUE CROSS/BLUE SHIELD | Admitting: Family Medicine

## 2015-02-17 VITALS — BP 130/80 | HR 62 | Temp 98.1°F | Wt 175.0 lb

## 2015-02-17 DIAGNOSIS — I2510 Atherosclerotic heart disease of native coronary artery without angina pectoris: Secondary | ICD-10-CM | POA: Insufficient documentation

## 2015-02-17 DIAGNOSIS — I10 Essential (primary) hypertension: Secondary | ICD-10-CM

## 2015-02-17 DIAGNOSIS — N5201 Erectile dysfunction due to arterial insufficiency: Secondary | ICD-10-CM

## 2015-02-17 NOTE — Patient Instructions (Addendum)
Please check your blood pressure on a regular basis. Daily for at least a week. If it is consistently greater than 145/90, please increase amlodipine to 10 mg daily. After a week, can check every other day or so. Use same guidelines of 145/90 to increase in future if needed.   Continue lisinopril and amlodipine alone for now.   I will see you in May and we can review the rest of your history.   [name] will set you up with stool cards.   You received pneumonia shot today. Since you got your first pneumonia shot before age 77, you will need 1 final pneumonia shot.

## 2015-02-17 NOTE — Progress Notes (Signed)
  Garret Reddish, MD Phone: [phone]  Subjective:   Ethan Payne is a 69 y.o. year old very pleasant male patient who presents with the following:  CAD- stable and medically managed-history bypass on 2 vessels Hypertension-controlled on lisinopril 20mg  and amlodipine 5mg  No longer having the decreased energy levels and dizziness that he had on metoprolol.  BP Readings from Last 3 Encounters:  02/17/15 130/80  01/20/15 130/78  11/04/14 130/90   Home BP monitoring-BP ranges from 117-144 with most readings in 353I or 144R, diastolic >15% of time below 90, other times slight elevation. HR from 71-98 Compliant with medications-yes without side effects ROS-Denies any CP, HA, SOB, blurry vision, LE edema  Erectile Dysfunction- poor control Did not try viagra yet, wonders if he can try 1/2 pill. He says there is one vein on the side of his penis that takes a few hours to go down after an erectio nand is slightly painful this has made him tentative to trial.   Past Medical History- hx atrial myxoma s/p excision, CAD, HLD, HTN  Medications- reviewed and updated Current Outpatient Prescriptions  Medication Sig Dispense Refill  . amLODipine (NORVASC) 5 MG tablet TAKE 1 TABLET (5 MG TOTAL) BY MOUTH DAILY. 90 tablet 0  . aspirin 325 MG tablet Take 325 mg by mouth daily.      Marland Kitchen lisinopril (PRINIVIL,ZESTRIL) 20 MG tablet Take 1 tablet (20 mg total) by mouth daily. 90 tablet 1  . metoprolol (LOPRESSOR) 50 MG tablet Take 1 tablet (50 mg total) by mouth 2 (two) times daily. 60 tablet 1  . rosuvastatin (CRESTOR) 20 MG tablet Take 20 mg by mouth every other day.    Marland Kitchen acetaminophen (TYLENOL) 325 MG tablet Take 2 tablets (650 mg total) by mouth every 6 (six) hours as needed. (Patient not taking: Reported on 02/17/2015) 20 tablet 0   Objective: BP 130/80 mmHg  Pulse 62  Temp(Src) 98.1 F (36.7 C)  Wt 175 lb (79.379 kg) Gen: NAD, resting comfortably, appears stated age CV: RRR no murmurs rubs or  gallops Lungs: CTAB no crackles, wheeze, rhonchi Abdomen: soft/nontender/nondistended/normal bowel sounds. No rebound or guarding.  Ext: no edema Skin: warm, dry, no rash  Neuro: grossly normal, moves all extremities, normal gait   Assessment/Plan:  Erectile dysfunction I offered him referral to urology given the vessel on this side of his penis that takes longer to become soft after an erection but he declines. He asks if he can trial a 1/2 of the viagra he was given (not sure if 50 or 100mg ). I told him he could try this but gave strict reasons to seek care.    Essential hypertension Reasonable control on Lisinopril 20mg  and amlodipine 5mg . A few systolic readings at home slightly high. Given CAD history I advised him to start the full 10mg  of amlodipine if BP above 145/90 consistently.    CAD (coronary artery disease) Continue medical therapy with aspirin, statin, BP control. Asymptomatic.    6 month follow up

## 2015-02-18 DIAGNOSIS — N529 Male erectile dysfunction, unspecified: Secondary | ICD-10-CM | POA: Insufficient documentation

## 2015-02-18 DIAGNOSIS — Z0000 Encounter for general adult medical examination without abnormal findings: Secondary | ICD-10-CM | POA: Insufficient documentation

## 2015-02-18 NOTE — Assessment & Plan Note (Signed)
Reasonable control on Lisinopril 20mg  and amlodipine 5mg . A few systolic readings at home slightly high. Given CAD history I advised him to start the full 10mg  of amlodipine if BP above 145/90 consistently.

## 2015-02-18 NOTE — Assessment & Plan Note (Signed)
Continue medical therapy with aspirin, statin, BP control. Asymptomatic.

## 2015-02-18 NOTE — Assessment & Plan Note (Signed)
I offered him referral to urology given the vessel on this side of his penis that takes longer to become soft after an erection but he declines. He asks if he can trial a 1/2 of the viagra he was given (not sure if 50 or 100mg ). I told him he could try this but gave strict reasons to seek care.

## 2015-02-20 ENCOUNTER — Other Ambulatory Visit: Payer: Self-pay

## 2015-02-20 MED ORDER — ROSUVASTATIN CALCIUM 20 MG PO TABS: 20.0000 mg | ORAL_TABLET | ORAL | Status: DC

## 2015-02-20 NOTE — Telephone Encounter (Signed)
Rx request for Crestor 20 mg tablet-Take 1 tablet by mouth daily #90  Pharm:  [facility] [address]

## 2015-02-20 NOTE — Progress Notes (Signed)
Called [facility] and [facility] to try to obtain colonoscopy records on pt. [facility] have no record of ever seeing pt.

## 2015-04-01 ENCOUNTER — Other Ambulatory Visit: Payer: Self-pay | Admitting: Family Medicine

## 2015-04-10 ENCOUNTER — Encounter: Payer: Self-pay | Admitting: Family Medicine

## 2015-04-10 ENCOUNTER — Ambulatory Visit (INDEPENDENT_AMBULATORY_CARE_PROVIDER_SITE_OTHER): Payer: Medicare Other | Admitting: Family Medicine

## 2015-04-10 VITALS — BP 124/82 | HR 94 | Temp 98.3°F | Wt 175.0 lb

## 2015-04-10 DIAGNOSIS — R351 Nocturia: Secondary | ICD-10-CM | POA: Diagnosis not present

## 2015-04-10 DIAGNOSIS — N401 Enlarged prostate with lower urinary tract symptoms: Secondary | ICD-10-CM

## 2015-04-10 DIAGNOSIS — K219 Gastro-esophageal reflux disease without esophagitis: Secondary | ICD-10-CM

## 2015-04-10 DIAGNOSIS — I10 Essential (primary) hypertension: Secondary | ICD-10-CM

## 2015-04-10 DIAGNOSIS — Z1211 Encounter for screening for malignant neoplasm of colon: Secondary | ICD-10-CM

## 2015-04-10 DIAGNOSIS — E785 Hyperlipidemia, unspecified: Secondary | ICD-10-CM

## 2015-04-10 DIAGNOSIS — I2510 Atherosclerotic heart disease of native coronary artery without angina pectoris: Secondary | ICD-10-CM

## 2015-04-10 LAB — CBC
HCT: 43.2 % (ref 39.0–52.0)
Hemoglobin: 14.9 g/dL (ref 13.0–17.0)
MCHC: 34.5 g/dL (ref 30.0–36.0)
MCV: 88.5 fl (ref 78.0–100.0)
Platelets: 332 K/uL (ref 150.0–400.0)
RBC: 4.89 Mil/uL (ref 4.22–5.81)
RDW: 13.1 % (ref 11.5–15.5)
WBC: 6.9 K/uL (ref 4.0–10.5)

## 2015-04-10 LAB — COMPREHENSIVE METABOLIC PANEL WITH GFR
ALT: 25 U/L (ref 0–53)
AST: 23 U/L (ref 0–37)
Albumin: 4.3 g/dL (ref 3.5–5.2)
Alkaline Phosphatase: 42 U/L (ref 39–117)
BUN: 13 mg/dL (ref 6–23)
CO2: 31 meq/L (ref 19–32)
Calcium: 10 mg/dL (ref 8.4–10.5)
Chloride: 101 meq/L (ref 96–112)
Creatinine, Ser: 1.02 mg/dL (ref 0.40–1.50)
GFR: 77.55 mL/min
Glucose, Bld: 101 mg/dL — ABNORMAL HIGH (ref 70–99)
Potassium: 4.2 meq/L (ref 3.5–5.1)
Sodium: 137 meq/L (ref 135–145)
Total Bilirubin: 0.8 mg/dL (ref 0.2–1.2)
Total Protein: 7.5 g/dL (ref 6.0–8.3)

## 2015-04-10 LAB — TSH: TSH: 2.03 u[IU]/mL (ref 0.35–4.50)

## 2015-04-10 LAB — LIPID PANEL
Cholesterol: 204 mg/dL — ABNORMAL HIGH (ref 0–200)
HDL: 47.4 mg/dL (ref >39.00)
LDL CALC: 131 mg/dL — AB (ref 0–99)
NonHDL: 156.6
Total CHOL/HDL Ratio: 4
Triglycerides: 128 mg/dL (ref 0.0–149.0)
VLDL: 25.6 mg/dL (ref 0.0–40.0)

## 2015-04-10 LAB — PSA: PSA: 3.08 ng/mL (ref 0.10–4.00)

## 2015-04-10 NOTE — Assessment & Plan Note (Signed)
Controlled on Lisinopril 20mg  and amlodipine 5mg  alone. HR at times in 90s at home and DBP sparingly into 90s at home- will check both in 6 months as patient monitors at home.   SE on metoprolol-dizzy and decreased energy resolved off medicine but would consider Toprol 25mg  XL instead of metoprolol 50mg  BID.

## 2015-04-10 NOTE — Assessment & Plan Note (Signed)
Continue asa, BP, lipid control. Needs tighter lipid control as LDL aroudn 130.

## 2015-04-10 NOTE — Patient Instructions (Addendum)
Check with insurance regarding Zostavax  Update fasting bloodwork  See me back in 6 months. Keep an eye on your blood pressure for me as well as your heart rate. If you have more than 20% of your readings above 90 heart rate or above 90 bottom number blood pressure, may start a low extended release metoprolol Toprol XL 25mg . I think this would make you less fatigued.   Bring back the stool cards. I will continue to press for a colonoscopy in the next year or two.

## 2015-04-10 NOTE — Progress Notes (Signed)
Ethan Reddish, MD Phone: [phone]  Subjective:  Patient presents today to establish care with me as their new primary care provider. Patient was formerly a patient of Dr. Arnoldo Morale. Chief complaint-noted.   CAD-asymptomatic Hypertension-controlled Hyperlipidemia-poor control  BP Readings from Last 3 Encounters:  04/10/15 124/82  02/17/15 130/80  01/20/15 130/78  Compliant with medications-yes without side effects, amlodipine 5mg , asa 325, lisinopril 20mg , crestor only twice a week Stopped metoprolol due to fatigue and dizziness which both resolved ROS-Denies any CP, HA, SOB, blurry vision, LE edema, transient weakness, orthopnea, PND.   The following were reviewed and entered/updated in epic: Past Medical History  Diagnosis Date  . Acute prostatitis 02/04/2008  . ATRIAL MYXOMA 02/04/2008  . Closed fracture of four ribs 11/01/2009  . HYPERLIPIDEMIA 02/04/2008  . HYPERTENSION 02/04/2008  . Osteoarthritis of right knee 04/18/2010   Patient Active Problem List   Diagnosis Date Noted  . CAD (coronary artery disease) 02/17/2015    Priority: High  . s/p excision of Atrial myxoma 02/04/2008    Priority: High  . Hyperlipidemia 02/04/2008    Priority: Medium  . Essential hypertension 02/04/2008    Priority: Medium  . Health care maintenance 02/18/2015    Priority: Low  . Erectile dysfunction 02/18/2015    Priority: Low  . Heel pain 03/22/2011    Priority: Low  . GERD (gastroesophageal reflux disease) 03/22/2011    Priority: Low  . Osteoarthritis of right knee 04/18/2010    Priority: Low  . BPH associated with nocturia 04/10/2015   Past Surgical History  Procedure Laterality Date  . Removal of atrial myxoma    . Cardio version for a fibi    . Coronary artery bypass graft      2 vessel during atrial myxoma procedure    Family History  Problem Relation Age of Onset  . Stroke Mother   . Heart disease Father     MI 69  . Hypertension Father   . Ovarian cancer Sister      Medications- reviewed and updated Current Outpatient Prescriptions  Medication Sig Dispense Refill  . amLODipine (NORVASC) 5 MG tablet TAKE 1 TABLET (5 MG TOTAL) BY MOUTH DAILY. 90 tablet 0  . aspirin 325 MG tablet Take 325 mg by mouth daily.      Marland Kitchen lisinopril (PRINIVIL,ZESTRIL) 20 MG tablet Take 1 tablet (20 mg total) by mouth daily. 90 tablet 1  . metoprolol (LOPRESSOR) 50 MG tablet TAKE 1 TABLET (50 MG TOTAL) BY MOUTH 2 (TWO) TIMES DAILY. 60 tablet 3  . rosuvastatin (CRESTOR) 20 MG tablet Take 1 tablet (20 mg total) by mouth every other day. 90 tablet 3  . acetaminophen (TYLENOL) 325 MG tablet Take 2 tablets (650 mg total) by mouth every 6 (six) hours as needed. (Patient not taking: Reported on 02/17/2015) 20 tablet 0   No current facility-administered medications for this visit.    Allergies-reviewed and updated Allergies  Allergen Reactions  . Claritin [Loratadine] Other (See Comments)    Heart speeds up  . Hydrocodone     After rib fractures. Hydrocodone-States had shortness of breath, 911 was called    History   Social History  . Marital Status: Married    Spouse Name: Ethan Payne  . Number of Children: Ethan Payne  . Years of Education: Ethan Payne   Occupational History  . married    Social History Main Topics  . Smoking status: Never Smoker   . Smokeless tobacco: Not on file  . Alcohol Use: No  .  Drug Use: No  . Sexual Activity: Yes   Other Topics Concern  . None   Social History Narrative   Married 47 years in 2016 (wife seen at another practice). 2 children-daughter unmarried, professional student 77 and son 57 in 2016. 2 grandsons-12 and 6 in 2016.       Still works as Clinical biochemist, does his own carpentry work      Hobbies: fishing    ROS--See HPI   Objective: BP 124/82 mmHg  Pulse 94  Temp(Src) 98.3 F (36.8 C)  Wt 175 lb (79.379 kg) Gen: NAD, resting comfortably HEENT: Mucous membranes are moist. Oropharynx normal CV: RRR no murmurs rubs or  gallops Lungs: CTAB no crackles, wheeze, rhonchi Abdomen: soft/nontender/nondistended/normal bowel sounds.  Rectal: normal tone, diffusely enlarged prostate, no masses or tenderness Ext: no edema Skin: warm, dry, no rash Neuro: grossly normal, moves all extremities, normal gait   Assessment/Plan:  CAD (coronary artery disease) Continue asa, BP, lipid control. Needs tighter lipid control as LDL aroudn 130.    Hyperlipidemia With CAD history, LDL goal at least least less than 100. Increase crestor to 4x a week from twice a week and check in 6 months.    Essential hypertension Controlled on Lisinopril 20mg  and amlodipine 5mg  alone. HR at times in 90s at home and DBP sparingly into 90s at home- will check both in 6 months as patient monitors at home.   SE on metoprolol-dizzy and decreased energy resolved off medicine but would consider Toprol 25mg  XL instead of metoprolol 50mg  BID.    6 month follow up.   Lab notes sent to patient Abnormal results 1. PSA has doubled over 3 years which is faster than desired. Compared to value 4 years ago at 2.41 it has increased a reasonable amount though. This is very difficult to interpret. We have 2 options here- refer you to urology or recheck in a month. [name] can set you up with either one that you decide.  2. Blood sugar is elevated. Need to make sure to exercise regularly, eat healthy, maintain or lose a few lbs.  3. Cholesterol is up and with your history of bypass definitely want these levels lower. I would increase the crestor to 4x a week.   Blood counts normal. Kidney, liver, electrolytes normal.

## 2015-04-10 NOTE — Assessment & Plan Note (Signed)
With CAD history, LDL goal at least least less than 100. Increase crestor to 4x a week from twice a week and check in 6 months.

## 2015-04-11 ENCOUNTER — Telehealth: Payer: Self-pay

## 2015-04-11 DIAGNOSIS — R972 Elevated prostate specific antigen [PSA]: Secondary | ICD-10-CM

## 2015-04-11 NOTE — Telephone Encounter (Signed)
Future lab entered.  

## 2015-05-16 ENCOUNTER — Other Ambulatory Visit: Payer: Self-pay | Admitting: Family Medicine

## 2015-06-21 ENCOUNTER — Other Ambulatory Visit: Payer: Self-pay | Admitting: Family Medicine

## 2015-11-15 ENCOUNTER — Other Ambulatory Visit: Payer: Self-pay | Admitting: Family Medicine

## 2016-01-22 ENCOUNTER — Telehealth: Payer: Self-pay | Admitting: Family Medicine

## 2016-01-22 NOTE — Telephone Encounter (Signed)
Pt states he thinks he has bronchitis. Would like to see dr hunter tomorrow or wed is ok Is it ok to work in?

## 2016-01-22 NOTE — Telephone Encounter (Signed)
Pt has been scheduled.  °

## 2016-01-22 NOTE — Telephone Encounter (Signed)
You an use the SDA for tomorrow

## 2016-01-23 ENCOUNTER — Ambulatory Visit (INDEPENDENT_AMBULATORY_CARE_PROVIDER_SITE_OTHER): Payer: Medicare Other | Admitting: Family Medicine

## 2016-01-23 ENCOUNTER — Encounter: Payer: Self-pay | Admitting: Family Medicine

## 2016-01-23 VITALS — BP 140/74 | HR 94 | Temp 97.9°F | Wt 179.0 lb

## 2016-01-23 DIAGNOSIS — R059 Cough, unspecified: Secondary | ICD-10-CM

## 2016-01-23 DIAGNOSIS — R0981 Nasal congestion: Secondary | ICD-10-CM

## 2016-01-23 DIAGNOSIS — R05 Cough: Secondary | ICD-10-CM | POA: Diagnosis not present

## 2016-01-23 DIAGNOSIS — J3489 Other specified disorders of nose and nasal sinuses: Secondary | ICD-10-CM | POA: Diagnosis not present

## 2016-01-23 MED ORDER — HYDROCODONE-HOMATROPINE 5-1.5 MG/5ML PO SYRP: 2.5000 mL | ORAL_SOLUTION | Freq: Four times a day (QID) | ORAL | Status: DC | PRN

## 2016-01-23 NOTE — Progress Notes (Signed)
PCP: Garret Reddish, MD  Subjective:  Ethan Payne is a 70 y.o. year old very pleasant male patient who presents with URI vs. sinusitis symptoms including nasal congestion, sinus tenderness, cough productive of green sputum -other symptoms include: mild sore throat. Prolonged coughing fits especially at night.  -day of illness:4 -started: Sunday -Symptoms show no change -previous treatments: tylenol for pressure, took one dose of old hydrocodone for cough and helped him yesterday  denies flu exposure.   ROS-denies fever, SOB, NVD, tooth pain  Pertinent Past Medical History-  Patient Active Problem List   Diagnosis Date Noted  . CAD (coronary artery disease) 02/17/2015    Priority: High  . s/p excision of Atrial myxoma 02/04/2008    Priority: High  . Hyperlipidemia 02/04/2008    Priority: Medium  . Essential hypertension 02/04/2008    Priority: Medium  . Health care maintenance 02/18/2015    Priority: Low  . Erectile dysfunction 02/18/2015    Priority: Low  . Heel pain 03/22/2011    Priority: Low  . GERD (gastroesophageal reflux disease) 03/22/2011    Priority: Low  . Osteoarthritis of right knee 04/18/2010    Priority: Low  . BPH associated with nocturia 04/10/2015    Medications- reviewed  Current Outpatient Prescriptions  Medication Sig Dispense Refill  . amLODipine (NORVASC) 5 MG tablet TAKE 1 TABLET (5 MG TOTAL) BY MOUTH DAILY. 90 tablet 3  . aspirin 325 MG tablet Take 325 mg by mouth daily.      Marland Kitchen lisinopril (PRINIVIL,ZESTRIL) 20 MG tablet TAKE 1 TABLET (20 MG TOTAL) BY MOUTH DAILY. 90 tablet 2  . rosuvastatin (CRESTOR) 20 MG tablet Take 1 tablet (20 mg total) by mouth every other day. 90 tablet 3  . acetaminophen (TYLENOL) 325 MG tablet Take 2 tablets (650 mg total) by mouth every 6 (six) hours as needed. (Patient not taking: Reported on 01/23/2016) 20 tablet 0   Objective: BP 140/74 mmHg  Pulse 94  Temp(Src) 97.9 F (36.6 C)  Wt 179 lb (81.194 kg) Gen:  NAD, resting comfortably HEENT: Turbinates erythematous with yellow drainage, TM normal, pharynx mildly erythematous with no tonsilar exudate or edema, minimal sinus tenderness CV: RRR no murmurs rubs or gallops Lungs: CTAB no crackles, wheeze, rhonchi Abdomen: soft/nontender/nondistended/normal bowel sounds. No rebound or guarding.  Ext: no edema Skin: warm, dry, no rash Neuro: grossly normal, moves all extremities  Assessment/Plan:  Upper Respiratory Infection vs. Sinsusitis Viral based on <10 days, no double sickening, lack of severity of symptoms in first 3 days. We also discussed reasons why current illness does not meet criteria for bacterial illness and therefore no antibiotics indicated. Also educated on signs that bacterial infection may have developed.   No pneumonia on exam. No bronchitis by exam.   High risk patient with history of CAD, s/p atrial myxoma removal, hypertension, hyperlipidemia, age.   Treatment: -considered steroid: patient opted out -other symptomatic care with hydrocodone cough syrup (states has had no allergic symptoms with this) -Antibiotic indicated: no but patient will call me if he has fever, worsening sinus pressure, symptoms that get better then get worse, symptoms that last to next Monday specifically with sinus pressure and green drainage.  Finally, we reviewed reasons to return to care including if symptoms worsen or persist or new concerns arise.  Meds ordered this encounter  Medications  . HYDROcodone-homatropine (HYCODAN) 5-1.5 MG/5ML syrup    Sig: Take 2.5-5 mLs by mouth every 6 (six) hours as needed for cough.  Dispense:  180 mL    Refill:  0

## 2016-01-23 NOTE — Patient Instructions (Signed)
Upper Respiratory Infection vs. Sinsusitis Viral based on <10 days, no double sickening, lack of severity of symptoms in first 3 days. We also discussed reasons why current illness does not meet criteria for bacterial illness and therefore no antibiotics indicated. Also educated on signs that bacterial infection may have developed.   No pneumonia on exam. No bronchitis by exam.   Treatment: -considered steroid: patient opted out -other symptomatic care with hydrocodone cough syrup (states has had no allergic symptoms with this) -Antibiotic indicated: no but patient will call me if he has fever, worsening sinus pressure, symptoms that get better then get worse, symptoms that last to next Monday specifically with sinus pressure and green drainage.  Finally, we reviewed reasons to return to care including if symptoms worsen or persist or new concerns arise.  Meds ordered this encounter  Medications  . HYDROcodone-homatropine (HYCODAN) 5-1.5 MG/5ML syrup    Sig: Take 2.5-5 mLs by mouth every 6 (six) hours as needed for cough.    Dispense:  180 mL    Refill:  0

## 2016-04-15 ENCOUNTER — Telehealth: Payer: Self-pay

## 2016-04-15 NOTE — Telephone Encounter (Signed)
folllow up appt has been scheduled.

## 2016-04-15 NOTE — Telephone Encounter (Signed)
-----   Message from Marin Olp, MD sent at 04/15/2016  8:12 AM EDT ----- Patient needs to schedule 6 month follow up from LAST May. We definitely need to repeat his PSA sometime soon.   ----- Message -----    From: SYSTEM    Sent: 04/15/2016  12:04 AM      To: Marin Olp, MD

## 2016-04-22 ENCOUNTER — Other Ambulatory Visit: Payer: Self-pay | Admitting: Family Medicine

## 2016-04-22 ENCOUNTER — Ambulatory Visit (INDEPENDENT_AMBULATORY_CARE_PROVIDER_SITE_OTHER): Payer: Medicare Other | Admitting: Family Medicine

## 2016-04-22 ENCOUNTER — Encounter: Payer: Self-pay | Admitting: Family Medicine

## 2016-04-22 VITALS — BP 138/78 | HR 71 | Temp 97.8°F | Ht 70.0 in | Wt 175.0 lb

## 2016-04-22 DIAGNOSIS — N401 Enlarged prostate with lower urinary tract symptoms: Secondary | ICD-10-CM | POA: Diagnosis not present

## 2016-04-22 DIAGNOSIS — R351 Nocturia: Secondary | ICD-10-CM | POA: Diagnosis not present

## 2016-04-22 DIAGNOSIS — I2510 Atherosclerotic heart disease of native coronary artery without angina pectoris: Secondary | ICD-10-CM

## 2016-04-22 DIAGNOSIS — I10 Essential (primary) hypertension: Secondary | ICD-10-CM

## 2016-04-22 DIAGNOSIS — Z7289 Other problems related to lifestyle: Secondary | ICD-10-CM

## 2016-04-22 DIAGNOSIS — E785 Hyperlipidemia, unspecified: Secondary | ICD-10-CM

## 2016-04-22 LAB — LIPID PANEL
CHOL/HDL RATIO: 5
Cholesterol: 227 mg/dL — ABNORMAL HIGH (ref 0–200)
HDL: 45.1 mg/dL (ref >39.00)
LDL Cholesterol: 158 mg/dL — ABNORMAL HIGH (ref 0–99)
NonHDL: 181.58
TRIGLYCERIDES: 117 mg/dL (ref 0.0–149.0)
VLDL: 23.4 mg/dL (ref 0.0–40.0)

## 2016-04-22 LAB — COMPREHENSIVE METABOLIC PANEL
ALBUMIN: 4.6 g/dL (ref 3.5–5.2)
ALT: 20 U/L (ref 0–53)
AST: 21 U/L (ref 0–37)
Alkaline Phosphatase: 40 U/L (ref 39–117)
BILIRUBIN TOTAL: 0.6 mg/dL (ref 0.2–1.2)
BUN: 12 mg/dL (ref 6–23)
CALCIUM: 9.9 mg/dL (ref 8.4–10.5)
CO2: 29 mEq/L (ref 19–32)
Chloride: 101 mEq/L (ref 96–112)
Creatinine, Ser: 0.98 mg/dL (ref 0.40–1.50)
GFR: 80.96 mL/min (ref >60.00)
Glucose, Bld: 93 mg/dL (ref 70–99)
Potassium: 4.1 mEq/L (ref 3.5–5.1)
Sodium: 138 mEq/L (ref 135–145)
Total Protein: 7.2 g/dL (ref 6.0–8.3)

## 2016-04-22 LAB — CBC
HCT: 43.8 % (ref 39.0–52.0)
Hemoglobin: 14.9 g/dL (ref 13.0–17.0)
MCHC: 34.1 g/dL (ref 30.0–36.0)
MCV: 89.5 fl (ref 78.0–100.0)
PLATELETS: 247 10*3/uL (ref 150.0–400.0)
RBC: 4.9 Mil/uL (ref 4.22–5.81)
RDW: 12.6 % (ref 11.5–15.5)
WBC: 6.8 10*3/uL (ref 4.0–10.5)

## 2016-04-22 LAB — PSA: PSA: 2.57 ng/mL (ref 0.10–4.00)

## 2016-04-22 NOTE — Assessment & Plan Note (Signed)
S: symptoms stable with nocturia twice a night and no other changes in urination pattern. Plan was for repeat PSA in 6 months but did not return until a year.  Lab Results  Component Value Date   PSA 3.08 04/10/2015   PSA 1.49 10/07/2011   PSA 2.41 01/14/2011  A/P: size or prostate not very impressive but there is a possible ridge/nodule on left side. We discussed if PSA over 2.5 likely refer to urology for their opinion.

## 2016-04-22 NOTE — Progress Notes (Signed)
Subjective:  KIRIL CARPIO is a 70 y.o. year old very pleasant male patient who presents for/with See problem oriented charting ROS- No chest pain or shortness of breath. No headache or blurry vision. All of this despite being very active- states 68 year old that works with him cannot keep up.see any ROS included in HPI as well.   Past Medical History-  Patient Active Problem List   Diagnosis Date Noted  . CAD (coronary artery disease) 02/17/2015    Priority: High  . s/p excision of Atrial myxoma 02/04/2008    Priority: High  . BPH associated with nocturia 04/10/2015    Priority: Medium  . Hyperlipidemia 02/04/2008    Priority: Medium  . Essential hypertension 02/04/2008    Priority: Medium  . Health care maintenance 02/18/2015    Priority: Low  . Erectile dysfunction 02/18/2015    Priority: Low  . Heel pain 03/22/2011    Priority: Low  . GERD (gastroesophageal reflux disease) 03/22/2011    Priority: Low  . Osteoarthritis of right knee 04/18/2010    Priority: Low    Medications- reviewed and updated Current Outpatient Prescriptions  Medication Sig Dispense Refill  . amLODipine (NORVASC) 5 MG tablet TAKE 1 TABLET (5 MG TOTAL) BY MOUTH DAILY. 90 tablet 3  . aspirin 325 MG tablet Take 325 mg by mouth daily.      Marland Kitchen lisinopril (PRINIVIL,ZESTRIL) 20 MG tablet TAKE 1 TABLET (20 MG TOTAL) BY MOUTH DAILY. 90 tablet 2  . rosuvastatin (CRESTOR) 20 MG tablet Take 1 tablet (20 mg total) by mouth every other day. 90 tablet 3   No current facility-administered medications for this visit.    Objective: BP 138/78 mmHg  Pulse 71  Temp(Src) 97.8 F (36.6 C) (Oral)  Ht 5\' 10"  (1.778 m)  Wt 175 lb (79.379 kg)  BMI 25.11 kg/m2  SpO2 97% Gen: NAD, resting comfortably CV: RRR no murmurs rubs or gallops Lungs: CTAB no crackles, wheeze, rhonchi Abdomen: soft/nontender/nondistended/normal bowel sounds.   Ext: no edema Rectal: normal tone, mildly enlarged prostate with slight ridge on  left (? Nodule) but no tenderness Skin: warm, dry, no rash Neuro: grossly normal, moves all extremities  Assessment/Plan:  CAD (coronary artery disease) Hyperlipidemia S: history of CAD with 2 vessel bypass- done primarily done to already having open heart surgery for atrial myxoma. He was released by cardiology. He is asymptomatic. Compliant with crestor . Plan was to increase to 3-4x a week last yea (every other day from twice a week. Compliant with aspirin. BP contorlled on lisinopril and amlodipine- has not tolerated metoprolol in past.  Lab Results  Component Value Date   CHOL 204* 04/10/2015   HDL 47.40 04/10/2015   LDLCALC 131* 04/10/2015   LDLDIRECT 203.9 10/07/2011   TRIG 128.0 04/10/2015   CHOLHDL 4 04/10/2015  A/P: update ldl- hopefully under 100. asymptomatic- continue aspirin and statin though may need to adjust dose  Essential hypertension S: controlled. On lisinopril 20mg  and amlodipine 5mg   BP Readings from Last 3 Encounters:  04/22/16 138/78  01/23/16 140/74  04/10/15 124/82  A/P:Continue current meds:  Just below goal fortunately.   BPH associated with nocturia S: symptoms stable with nocturia twice a night and no other changes in urination pattern. Plan was for repeat PSA in 6 months but did not return until a year.  Lab Results  Component Value Date   PSA 3.08 04/10/2015   PSA 1.49 10/07/2011   PSA 2.41 01/14/2011  A/P: size or  prostate not very impressive but there is a possible ridge/nodule on left side. We discussed if PSA over 2.5 likely refer to urology for their opinion.    Return in about 6 months (around 10/23/2016) for physical. You agreed to pneumonia shot and colonoscopy at that time. .  Declines prevnar and colonoscopy- we discussed planning for these for physical  Return precautions advised.   Orders Placed This Encounter  Procedures  . CBC    [place]  . Comprehensive metabolic panel    [place]    Order Specific Question:  Has the  patient fasted?    Answer:  No  . Lipid panel    [place]    Order Specific Question:  Has the patient fasted?    Answer:  No  . PSA  . Hepatitis C antibody, reflex    [facility]   Garret Reddish, MD

## 2016-04-22 NOTE — Assessment & Plan Note (Signed)
S: controlled. On lisinopril 20mg  and amlodipine 5mg   BP Readings from Last 3 Encounters:  04/22/16 138/78  01/23/16 140/74  04/10/15 124/82  A/P:Continue current meds:  Just below goal fortunately.

## 2016-04-22 NOTE — Patient Instructions (Addendum)
Fasting labs before you leave  No change in medication for now- may be based on labs (cholesterol potentially)  I am concerned about your PSA trend- update today and refer to urology I would say if not under 2.5 for their opinion- possible nodule on left side but would want their expertise

## 2016-04-22 NOTE — Assessment & Plan Note (Signed)
Hyperlipidemia S: history of CAD with 2 vessel bypass- done primarily done to already having open heart surgery for atrial myxoma. He was released by cardiology. He is asymptomatic. Compliant with crestor . Plan was to increase to 3-4x a week last yea (every other day from twice a week. Compliant with aspirin. BP contorlled on lisinopril and amlodipine- has not tolerated metoprolol in past.  Lab Results  Component Value Date   CHOL 204* 04/10/2015   HDL 47.40 04/10/2015   LDLCALC 131* 04/10/2015   LDLDIRECT 203.9 10/07/2011   TRIG 128.0 04/10/2015   CHOLHDL 4 04/10/2015  A/P: update ldl- hopefully under 100. asymptomatic- continue aspirin and statin though may need to adjust dose

## 2016-04-23 ENCOUNTER — Other Ambulatory Visit: Payer: Self-pay | Admitting: Pediatrics

## 2016-04-23 LAB — HEPATITIS C ANTIBODY: HCV Ab: NEGATIVE

## 2016-06-13 ENCOUNTER — Other Ambulatory Visit: Payer: Self-pay | Admitting: Family Medicine

## 2016-07-13 ENCOUNTER — Other Ambulatory Visit: Payer: Self-pay | Admitting: Family Medicine

## 2016-09-17 ENCOUNTER — Other Ambulatory Visit: Payer: Self-pay | Admitting: Family Medicine

## 2016-09-25 ENCOUNTER — Other Ambulatory Visit: Payer: Self-pay | Admitting: Family Medicine

## 2017-02-20 DIAGNOSIS — S0502XA Injury of conjunctiva and corneal abrasion without foreign body, left eye, initial encounter: Secondary | ICD-10-CM | POA: Diagnosis not present

## 2017-06-12 ENCOUNTER — Ambulatory Visit (INDEPENDENT_AMBULATORY_CARE_PROVIDER_SITE_OTHER): Payer: Medicare Other | Admitting: Family Medicine

## 2017-06-12 ENCOUNTER — Encounter: Payer: Self-pay | Admitting: Family Medicine

## 2017-06-12 VITALS — BP 160/92 | HR 87 | Temp 98.1°F | Ht 70.0 in | Wt 174.8 lb

## 2017-06-12 DIAGNOSIS — H6123 Impacted cerumen, bilateral: Secondary | ICD-10-CM

## 2017-06-12 DIAGNOSIS — I10 Essential (primary) hypertension: Secondary | ICD-10-CM

## 2017-06-12 MED ORDER — AMLODIPINE BESYLATE 5 MG PO TABS: ORAL_TABLET | ORAL | 3 refills | Status: DC

## 2017-06-12 MED ORDER — ROSUVASTATIN CALCIUM 20 MG PO TABS: 20.0000 mg | ORAL_TABLET | Freq: Every day | ORAL | 3 refills | Status: DC

## 2017-06-12 NOTE — Patient Instructions (Signed)
Schedule physical within 1-2 months. Come fasting to next visit except take medications with water  Pick up amlodipine and restart taking. Very important to not run out of this as blood pressure being high can cause worsening heart issues.   Take lisinopril as soon as you get home as well

## 2017-06-12 NOTE — Progress Notes (Signed)
Subjective:  Ethan Payne is a 71 y.o. year old very pleasant male patient who presents for/with See problem oriented charting ROS- reports hearing loss. No chest pain or shortness of breath. No headache or blurry vision.    Past Medical History-  Patient Active Problem List   Diagnosis Date Noted  . CAD (coronary artery disease) 02/17/2015    Priority: High  . s/p excision of Atrial myxoma 02/04/2008    Priority: High  . BPH associated with nocturia 04/10/2015    Priority: Medium  . Hyperlipidemia 02/04/2008    Priority: Medium  . Essential hypertension 02/04/2008    Priority: Medium  . Health care maintenance 02/18/2015    Priority: Low  . Erectile dysfunction 02/18/2015    Priority: Low  . Heel pain 03/22/2011    Priority: Low  . GERD (gastroesophageal reflux disease) 03/22/2011    Priority: Low  . Osteoarthritis of right knee 04/18/2010    Priority: Low    Medications- reviewed and updated Current Outpatient Prescriptions  Medication Sig Dispense Refill  . aspirin 325 MG tablet Take 325 mg by mouth daily.      Marland Kitchen lisinopril (PRINIVIL,ZESTRIL) 20 MG tablet TAKE 1 TABLET (20 MG TOTAL) BY MOUTH DAILY. 90 tablet 2  . rosuvastatin (CRESTOR) 20 MG tablet Take 20 mg by mouth daily. #90 disensed     No current facility-administered medications for this visit.     Objective: BP (!) 160/92 (BP Location: Left Arm, Patient Position: Sitting, Cuff Size: Large)   Pulse 87   Temp 98.1 F (36.7 C) (Oral)   Ht 5\' 10"  (1.778 m)   Wt 174 lb 12.8 oz (79.3 kg)   SpO2 95%   BMI 25.08 kg/m  Gen: NAD, resting comfortably Complete obstruction of bilateral TM. Irrigated with saline with full view of TM- some remaining wax was curetted out CV: RRR no murmurs rubs or gallops Lungs: CTAB no crackles, wheeze, rhonchi Ext: no edema Skin: warm, dry  Assessment/Plan:  Hearing loss due to cerumen impaction, bilateral  Essential hypertension S: Both ears have been clogged for at least  2 weeks and over last 2 days has lost his hearing in both ears- worse than his baseline hearing loss. Happens every few years at least A/P: Irrigation completed with success today on both ears. Full restoration of baseline hearing.   Essential hypertension S: controlled on poorly. He has not taken his lisinopril 20mg  today. States he ran out of amlodipine 5mg  months ago ASCVD 10 year risk calculation if age 97-79: on statin BP Readings from Last 3 Encounters:  06/12/17 (!) 160/92  04/22/16 138/78  01/23/16 140/74  A/P: very poor control- We discussed blood pressure goal of <140/90. Far outside of goal today- start amlodipine again and encouraged him to take lisinopril as soon as he gets home. Asked him to schedule CPE within 1-2 months so we can recheck blood pressure back on  meds and follow up chronic conditions- noted after visit he has not scheduled so will have Malachy Mood reach out to patient.   1-2 month CPE  Meds ordered this encounter  Medications  . amLODipine (NORVASC) 5 MG tablet    Sig: TAKE 1 TABLET (5 MG TOTAL) BY MOUTH DAILY.    Dispense:  90 tablet    Refill:  3  . rosuvastatin (CRESTOR) 20 MG tablet    Sig: Take 1 tablet (20 mg total) by mouth daily.    Dispense:  90 tablet    Refill:  3    Return precautions advised.  Garret Reddish, MD

## 2017-06-12 NOTE — Assessment & Plan Note (Addendum)
S: controlled on poorly. He has not taken his lisinopril 20mg  today. States he ran out of amlodipine 5mg  months ago ASCVD 10 year risk calculation if age 69-79: on statin BP Readings from Last 3 Encounters:  06/12/17 (!) 160/92  04/22/16 138/78  01/23/16 140/74  A/P: very poor control- We discussed blood pressure goal of <140/90. Far outside of goal today- start amlodipine again and encouraged him to take lisinopril as soon as he gets home. Asked him to schedule CPE within 1-2 months so we can recheck blood pressure back on  meds and follow up chronic conditions- noted after visit he has not scheduled so will have Malachy Mood reach out to patient.

## 2017-06-22 ENCOUNTER — Other Ambulatory Visit: Payer: Self-pay | Admitting: Family Medicine

## 2017-08-21 ENCOUNTER — Ambulatory Visit (INDEPENDENT_AMBULATORY_CARE_PROVIDER_SITE_OTHER): Payer: Medicare Other | Admitting: Family Medicine

## 2017-08-21 ENCOUNTER — Encounter: Payer: Self-pay | Admitting: Family Medicine

## 2017-08-21 VITALS — BP 146/84 | HR 82 | Temp 97.9°F | Ht 66.0 in | Wt 173.6 lb

## 2017-08-21 DIAGNOSIS — I10 Essential (primary) hypertension: Secondary | ICD-10-CM | POA: Diagnosis not present

## 2017-08-21 DIAGNOSIS — Z0000 Encounter for general adult medical examination without abnormal findings: Secondary | ICD-10-CM | POA: Diagnosis not present

## 2017-08-21 DIAGNOSIS — N401 Enlarged prostate with lower urinary tract symptoms: Secondary | ICD-10-CM | POA: Diagnosis not present

## 2017-08-21 DIAGNOSIS — E785 Hyperlipidemia, unspecified: Secondary | ICD-10-CM

## 2017-08-21 DIAGNOSIS — R351 Nocturia: Secondary | ICD-10-CM | POA: Diagnosis not present

## 2017-08-21 DIAGNOSIS — D151 Benign neoplasm of heart: Secondary | ICD-10-CM

## 2017-08-21 DIAGNOSIS — I2510 Atherosclerotic heart disease of native coronary artery without angina pectoris: Secondary | ICD-10-CM | POA: Diagnosis not present

## 2017-08-21 LAB — COMPREHENSIVE METABOLIC PANEL
ALK PHOS: 38 U/L — AB (ref 39–117)
ALT: 24 U/L (ref 0–53)
AST: 23 U/L (ref 0–37)
Albumin: 4.6 g/dL (ref 3.5–5.2)
BILIRUBIN TOTAL: 0.8 mg/dL (ref 0.2–1.2)
BUN: 12 mg/dL (ref 6–23)
CO2: 32 mEq/L (ref 19–32)
Calcium: 10.2 mg/dL (ref 8.4–10.5)
Chloride: 100 mEq/L (ref 96–112)
Creatinine, Ser: 1.08 mg/dL (ref 0.40–1.50)
GFR: 72.09 mL/min (ref >60.00)
Glucose, Bld: 103 mg/dL — ABNORMAL HIGH (ref 70–99)
Potassium: 4.4 mEq/L (ref 3.5–5.1)
Sodium: 138 mEq/L (ref 135–145)
TOTAL PROTEIN: 7.5 g/dL (ref 6.0–8.3)

## 2017-08-21 LAB — CBC
HCT: 45.4 % (ref 39.0–52.0)
HEMOGLOBIN: 15.4 g/dL (ref 13.0–17.0)
MCHC: 34 g/dL (ref 30.0–36.0)
MCV: 91.1 fl (ref 78.0–100.0)
Platelets: 244 10*3/uL (ref 150.0–400.0)
RBC: 4.98 Mil/uL (ref 4.22–5.81)
RDW: 13.1 % (ref 11.5–15.5)
WBC: 7.1 10*3/uL (ref 4.0–10.5)

## 2017-08-21 LAB — LIPID PANEL
Cholesterol: 186 mg/dL (ref 0–200)
HDL: 60.4 mg/dL (ref >39.00)
LDL Cholesterol: 104 mg/dL — ABNORMAL HIGH (ref 0–99)
NonHDL: 125.51
TRIGLYCERIDES: 107 mg/dL (ref 0.0–149.0)
Total CHOL/HDL Ratio: 3
VLDL: 21.4 mg/dL (ref 0.0–40.0)

## 2017-08-21 LAB — PSA: PSA: 3.89 ng/mL (ref 0.10–4.00)

## 2017-08-21 NOTE — Assessment & Plan Note (Signed)
S: poorly controlled on last check. He is now on crestor 20mg  and without  myalgias.  Lab Results  Component Value Date   CHOL 227 (H) 04/22/2016   HDL 45.10 04/22/2016   LDLCALC 158 (H) 04/22/2016   LDLDIRECT 203.9 10/07/2011   TRIG 117.0 04/22/2016   CHOLHDL 5 04/22/2016   A/P: update lipids today- hopeful for LDL at least <70. May have to increase to max dose statin if not

## 2017-08-21 NOTE — Patient Instructions (Addendum)
Roselyn Reef will get you set up for cologuard. This stool test is good for 3 years.   I want to check in 6 months from now- afternoon appointment to check blood pressure. Bring your home cuff with you as well.   Glad you are doing so well. Make sure to see dentist every 6 months and eye doctor yearly.   Please stop by lab before you go

## 2017-08-21 NOTE — Progress Notes (Signed)
Phone: [phone]  Subjective:  Patient presents today for their annual physical. Chief complaint-noted.   See problem oriented charting- ROS- full  review of systems was completed and negative including No chest pain or shortness of breath. No headache or blurry vision.    The following were reviewed and entered/updated in epic: Past Medical History:  Diagnosis Date  . Acute prostatitis 02/04/2008  . ATRIAL MYXOMA 02/04/2008  . Closed fracture of four ribs 11/01/2009  . HYPERLIPIDEMIA 02/04/2008  . HYPERTENSION 02/04/2008  . Osteoarthritis of right knee 04/18/2010   Patient Active Problem List   Diagnosis Date Noted  . CAD (coronary artery disease) 02/17/2015    Priority: High  . s/p excision of Atrial myxoma 02/04/2008    Priority: High  . BPH associated with nocturia 04/10/2015    Priority: Medium  . Hyperlipidemia 02/04/2008    Priority: Medium  . Essential hypertension 02/04/2008    Priority: Medium  . Health care maintenance 02/18/2015    Priority: Low  . Erectile dysfunction 02/18/2015    Priority: Low  . Heel pain 03/22/2011    Priority: Low  . GERD (gastroesophageal reflux disease) 03/22/2011    Priority: Low  . Osteoarthritis of right knee 04/18/2010    Priority: Low   Past Surgical History:  Procedure Laterality Date  . cardio version for a fibi    . CORONARY ARTERY BYPASS GRAFT     2 vessel during atrial myxoma procedure  . removal of atrial myxoma      Family History  Problem Relation Age of Onset  . Stroke Mother   . Heart disease Father        MI 87  . Hypertension Father   . Ovarian cancer Sister     Medications- reviewed and updated Current Outpatient Prescriptions  Medication Sig Dispense Refill  . amLODipine (NORVASC) 5 MG tablet TAKE 1 TABLET (5 MG TOTAL) BY MOUTH DAILY. 90 tablet 3  . aspirin 325 MG tablet Take 325 mg by mouth daily.      Marland Kitchen lisinopril (PRINIVIL,ZESTRIL) 20 MG tablet TAKE 1 TABLET (20 MG TOTAL) BY MOUTH DAILY. 90 tablet 2    . rosuvastatin (CRESTOR) 20 MG tablet Take 1 tablet (20 mg total) by mouth daily. 90 tablet 3   No current facility-administered medications for this visit.     Allergies-reviewed and updated Allergies  Allergen Reactions  . Claritin [Loratadine] Other (See Comments)    Heart speeds up  . Hydrocodone     After rib fractures. Hydrocodone-States had shortness of breath, 911 was called    Social History   Social History  . Marital status: Married    Spouse name: N/A  . Number of children: N/A  . Years of education: N/A   Occupational History  . married    Social History Main Topics  . Smoking status: Never Smoker  . Smokeless tobacco: Never Used  . Alcohol use No  . Drug use: No  . Sexual activity: Yes   Other Topics Concern  . None   Social History Narrative   Married 47 years in 2016 (wife seen at another practice). 2 children-daughter unmarried, professional student 46 and son 16 in 2016. 2 grandsons-12 and 6 in 2016.       Still works as Clinical biochemist, does his own carpentry work      Hobbies: fishing    Objective: BP (!) 146/84   Pulse 82   Temp 97.9 F (36.6 C) (Oral)   Ht 5'  6" (1.676 m)   Wt 173 lb 9.6 oz (78.7 kg)   SpO2 95%   BMI 28.02 kg/m  Gen: NAD, resting comfortably HEENT: Mucous membranes are moist. Oropharynx normal Neck: no thyromegaly CV: RRR no murmurs rubs or gallops Lungs: CTAB no crackles, wheeze, rhonchi Abdomen: soft/nontender/nondistended/normal bowel sounds. No rebound or guarding.  Ext: no edema Skin: warm, dry Neuro: grossly normal, moves all extremities, PERRLA Rectal: normal tone, diffusely enlarged prostate, no masses or tenderness  EKG - sinus rhytm with rate 69, normal axis, normal intervals, no q waves, no st or t wave changes. RSR in V1 unchanged from 01/18/10 as is rest of EKG.   Assessment/Plan:  71 y.o. male presenting for annual physical.  Health Maintenance counseling: 1. Anticipatory guidance: Patient  counseled regarding regular dental exams , eye exams , wearing seatbelts.  2. Risk factor reduction:  Advised patient of need for regular exercise and diet rich and fruits and vegetables to reduce risk of heart attack and stroke. Exercise- has exercise room at his house- does weight lifting, flys, overhead press, push ups- does 30 of each - each day. Stopped walking due to high level yardwork. Diet-reasonable, weight stable.  Wt Readings from Last 3 Encounters:  08/21/17 173 lb 9.6 oz (78.7 kg)  06/12/17 174 lb 12.8 oz (79.3 kg)  04/22/16 175 lb (79.4 kg)  3. Immunizations/screenings/ancillary studies- declined flu shot. Declines prevnar 13.  Immunization History  Administered Date(s) Administered  . Influenza Whole 09/01/2009  . Influenza,inj,Quad PF,6+ Mos 01/14/2014  . Pneumococcal Polysaccharide-23 10/03/2009  . Td 05/05/2009  . Tdap 08/01/2014    4. Prostate cancer screening- update PSA trend- #s have been around 3- has BPH on exam and nocturia 2 a night- stable  Component Value Date   PSA 2.57 04/22/2016   PSA 3.08 04/10/2015   PSA 1.49 10/07/2011   5. Colon cancer screening -  has requested stool cards in past. Strongly advised colonoscopy . States  He has had 2 and bad stomach upset. Did opt in for cologuard 6. Skin cancer screening-  advised regular sunscreen use. Denies worrisome, changing, or new skin lesions.   Status of chronic or acute concerns   CAD- history of 2 vessel bypass. This was done mainly due to already having open heart surgery for atrial myxoma. Has a fib before myxoma removed but none since so will not treat with long term anticoagulants. Updated ekg today  Hyperlipidemia S: poorly controlled on last check. He is now on crestor 20mg  and without  myalgias.  Lab Results  Component Value Date   CHOL 227 (H) 04/22/2016   HDL 45.10 04/22/2016   LDLCALC 158 (H) 04/22/2016   LDLDIRECT 203.9 10/07/2011   TRIG 117.0 04/22/2016   CHOLHDL 5 04/22/2016   A/P:  update lipids today- hopeful for LDL at least <70. May have to increase to max dose statin if not  Essential hypertension  S: controlled poorly initially on  amlodipine 5mg  (yesterday afternoon) and lisinopril 20mg  (this AM) . Last visit bp high but had not taken lisinopril and had run out of amlodipine.  BP Readings from Last 3 Encounters:  08/21/17 (!) 160/84  06/12/17 (!) 160/92  04/22/16 138/78  A/P: We discussed blood pressure goal of <140/90. Continue current meds:  Home readings look great in 130s over 80 range- no changes planned. Discussed bring cuff to next visit so we could verify home cuff  6 months  Orders Placed This Encounter  Procedures  . CBC  [place]  . Comprehensive metabolic panel    [place]    Order Specific Question:   Has the patient fasted?    Answer:   No  . Lipid panel    [place]    Order Specific Question:   Has the patient fasted?    Answer:   No  . PSA  . EKG 12-Lead    Order Specific Question:   Where should this test be performed    Answer:   Other   Return precautions advised.  Garret Reddish, MD

## 2017-08-21 NOTE — Assessment & Plan Note (Signed)
  S: controlled poorly initially on  amlodipine 5mg  (yesterday afternoon) and lisinopril 20mg  (this AM) . Last visit bp high but had not taken lisinopril and had run out of amlodipine.  BP Readings from Last 3 Encounters:  08/21/17 (!) 160/84  06/12/17 (!) 160/92  04/22/16 138/78  A/P: We discussed blood pressure goal of <140/90. Continue current meds:  Home readings look great in 130s over 80 range- no changes planned. Discussed bring cuff to next visit so we could verify home cuff

## 2017-08-28 ENCOUNTER — Other Ambulatory Visit: Payer: Self-pay

## 2017-08-28 DIAGNOSIS — R972 Elevated prostate specific antigen [PSA]: Secondary | ICD-10-CM

## 2017-09-08 DIAGNOSIS — Z1211 Encounter for screening for malignant neoplasm of colon: Secondary | ICD-10-CM | POA: Diagnosis not present

## 2017-09-08 DIAGNOSIS — Z1212 Encounter for screening for malignant neoplasm of rectum: Secondary | ICD-10-CM | POA: Diagnosis not present

## 2017-09-09 LAB — COLOGUARD: Cologuard: NEGATIVE

## 2017-09-18 ENCOUNTER — Encounter: Payer: Self-pay | Admitting: Family Medicine

## 2017-10-09 ENCOUNTER — Other Ambulatory Visit (INDEPENDENT_AMBULATORY_CARE_PROVIDER_SITE_OTHER): Payer: Medicare Other

## 2017-10-09 DIAGNOSIS — R972 Elevated prostate specific antigen [PSA]: Secondary | ICD-10-CM

## 2017-10-09 LAB — PSA: PSA: 4.08 ng/mL — AB (ref 0.10–4.00)

## 2017-10-10 ENCOUNTER — Other Ambulatory Visit: Payer: Self-pay

## 2017-10-10 DIAGNOSIS — R972 Elevated prostate specific antigen [PSA]: Secondary | ICD-10-CM

## 2017-11-26 ENCOUNTER — Ambulatory Visit: Payer: Self-pay

## 2017-11-26 ENCOUNTER — Ambulatory Visit: Payer: Medicare Other | Admitting: Sports Medicine

## 2017-11-26 ENCOUNTER — Ambulatory Visit (INDEPENDENT_AMBULATORY_CARE_PROVIDER_SITE_OTHER): Payer: Medicare Other

## 2017-11-26 ENCOUNTER — Encounter: Payer: Self-pay | Admitting: Sports Medicine

## 2017-11-26 ENCOUNTER — Ambulatory Visit: Payer: Self-pay | Admitting: *Deleted

## 2017-11-26 VITALS — BP 190/94 | HR 120 | Ht 66.0 in | Wt 178.8 lb

## 2017-11-26 DIAGNOSIS — G2589 Other specified extrapyramidal and movement disorders: Secondary | ICD-10-CM | POA: Diagnosis not present

## 2017-11-26 DIAGNOSIS — M75101 Unspecified rotator cuff tear or rupture of right shoulder, not specified as traumatic: Secondary | ICD-10-CM | POA: Diagnosis not present

## 2017-11-26 DIAGNOSIS — M25511 Pain in right shoulder: Secondary | ICD-10-CM | POA: Diagnosis not present

## 2017-11-26 NOTE — Progress Notes (Signed)
Juanda Bond. [place], [place] [facility]  RONI SCOW - 71 y.o. male MRN [id number]  Date of birth: 07-07-1946  Visit Date: 11/26/2017  PCP: Marin Olp, MD   Referred by: Marin Olp, MD    SUBJECTIVE:  Barbaraann Boys is here for Shoulder Pain (right) .  Referred by: Garret Reddish, MD His right shoulder symptoms INITIALLY: Began 3 months ago insidiously. Described as moderate pain and aching. nonradiating Worsened with overhead motion and internal rotation Improved with rest Additional associated symptoms include: Denies any clicking or popping.  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    At this time symptoms are worsening compared to onset.  Now keeping him awake at night. He has been heating and taking Tylenol.    ROS Reports night time disturbances.  Intermittent, Denies fevers, chills, or night sweats. Denies unexplained weight loss. Denies personal history of cancer. Denies changes in bowel or bladder habits. Denies recent unreported falls. Denies new or worsening dyspnea or wheezing. Denies headaches or dizziness.  Reports numbness, tingling or weakness  In the extremities.  Weakness with lifting Denies dizziness or presyncopal episodes Denies lower extremity edema     HISTORY & PERTINENT PRIOR DATA:  Prior History reviewed and updated per electronic medical record.  Significant history, findings, studies and interim changes include:  reports that  has never smoked. he has never used smokeless tobacco. No results for input(s): HGBA1C, LABURIC, CREATINE in the last 8760 hours. No specialty comments available. Problem  Rotator Cuff Syndrome of Right Shoulder   MSK ultrasound on 11/26/2017 shows chronic tearing of the supraspinatus, and subscap     OBJECTIVE:  VS:  HT:5\' 6"  (167.6 cm)   WT:178 lb 12.8 oz (81.1 kg)  BMI:28.87    BP:(!) 190/94(has not taken meds  today)  HR:(!) 120bpm  TEMP: ( )  RESP:96 %  PHYSICAL EXAM: Constitutional: WDWN, Non-toxic appearing. Psychiatric: Alert & appropriately interactive. Not depressed or anxious appearing. Respiratory: No increased work of breathing. Trachea Midline Eyes: Pupils are equal. EOM intact without nystagmus. No scleral icterus Cardiovascular:  Peripheral Pulses: peripheral pulses symmetrical No clubbing or cyanosis appreciated Capillary Refill is normal, less than 2 seconds No signficant generalized edema/anasarca Sensory Exam: intact to light touch  Right Shoulder Exam: Normal alignment & Contours Skin: No overlying erythema/ecchymosis Neck: normal range of motion and supple Axial loading and circumduction produces: No pain or crepitation  Non tender to palpation over: Bony Landmarks TTP over: globally, worse over the biceps tendon Drop arm test: negative Hawkins: positive, moderate pain  Neers: positive, moderate pain   Internal Rotation:  ROM: L2 with moderate pain Strength: 4/5 External Rotation:  ROM: Normal with moderate pain Strength: 4/5  Empty can: positive, mild pain Strength: 4/5 Speeds: positive, mild pain Strength: 4/5 O'Briens: positive, mild pain Strength: 4/5  No additional findings.   ASSESSMENT & PLAN:   1. Acute pain of right shoulder   2. Rotator cuff syndrome of right shoulder   3. Scapular dyskinesis    PLAN:    Rotator cuff syndrome of right shoulder Given scapular dyskinesis and impingement signs with underlying chronic tear we will go ahead and provide him a subacromial injection today and have him begin on scapular stabilization and intrinsic shoulder strengthening exercises per handout.  Discussed the option for referral to physical therapy and he will call us if he would like to pursue this.  If any lack of improvement formal therapy will be recommended.  >50% of this 40 minute visit spent in direct patient counseling and/or coordination of care.   Discussion was focused on education regarding the in discussing the pathoetiology and anticipated clinical course of the above condition.  ++++++++++++++++++++++++++++++++++++++++++++ PROCEDURE NOTE: Right subacromial injection  The patients clinical condition is marked by substantial pain and/or significant functional disability. Other conservative therapy has not provided relief, is contraindicated, or not appropriate. There is a reasonable likelihood that injection will significantly improve the patients pain and/or functional impairment.  After discussing the risks, benefits and expected outcomes of the injection and all questions were reviewed and answered, the patient wished to undergo the above named procedure.  Verbal consent was obtained. After an appropriate time out was taken the target structure prepped and aspirated and injected as below: PREP: Alcohol, Ethel Chloride APPROACH: Posterior, single injection, 22g 1.5 needle INJECTATE: 3 cc 1% lidocaine, 1 cc of 40 mg Depo-Medrol ASPIRATE: N/A DRESSING: Band-Aid Post procedural instructions including recommending icing and warning signs for infection were reviewed.  This procedure was well tolerated and there were no complications.     ++++++++++++++++++++++++++++++++++++++++++++ Orders & Meds: Orders Placed This Encounter  Procedures  . DG Shoulder Right  . [place] LIMITED JOINT SPACE STRUCTURES UP RIGHT(NO LINKED CHARGES)    No orders of the defined types were placed in this encounter.   ++++++++++++++++++++++++++++++++++++++++++++ Follow-up: Return in about 6 weeks (around 01/07/2018).   Pertinent documentation may be included in additional procedure notes, imaging studies, problem based documentation and patient instructions. Please see these sections of the encounter for additional information regarding this visit. CMA/ATC served as Education administrator during this visit. History, Physical, and Plan performed by medical provider.  Documentation and orders reviewed and attested to.      Gerda Diss, [facility] Physician

## 2017-11-26 NOTE — Telephone Encounter (Signed)
See triage note.

## 2017-11-26 NOTE — Telephone Encounter (Signed)
Pt states that he fell out of the back of his truck a month ago and hurt his right shoulder; pt states that he has been taking advil and it "makes it a little bit better"; spoke with [place] about this appointment; pt offered and accepted a 1400 appointment with Dr Paulla Fore; pt verbalizes understanding.  Reason for Disposition . Can't move injured shoulder normally (e.g., full range of motion, able to touch top of head)  Answer Assessment - Initial Assessment Questions 1. MECHANISM: "How did the injury happen?"     A month ago pt fell off his truck 2. ONSET: "When did the injury happen?" (Minutes or hours ago)      Weeks ago 3. APPEARANCE of INJURY: "What does the injury look like?"   unable to describe 4. SEVERITY: "Can you move the shoulder normally?"      Hurts when he moves it in certain positions or try to pick up something in a certain way  5. SIZE: For cuts, bruises, or swelling, ask: "How large is it?" (e.g., inches or centimeters;  entire joint)      none 6. PAIN: "Is there pain?" If so, ask: "How bad is the pain?"   (e.g., Scale 1-10; or mild, moderate, severe)     Rated 5-7 out of 10 7. TETANUS: For any breaks in the skin, ask: "When was the last tetanus booster?"     n/a 8. OTHER SYMPTOMS: "Do you have any other symptoms?" (e.g., loss of sensation)     Sharp pain when turning steering wheel  9. PREGNANCY: "Is there any chance you are pregnant?" "When was your last menstrual period?"    n/a  Protocols used: SHOULDER INJURY-A-AH

## 2017-12-04 DIAGNOSIS — R972 Elevated prostate specific antigen [PSA]: Secondary | ICD-10-CM | POA: Diagnosis not present

## 2017-12-08 DIAGNOSIS — M75101 Unspecified rotator cuff tear or rupture of right shoulder, not specified as traumatic: Secondary | ICD-10-CM | POA: Insufficient documentation

## 2017-12-08 NOTE — Procedures (Signed)
LIMITED MSK ULTRASOUND OF right shoulder Images were obtained and interpreted by myself, Teresa Coombs, DO  Images have been saved and stored to PACS system. Images obtained on: GE S7 Ultrasound machine  FINDINGS:  Biceps Tendon: Normal Pec Major Insertion: Normal Subscapularis Tendon: Diminutive, appears likely chronically torn Supraspinatus Tendon: Diminutive, appears likely chronically torn Infraspinatus/Teres Minor Tendon: Normal AC Joint: Mild degenerative spurring no significant mushroom sign JOINT: Moderate glenohumeral spurring  LABRUM: Not evaluated    IMPRESSION:  1. Likely chronic tears of the subscapularis and supraspinatus tendon

## 2017-12-08 NOTE — Assessment & Plan Note (Addendum)
Given scapular dyskinesis and impingement signs with underlying chronic tear we will go ahead and provide him a subacromial injection today and have him begin on scapular stabilization and intrinsic shoulder strengthening exercises per handout.  Discussed the option for referral to physical therapy and he will call us if he would like to pursue this.  If any lack of improvement formal therapy will be recommended.  >50% of this 40 minute visit spent in direct patient counseling and/or coordination of care.  Discussion was focused on education regarding the in discussing the pathoetiology and anticipated clinical course of the above condition.  ++++++++++++++++++++++++++++++++++++++++++++ PROCEDURE NOTE: Right subacromial injection  The patients clinical condition is marked by substantial pain and/or significant functional disability. Other conservative therapy has not provided relief, is contraindicated, or not appropriate. There is a reasonable likelihood that injection will significantly improve the patients pain and/or functional impairment.  After discussing the risks, benefits and expected outcomes of the injection and all questions were reviewed and answered, the patient wished to undergo the above named procedure.  Verbal consent was obtained. After an appropriate time out was taken the target structure prepped and aspirated and injected as below: PREP: Alcohol, Ethel Chloride APPROACH: Posterior, single injection, 22g 1.5 needle INJECTATE: 3 cc 1% lidocaine, 1 cc of 40 mg Depo-Medrol ASPIRATE: N/A DRESSING: Band-Aid Post procedural instructions including recommending icing and warning signs for infection were reviewed.  This procedure was well tolerated and there were no complications.

## 2017-12-09 ENCOUNTER — Encounter: Payer: Self-pay | Admitting: Sports Medicine

## 2018-01-07 ENCOUNTER — Ambulatory Visit: Payer: Medicare Other | Admitting: Sports Medicine

## 2018-01-07 ENCOUNTER — Encounter: Payer: Self-pay | Admitting: Sports Medicine

## 2018-01-07 VITALS — BP 122/80 | HR 99 | Ht 69.0 in | Wt 174.6 lb

## 2018-01-07 DIAGNOSIS — M75101 Unspecified rotator cuff tear or rupture of right shoulder, not specified as traumatic: Secondary | ICD-10-CM | POA: Diagnosis not present

## 2018-01-07 DIAGNOSIS — M2170 Unequal limb length (acquired), unspecified site: Secondary | ICD-10-CM

## 2018-01-07 MED ORDER — NITROGLYCERIN 0.2 MG/HR TD PT24: MEDICATED_PATCH | TRANSDERMAL | 1 refills | Status: DC

## 2018-01-07 NOTE — Patient Instructions (Addendum)
We gave you 3/8 of an inch heel lift from [email or web address] today.  If this seems to be too much you can consider 1/4 inch pad as this may be more appropriate.  Which ever size is more comfortable is what I recommend and I recommend placing this in every shoe you have.      Nitroglycerin Protocol   Apply 1/4 nitroglycerin patch to affected area daily.  Change position of patch within the affected area every 24 hours.  You may experience a headache during the first 1-2 weeks of using the patch, these should subside.  If you experience headaches after beginning nitroglycerin patch treatment, you may take your preferred over the counter pain reliever.  Another side effect of the nitroglycerin patch is skin irritation or rash related to patch adhesive.  Please notify our office if you develop more severe headaches or rash, and stop the patch.  Tendon healing with nitroglycerin patch may require 12 to 24 weeks depending on the extent of injury.  Men should not use if taking Viagra, Cialis, or Levitra.   Do not use if you have migraines or rosacea.

## 2018-01-07 NOTE — Progress Notes (Signed)
  Juanda Bond. [place], [facility] at [facility]  Ethan Payne - 72 y.o. male MRN [id number]  Date of birth: 1946/10/02  Visit Date: 01/07/2018  PCP: Marin Olp, MD   Referred by: Marin Olp, MD   Scribe for today's visit: Josepha Pigg, CMA     SUBJECTIVE:  Ethan Payne is here for Follow-up (RT shoulder pain)  Compared to the last office visit, his previously described symptoms are improving. He does c/o continues stiffness in the shoulder.  Current symptoms are moderate & are radiating to RT arm, not past elbow.  Pain triggered with lifting objects overhead.  He has tried using heat, taking tylenol, and doing home therapeutic exercises. He had steroid injection 11/26/17.   Xray RT shoulder done 11/26/18.   Pt had lift put in his shoe at his last visit for abnormal gait/leg length discrepancy. He reports that this has been working well for him.    ROS Denies night time disturbances. Denies fevers, chills, or night sweats. Denies unexplained weight loss. Denies personal history of cancer. Denies changes in bowel or bladder habits. Denies recent unreported falls. Denies new or worsening dyspnea or wheezing. Denies headaches or dizziness.  Denies numbness, tingling or weakness  In the extremities.  Denies dizziness or presyncopal episodes Denies lower extremity edema     HISTORY & PERTINENT PRIOR DATA:  Prior History reviewed and updated per electronic medical record.  Significant/pertinent history, findings, studies include:  reports that he has never smoked. He has never used smokeless tobacco. No results for input(s): HGBA1C, LABURIC, CREATINE in the last 8760 hours. No specialty comments available. No problems updated.  OBJECTIVE:  VS:  HT:5\' 9"  (175.3 cm)   WT:174 lb 9.6 oz (79.2 kg)  BMI:25.77    BP:122/80  HR:99bpm  TEMP: ( )  RESP:98 %   PHYSICAL EXAM: Constitutional: WDWN,  Non-toxic appearing. Psychiatric: Alert & appropriately interactive.  Not depressed or anxious appearing. Respiratory: No increased work of breathing.  Trachea Midline Eyes: Pupils are equal.  EOM intact without nystagmus.  No scleral icterus  Vascular Exam: warm to touch no edema  upper and lower extremity neuro exam: unremarkable normal sensation normal reflexes  MSK Exam: Right shoulder with limited abduction and empty can testing strength although full range of motion.  Internal and external rotation strength is intact.  Slight leg length inequality.     ASSESSMENT & PLAN:   1. Rotator cuff syndrome of right shoulder   2. Leg length difference, acquired     PLAN: Therapeutic exercises previously provided as well as nitroglycerin protocol.  If any lack of improvement can consider further diagnostic evaluation with MR I versus MSK ultrasound.   Heel lift per AVS  Follow-up: Return in about 6 weeks (around 02/18/2018).      Please see additional documentation for Objective, Assessment and Plan sections. Pertinent additional documentation may be included in corresponding procedure notes, imaging studies, problem based documentation and patient instructions. Please see these sections of the encounter for additional information regarding this visit.  CMA/ATC served as Education administrator during this visit. History, Physical, and Plan performed by medical provider. Documentation and orders reviewed and attested to.      Gerda Diss, [facility] Physician

## 2018-01-08 ENCOUNTER — Ambulatory Visit: Payer: Medicare Other | Admitting: Family Medicine

## 2018-01-08 ENCOUNTER — Encounter: Payer: Self-pay | Admitting: Family Medicine

## 2018-01-08 VITALS — BP 130/80 | HR 101 | Temp 98.4°F | Ht 69.0 in | Wt 174.5 lb

## 2018-01-08 DIAGNOSIS — I2510 Atherosclerotic heart disease of native coronary artery without angina pectoris: Secondary | ICD-10-CM

## 2018-01-08 DIAGNOSIS — B9689 Other specified bacterial agents as the cause of diseases classified elsewhere: Secondary | ICD-10-CM

## 2018-01-08 DIAGNOSIS — E785 Hyperlipidemia, unspecified: Secondary | ICD-10-CM

## 2018-01-08 DIAGNOSIS — I10 Essential (primary) hypertension: Secondary | ICD-10-CM | POA: Diagnosis not present

## 2018-01-08 DIAGNOSIS — J329 Chronic sinusitis, unspecified: Secondary | ICD-10-CM | POA: Diagnosis not present

## 2018-01-08 MED ORDER — AMOXICILLIN-POT CLAVULANATE 875-125 MG PO TABS: 1.0000 | ORAL_TABLET | Freq: Two times a day (BID) | ORAL | 0 refills | Status: AC

## 2018-01-08 NOTE — Assessment & Plan Note (Signed)
S: history of 2 vessel bypass when having surgery for atrial myxoma. History a fib before removal but none since so has not been advised anticoagulants A/P: asymptomatic today. Initial HR elevated- when I listed had regular rate and rhythm (no longer tachycardic)- doubt recurrence of a fib. Continue BP, lipid control as well as aspirin

## 2018-01-08 NOTE — Patient Instructions (Addendum)
Likely bacterial sinus infection- treat with augmentin for 7 days. If you have fever or shortness of breath or worsening symptoms please see [place] back immediately  Schedule a physical 08/21/18 or later  Also you can get an annual wellness visit before then- perhaps halfway between then and now  I would also like for you to sign up for an annual wellness visit with one of our nurses, Cassie or Manuela Schwartz, who both specialize in the annual wellness visit. This is a free benefit under medicare that may help [place] find additional ways to help you. Some highlights are reviewing medications, lifestyle, and doing a dementia screen.

## 2018-01-08 NOTE — Progress Notes (Signed)
Subjective:  Ethan Payne is a 72 y.o. year old very pleasant male patient who presents for/with See problem oriented charting ROS- has sinus congestion and discharge, has sinus pressure. No fever or chills. No shortness of breath   Past Medical History-  Patient Active Problem List   Diagnosis Date Noted  . CAD (coronary artery disease) 02/17/2015    Priority: High  . s/p excision of Atrial myxoma 02/04/2008    Priority: High  . BPH associated with nocturia 04/10/2015    Priority: Medium  . Hyperlipidemia 02/04/2008    Priority: Medium  . Essential hypertension 02/04/2008    Priority: Medium  . Health care maintenance 02/18/2015    Priority: Low  . Erectile dysfunction 02/18/2015    Priority: Low  . Heel pain 03/22/2011    Priority: Low  . GERD (gastroesophageal reflux disease) 03/22/2011    Priority: Low  . Osteoarthritis of right knee 04/18/2010    Priority: Low  . Leg length difference, acquired 01/07/2018  . Rotator cuff syndrome of right shoulder 12/08/2017    Medications- reviewed and updated Current Outpatient Medications  Medication Sig Dispense Refill  . amLODipine (NORVASC) 5 MG tablet TAKE 1 TABLET (5 MG TOTAL) BY MOUTH DAILY. 90 tablet 3  . aspirin 325 MG tablet Take 325 mg by mouth daily.      Marland Kitchen lisinopril (PRINIVIL,ZESTRIL) 20 MG tablet TAKE 1 TABLET (20 MG TOTAL) BY MOUTH DAILY. 90 tablet 2  . nitroGLYCERIN (NITRODUR - DOSED IN MG/24 HR) 0.2 mg/hr patch Place 1/4 to 1/2 of a patch over affected region. Remove and replace once daily.  Slightly alter skin placement daily 30 patch 1  . rosuvastatin (CRESTOR) 20 MG tablet Take 1 tablet (20 mg total) by mouth daily. 90 tablet 3  . amoxicillin-clavulanate (AUGMENTIN) 875-125 MG tablet Take 1 tablet by mouth 2 (two) times daily for 7 days. 14 tablet 0   No current facility-administered medications for this visit.    Objective: BP 130/80   Pulse (!) 101   Temp 98.4 F (36.9 C) (Oral)   Ht 5\' 9"  (1.753 m)    Wt 174 lb 8 oz (79.2 kg)   BMI 25.77 kg/m  Gen: NAD, resting comfortably Yellow discharge in both nares, maxillary sinus tenderness CV: RRR- not tachycardic on my exam no murmurs rubs or gallops Lungs: CTAB no crackles, wheeze, rhonchi Skin: warm, dry  Assessment/Plan:  Hypertension S: controlled on amlodipine 5mg  in PM and liisnopril 20mg  in AM BP Readings from Last 3 Encounters:  01/08/18 130/80  01/07/18 122/80  11/26/17 (!) 190/94  A/P: We discussed blood pressure goal of <140/90. Continue current meds:  Controlled on repeat today in office  Bacterial sinusitis S: 3 weeks of sinus congestion and pressure also a cough. Green drainage from nose and also coughing this up when lays down.  nyquil didn't help and caused reflux. Has some old hydrocodone cough syrup but didn't take this . States has had tachycardia with mucinex in past. Took advil cold and sinus last night A/P: from avs "Likely bacterial sinus infection- treat with augmentin for 7 days. If you have fever or shortness of breath or worsening symptoms please see [place] back immediately"  Hyperlipidemia S: poorly controlled on last visit with crestor 20mg   Lab Results  Component Value Date   CHOL 186 08/21/2017   HDL 60.40 08/21/2017   LDLCALC 104 (H) 08/21/2017   LDLDIRECT 203.9 10/07/2011   TRIG 107.0 08/21/2017   CHOLHDL 3 08/21/2017  A/P: not ideal control. With history CABG (Though this was primarily done due to him already having open heart surgery for myxoma)- would like to at least push for LDL under 100- consider increasing dose if not at goal by physical this year  CAD (coronary artery disease) S: history of 2 vessel bypass when having surgery for atrial myxoma. History a fib before removal but none since so has not been advised anticoagulants A/P: asymptomatic today. Initial HR elevated- when I listed had regular rate and rhythm (no longer tachycardic)- doubt recurrence of a fib. Continue BP, lipid control as  well as aspirin  Future Appointments  Date Time Provider [place]  02/18/2018 10:00 AM Gerda Diss, DO [facility]  04/08/2018 11:00 AM Williemae Area, RN [facility]  08/17/2018  9:30 AM Marin Olp, MD [facility]   Schedule a physical 08/21/18 or later  Also you can get an annual wellness visit before then- perhaps halfway between then and now  Meds ordered this encounter  Medications  . amoxicillin-clavulanate (AUGMENTIN) 875-125 MG tablet    Sig: Take 1 tablet by mouth 2 (two) times daily for 7 days.    Dispense:  14 tablet    Refill:  0    Return precautions advised.  Garret Reddish, MD

## 2018-01-08 NOTE — Assessment & Plan Note (Signed)
S: poorly controlled on last visit with crestor 20mg   Lab Results  Component Value Date   CHOL 186 08/21/2017   HDL 60.40 08/21/2017   LDLCALC 104 (H) 08/21/2017   LDLDIRECT 203.9 10/07/2011   TRIG 107.0 08/21/2017   CHOLHDL 3 08/21/2017   A/P: not ideal control. With history CABG (Though this was primarily done due to him already having open heart surgery for myxoma)- would like to at least push for LDL under 100- consider increasing dose if not at goal by physical this year

## 2018-01-09 NOTE — Progress Notes (Signed)
This is complete.

## 2018-01-09 NOTE — Progress Notes (Signed)
Please contact patient to schedule CPE; it should be 08/21/18 or later to make sure insurance pays (full calendar year)

## 2018-01-09 NOTE — Progress Notes (Signed)
Physical has been changed.

## 2018-02-18 ENCOUNTER — Ambulatory Visit: Payer: Medicare Other | Admitting: Sports Medicine

## 2018-02-28 ENCOUNTER — Encounter: Payer: Self-pay | Admitting: Sports Medicine

## 2018-04-08 ENCOUNTER — Ambulatory Visit: Payer: Medicare Other | Admitting: *Deleted

## 2018-07-13 ENCOUNTER — Other Ambulatory Visit: Payer: Self-pay

## 2018-07-13 MED ORDER — LISINOPRIL 20 MG PO TABS: ORAL_TABLET | ORAL | 2 refills | Status: DC

## 2018-08-04 ENCOUNTER — Other Ambulatory Visit: Payer: Self-pay

## 2018-08-04 MED ORDER — ROSUVASTATIN CALCIUM 20 MG PO TABS: 20.0000 mg | ORAL_TABLET | Freq: Every day | ORAL | 3 refills | Status: DC

## 2018-08-17 ENCOUNTER — Ambulatory Visit: Payer: Medicare Other | Admitting: Family Medicine

## 2018-08-24 ENCOUNTER — Encounter: Payer: Self-pay | Admitting: Family Medicine

## 2018-08-24 ENCOUNTER — Ambulatory Visit (INDEPENDENT_AMBULATORY_CARE_PROVIDER_SITE_OTHER): Payer: Medicare Other | Admitting: Family Medicine

## 2018-08-24 VITALS — BP 136/82 | HR 68 | Temp 98.0°F | Ht 69.0 in | Wt 175.8 lb

## 2018-08-24 DIAGNOSIS — E785 Hyperlipidemia, unspecified: Secondary | ICD-10-CM

## 2018-08-24 DIAGNOSIS — Z0000 Encounter for general adult medical examination without abnormal findings: Secondary | ICD-10-CM | POA: Diagnosis not present

## 2018-08-24 DIAGNOSIS — M75101 Unspecified rotator cuff tear or rupture of right shoulder, not specified as traumatic: Secondary | ICD-10-CM

## 2018-08-24 DIAGNOSIS — R972 Elevated prostate specific antigen [PSA]: Secondary | ICD-10-CM

## 2018-08-24 DIAGNOSIS — Z0001 Encounter for general adult medical examination with abnormal findings: Secondary | ICD-10-CM | POA: Diagnosis not present

## 2018-08-24 LAB — CBC
HEMATOCRIT: 42.6 % (ref 39.0–52.0)
Hemoglobin: 14.8 g/dL (ref 13.0–17.0)
MCHC: 34.8 g/dL (ref 30.0–36.0)
MCV: 90.2 fl (ref 78.0–100.0)
Platelets: 262 10*3/uL (ref 150.0–400.0)
RBC: 4.73 Mil/uL (ref 4.22–5.81)
RDW: 13.1 % (ref 11.5–15.5)
WBC: 7 10*3/uL (ref 4.0–10.5)

## 2018-08-24 LAB — COMPREHENSIVE METABOLIC PANEL
ALBUMIN: 4.3 g/dL (ref 3.5–5.2)
ALT: 17 U/L (ref 0–53)
AST: 16 U/L (ref 0–37)
Alkaline Phosphatase: 37 U/L — ABNORMAL LOW (ref 39–117)
BILIRUBIN TOTAL: 0.5 mg/dL (ref 0.2–1.2)
BUN: 13 mg/dL (ref 6–23)
CO2: 31 mEq/L (ref 19–32)
CREATININE: 1.04 mg/dL (ref 0.40–1.50)
Calcium: 9.8 mg/dL (ref 8.4–10.5)
Chloride: 100 mEq/L (ref 96–112)
GFR: 75.08 mL/min (ref >60.00)
Glucose, Bld: 99 mg/dL (ref 70–99)
POTASSIUM: 4.4 meq/L (ref 3.5–5.1)
SODIUM: 137 meq/L (ref 135–145)
Total Protein: 7.5 g/dL (ref 6.0–8.3)

## 2018-08-24 LAB — LIPID PANEL
CHOLESTEROL: 276 mg/dL — AB (ref 0–200)
HDL: 49.4 mg/dL (ref >39.00)
LDL CALC: 206 mg/dL — AB (ref 0–99)
NonHDL: 226.73
Total CHOL/HDL Ratio: 6
Triglycerides: 102 mg/dL (ref 0.0–149.0)
VLDL: 20.4 mg/dL (ref 0.0–40.0)

## 2018-08-24 MED ORDER — METHYLPREDNISOLONE ACETATE 40 MG/ML IJ SUSP
40.0000 mg | Freq: Once | INTRAMUSCULAR | Status: AC
  Administered 2018-08-24: 40 mg via INTRA_ARTICULAR

## 2018-08-24 NOTE — Patient Instructions (Addendum)
Health Maintenance Due  Topic Date Due  . PNA vac Low Risk Adult (1 of 2 - PCV13)- he declined along with flu shot 10/22/2013   From my note today "He has some limited ROM and I told him that possibly intraarticular instead of subacromial injection may be better- he would prefer to trial injection and see Dr. Paulla Fore back if not improved. He had improved ROM slightly after injection- agrees to see Dr. Paulla Fore back if he is not improving in 2-3 weeks. He will restart home exercises Dr. Paulla Fore gave him previously. "  Lets check in 6 months from now. Also can go ahead and schedule a physical for 1 year from today.   Please stop by lab before you go   You had an injection today.  Things to be aware of after injection are listed below: . You may experience no significant improvement or even a slight worsening in your symptoms during the first 24 to 48 hours.  After that we expect your symptoms to improve gradually over the next 2 weeks for the medicine to have its maximal effect.  You should continue to have improvement out to 6 weeks after your injection. . Dr. Yong Channel recommends icing the site of the injection for 20 minutes at least 2 times the day of your injection . You may shower but no swimming, tub bath or Jacuzzi for 24 hours. . If your bandage falls off this does not need to be replaced.  It is appropriate to remove the bandage after 4 hours. . You may resume light activities as tolerated unless otherwise directed per Dr. Yong Channel during your visit  POSSIBLE STEROID SIDE EFFECTS:  Side effects from injectable steroids tend to be less than when taken orally however you may experience some of the symptoms listed below.  If experienced these should only last for a short period of time. Change in menstrual flow  Edema (swelling)  Increased appetite Skin flushing (redness)  Skin rash/acne  Thrush (oral) Yeast vaginitis    Increased sweating  Depression Increased blood glucose levels Cramping and  leg/calf  Euphoria (feeling happy)  POSSIBLE PROCEDURE SIDE EFFECTS: The side effects of the injection are usually fairly minimal however if you may experience some of the following side effects that are usually self-limited and will is off on their own.  If you are concerned please feel free to call the office with questions:  Increased numbness or tingling  Nausea or vomiting  Swelling or bruising at the injection site   Please call our office if if you experience any of the following symptoms over the next week as these can be signs of infection:   Fever greater than 100.33F  Significant swelling at the injection site  Significant redness or drainage from the injection site  If after 2 weeks you are continuing to have worsening symptoms please call our office to discuss what the next appropriate actions should be including the potential for a return office visit or other diagnostic testing.

## 2018-08-24 NOTE — Progress Notes (Signed)
Phone: [phone]  Subjective:  Patient presents today for their annual physical. Chief complaint-noted.   See problem oriented charting- ROS- full  review of systems was completed and negative except for right shoulder pain. No chest pain or shortness of breath. No headache or blurry vision.   The following were reviewed and entered/updated in epic: Past Medical History:  Diagnosis Date  . Acute prostatitis 02/04/2008  . ATRIAL MYXOMA 02/04/2008  . Closed fracture of four ribs 11/01/2009  . HYPERLIPIDEMIA 02/04/2008  . HYPERTENSION 02/04/2008  . Osteoarthritis of right knee 04/18/2010   Patient Active Problem List   Diagnosis Date Noted  . CAD (coronary artery disease) 02/17/2015    Priority: High  . s/p excision of Atrial myxoma 02/04/2008    Priority: High  . BPH associated with nocturia 04/10/2015    Priority: Medium  . Hyperlipidemia 02/04/2008    Priority: Medium  . Essential hypertension 02/04/2008    Priority: Medium  . Health care maintenance 02/18/2015    Priority: Low  . Erectile dysfunction 02/18/2015    Priority: Low  . Heel pain 03/22/2011    Priority: Low  . GERD (gastroesophageal reflux disease) 03/22/2011    Priority: Low  . Osteoarthritis of right knee 04/18/2010    Priority: Low  . Leg length difference, acquired 01/07/2018  . Rotator cuff syndrome of right shoulder 12/08/2017   Past Surgical History:  Procedure Laterality Date  . cardio version for a fibi    . CORONARY ARTERY BYPASS GRAFT     2 vessel during atrial myxoma procedure  . removal of atrial myxoma      Family History  Problem Relation Age of Onset  . Stroke Mother   . Heart disease Father        MI 63  . Hypertension Father   . Ovarian cancer Sister     Medications- reviewed and updated Current Outpatient Medications  Medication Sig Dispense Refill  . amLODipine (NORVASC) 5 MG tablet TAKE 1 TABLET (5 MG TOTAL) BY MOUTH DAILY. 90 tablet 3  . aspirin 325 MG tablet Take 325 mg by  mouth daily.      Marland Kitchen lisinopril (PRINIVIL,ZESTRIL) 20 MG tablet TAKE 1 TABLET (20 MG TOTAL) BY MOUTH DAILY. 90 tablet 2  . rosuvastatin (CRESTOR) 20 MG tablet Take 1 tablet (20 mg total) by mouth daily. 90 tablet 3  . nitroGLYCERIN (NITRODUR - DOSED IN MG/24 HR) 0.2 mg/hr patch Place 1/4 to 1/2 of a patch over affected region. Remove and replace once daily.  Slightly alter skin placement daily (Patient not taking: Reported on 08/24/2018) 30 patch 1   No current facility-administered medications for this visit.     Allergies-reviewed and updated Allergies  Allergen Reactions  . Claritin [Loratadine] Other (See Comments)    Heart speeds up  . Hydrocodone     After rib fractures. Hydrocodone-States had shortness of breath, 911 was called    Social History   Social History Narrative   Married will be 50 years in 2019 (wife seen at another practice). 2 children-daughter unmarried, professional student 95 and son 68 in 2016. 2 grandsons-17 and 6 in 2019.       Still works as Clinical biochemist, does his own carpentry work      Hobbies: fishing    Objective: BP 136/82 (BP Location: Left Arm, Patient Position: Sitting, Cuff Size: Large)   Pulse 68   Temp 98 F (36.7 C) (Oral)   Ht 5\' 9"  (1.753 m)  Wt 175 lb 12.8 oz (79.7 kg)   SpO2 96%   BMI 25.96 kg/m  Gen: NAD, resting comfortably HEENT: Mucous membranes are moist. Oropharynx normal Neck: no thyromegaly CV: RRR no murmurs rubs or gallops Lungs: CTAB no crackles, wheeze, rhonchi Abdomen: soft/nontender/nondistended/normal bowel sounds. No rebound or guarding.  Ext: no edema Skin: warm, dry Neuro: grossly normal, moves all extremities, PERRLA  Rectal deferred- likely will need to return to urology depending on PSA trend  MSK: Right Shoulder: Inspection reveals no abnormalities, atrophy or asymmetry. Palpation is normal with no tenderness over AC joint or bicipital groove. Does have some pain in posterior portion.  ROM  with abduction limited to 135 degrees and about 150 degrees forward flexion, otherwise normal. Some pain with push off from back. Improved ROM after injection with less pain.  Positive empty can.   no drop arm sign.  Shoulder injection verbal consent obtained and verified. Sterile betadine prep. Furthur cleansed with alcohol. Topical analgesic spray: Ethyl chloride. Joint: right subacromial injection Approached in typical fashion with: posterior approach Completed without difficulty Meds: 3 cc lidocaine 2% no epi, 1 cc depomedrol 40mg /cc Needle:1.5 inch 25 gauge Aftercare instructions and Red flags advised. Immediate improvement in pain noted  Assessment/Plan:  72 y.o. male presenting for annual physical.  Health Maintenance counseling: 1. Anticipatory guidance: Patient counseled regarding regular dental exams -q6 months, eye exams -yearly, wearing seatbelts.  2. Risk factor reduction:  Advised patient of need for regular exercise and diet rich and fruits and vegetables to reduce risk of heart attack and stroke. Exercise- home gym daily (framing a house right now and holding off on home gym). Diet-reasonably balanced.  Wt Readings from Last 3 Encounters:  08/24/18 175 lb 12.8 oz (79.7 kg)  01/08/18 174 lb 8 oz (79.2 kg)  01/07/18 174 lb 9.6 oz (79.2 kg)  3. Immunizations/screenings/ancillary studies- declines flu and prevnar 13.  Immunization History  Administered Date(s) Administered  . Influenza Whole 09/01/2009  . Influenza,inj,Quad PF,6+ Mos 01/14/2014  . Pneumococcal Polysaccharide-23 10/03/2009  . Td 05/05/2009  . Tdap 08/01/2014  4. Prostate cancer screening-  seen in January by urology with plans for 6 month repeat free and total PSA. BPH on prior exams here. He would like to see a new urologist in the group if # still trending up Lab Results  Component Value Date   PSA 4.08 (H) 10/09/2017   PSA 3.89 08/21/2017   PSA 2.57 04/22/2016   5. Colon cancer screening -  09/2017 cologuard- repeat in 3 years 6. Skin cancer screening- [facility] he thinks - sees as needed. advised regular sunscreen use. Denies worrisome, changing, or new skin lesions.  7. Never smoker  Status of chronic or acute concerns   HTN- controlled on lisinopril 20mg  in AM and amlodipine 5 mg in PM.   CAD/HLD- LDL has been above 100- had CABG x 2 vessels but mainly due to already having open heart surgery. He is on crestor 20mg . Update lipids and if LDL at least not below 100 would increase to 40mg   A fib before myxoma removal but no issues since then and not on long term anticoagulants.   Right shoulder pain- has seen Dr. Paulla Fore back in February - had right subacromial injection at that time. Had underlying chronic tear- later advised nitroglycerin- but made him dizzy. A month ago nail gun got jerked back and started having some shoulder sensitivity, and then had same thing with pressure washer. Pain up to 9/10 with  activity overhead. He asks me to perform an injection today. He has some limited ROM and I told him that possibly intraarticular instead of subacromial injection may be better- he would prefer to trial injection and see Dr. Paulla Fore back if not improved. He had improved ROM slightly after injection- agrees to see Dr. Paulla Fore back if he is not improving in 2-3 weeks. He will restart home exercises Dr. Paulla Fore gave him previously.   Return in about 6 months (around 02/22/2019) for follow up- or sooner if needed.  Lab/Order associations: Preventative health care - Plan: Lipid panel, CBC, Comprehensive metabolic panel, PSA, total and free  Rotator cuff syndrome of right shoulder  Hyperlipidemia, unspecified hyperlipidemia type - Plan: Lipid panel, CBC, Comprehensive metabolic panel  Elevated PSA - Plan: PSA, total and free  Return precautions advised.  Garret Reddish, MD

## 2018-08-24 NOTE — Addendum Note (Signed)
Addended by: Lyndle Herrlich on: 08/24/2018 09:41 AM   Modules accepted: Orders

## 2018-08-25 LAB — PSA, TOTAL AND FREE
PSA, % FREE: 18 % — AB (ref >25)
PSA, Free: 0.6 ng/mL
PSA, Total: 3.4 ng/mL (ref <=4.0)

## 2018-09-02 ENCOUNTER — Other Ambulatory Visit: Payer: Self-pay | Admitting: Family Medicine

## 2018-09-02 MED ORDER — AMLODIPINE BESYLATE 5 MG PO TABS: ORAL_TABLET | ORAL | 1 refills | Status: DC

## 2018-09-02 NOTE — Telephone Encounter (Signed)
Copied from [place] [id number]. Topic: Quick Communication - Rx Refill/Question >> Sep 02, 2018  9:02 AM Scherrie Gerlach wrote: Medication: amLODipine (NORVASC) 5 MG tablet 90 day with refills  Pt had his preventative last week and this should have been refilled at visit. Please send to [id number] [address], [place]. [phone] (Phone) [phone] (Fax)

## 2019-02-22 ENCOUNTER — Ambulatory Visit: Payer: Medicare Other | Admitting: Family Medicine

## 2019-03-10 ENCOUNTER — Other Ambulatory Visit: Payer: Self-pay

## 2019-03-10 MED ORDER — AMLODIPINE BESYLATE 5 MG PO TABS: ORAL_TABLET | ORAL | 1 refills | Status: DC

## 2019-03-10 NOTE — Telephone Encounter (Signed)
Pharmacy request [facility]  #90/1

## 2019-04-09 ENCOUNTER — Ambulatory Visit: Payer: Medicare Other | Admitting: *Deleted

## 2019-04-17 ENCOUNTER — Other Ambulatory Visit: Payer: Self-pay | Admitting: Family Medicine

## 2019-04-19 ENCOUNTER — Encounter: Payer: Self-pay | Admitting: Family Medicine

## 2019-04-19 ENCOUNTER — Ambulatory Visit (INDEPENDENT_AMBULATORY_CARE_PROVIDER_SITE_OTHER): Payer: Medicare Other | Admitting: Family Medicine

## 2019-04-19 VITALS — BP 133/83 | HR 72 | Ht 69.0 in | Wt 178.0 lb

## 2019-04-19 DIAGNOSIS — I10 Essential (primary) hypertension: Secondary | ICD-10-CM

## 2019-04-19 DIAGNOSIS — I2510 Atherosclerotic heart disease of native coronary artery without angina pectoris: Secondary | ICD-10-CM | POA: Diagnosis not present

## 2019-04-19 DIAGNOSIS — E785 Hyperlipidemia, unspecified: Secondary | ICD-10-CM | POA: Diagnosis not present

## 2019-04-19 MED ORDER — LISINOPRIL 20 MG PO TABS: ORAL_TABLET | ORAL | 3 refills | Status: DC

## 2019-04-19 MED ORDER — AMLODIPINE BESYLATE 5 MG PO TABS: ORAL_TABLET | ORAL | 3 refills | Status: DC

## 2019-04-19 MED ORDER — ROSUVASTATIN CALCIUM 20 MG PO TABS: 20.0000 mg | ORAL_TABLET | Freq: Every day | ORAL | 3 refills | Status: DC

## 2019-04-19 NOTE — Progress Notes (Signed)
Phone [phone]   Subjective:  Virtual visit via phonenote Chief Complaint  Patient presents with  .  hypertension, hyperlipidemia   This visit type was conducted due to national recommendations for restrictions regarding the COVID-19 Pandemic (e.g. social distancing).  This format is felt to be most appropriate for this patient at this time balancing risks to patient and risks to population by having him in for in person visit.  All issues noted in this document were discussed and addressed.  No physical exam was performed (except for noted visual exam or audio findings with Telehealth visits).  The patient has consented to conduct a Telehealth visit and understands insurance will be billed.   Our team/I connected with Barbaraann Boys at  1:40 PM EDT by phone (patient did not have equipment for webex) and verified that I am speaking with the correct person using two identifiers.  Location patient: [facility] Location provider: [facility], office Persons participating in the virtual visit:  patient  Time on phone: 7 minutes Counseling provided about blood pressure  Our team/I discussed the limitations of evaluation and management by telemedicine and the availability of in person appointments. In light of current covid-19 pandemic, patient also understands that we are trying to protect them by minimizing in office contact if at all possible.  The patient expressed consent for telemedicine visit and agreed to proceed. Patient understands insurance will be billed.   ROS- No chest pain or shortness of breath. No headache or blurry vision.    Past Medical History-  Patient Active Problem List   Diagnosis Date Noted  . CAD (coronary artery disease) 02/17/2015    Priority: High  . s/p excision of Atrial myxoma 02/04/2008    Priority: High  . BPH associated with nocturia 04/10/2015    Priority: Medium  . Hyperlipidemia 02/04/2008    Priority: Medium  . Essential hypertension 02/04/2008    Priority: Medium  . Health care maintenance 02/18/2015    Priority: Low  . Erectile dysfunction 02/18/2015    Priority: Low  . Heel pain 03/22/2011    Priority: Low  . GERD (gastroesophageal reflux disease) 03/22/2011    Priority: Low  . Osteoarthritis of right knee 04/18/2010    Priority: Low  . Leg length difference, acquired 01/07/2018  . Rotator cuff syndrome of right shoulder 12/08/2017    Medications- reviewed and updated Current Outpatient Medications  Medication Sig Dispense Refill  . amLODipine (NORVASC) 5 MG tablet TAKE 1 TABLET (5 MG TOTAL) BY MOUTH DAILY. 90 tablet 3  . aspirin 325 MG tablet Take 325 mg by mouth daily.      . rosuvastatin (CRESTOR) 20 MG tablet Take 1 tablet (20 mg total) by mouth daily. 90 tablet 3  . lisinopril (ZESTRIL) 20 MG tablet TAKE 1 TABLET (20 MG TOTAL) BY MOUTH DAILY. 90 tablet 3   No current facility-administered medications for this visit.      Objective:  BP 133/83   Pulse 72   Ht 5\' 9"  (1.753 m)   Wt 178 lb (80.7 kg)   BMI 26.29 kg/m  self reported vitals -  Nonlabored voice, normal speech      Assessment and Plan   #hypertension S: controlled on amlodipine 5 mg and lisinopril 20mg  BP Readings from Last 3 Encounters:  04/19/19 133/83  08/24/18 136/82  01/08/18 130/80  A/P: Stable. Continue current medications.   #hyperlipidemia S: poorly controlled on last check but was only taking his statin about once a week- after  seeing elevated #s he increased to daily rx Lab Results  Component Value Date   CHOL 276 (H) 08/24/2018   HDL 49.40 08/24/2018   LDLCALC 206 (H) 08/24/2018   LDLDIRECT 203.9 10/07/2011   TRIG 102.0 08/24/2018   CHOLHDL 6 08/24/2018   A/P: hopefully improved on regular statin use daily- will check lipids in September at his physical  # CAD- s/p stent but primarily because he was already having open heart surgery S:compliant with aspirin 325 every other day. Also takes statin.   A/P:  asymptomatic/Stable. Continue current medications.    CPE  Future Appointments  Date Time Provider [place]  08/26/2019  8:40 AM Marin Olp, MD [facility]   Lab/Order associations: Hyperlipidemia, unspecified hyperlipidemia type  Essential hypertension  Coronary artery disease involving native coronary artery of native heart without angina pectoris  Meds ordered this encounter  Medications  . lisinopril (ZESTRIL) 20 MG tablet    Sig: TAKE 1 TABLET (20 MG TOTAL) BY MOUTH DAILY.    Dispense:  90 tablet    Refill:  3  . amLODipine (NORVASC) 5 MG tablet    Sig: TAKE 1 TABLET (5 MG TOTAL) BY MOUTH DAILY.    Dispense:  90 tablet    Refill:  3  . rosuvastatin (CRESTOR) 20 MG tablet    Sig: Take 1 tablet (20 mg total) by mouth daily.    Dispense:  90 tablet    Refill:  3    Return precautions advised.  Garret Reddish, MD

## 2019-04-19 NOTE — Telephone Encounter (Signed)
Last OV 08/24/2018 Last refill 07/13/18 #90/2 Next OV not scheduled  Called pt and scheduled telephone visit with Dr. Yong Channel for today at 1:40. He does not have access to a smart phone or computer with a web cam/mic.

## 2019-04-19 NOTE — Patient Instructions (Addendum)
Health Maintenance Due  Topic Date Due  . PNA vac Low Risk Adult (1 of 2 - PCV13)- discuss at CPE- reasonable to do given CAD history 10/22/2013    Depression screen [facility] 2/9 08/24/2018 08/21/2017 04/22/2016  Decreased Interest 0 0 0  Down, Depressed, Hopeless 0 0 0  PHQ - 2 Score 0 0 0   Video visit

## 2019-05-26 ENCOUNTER — Other Ambulatory Visit: Payer: Self-pay

## 2019-05-26 ENCOUNTER — Ambulatory Visit (INDEPENDENT_AMBULATORY_CARE_PROVIDER_SITE_OTHER): Payer: Medicare Other | Admitting: Family Medicine

## 2019-05-26 ENCOUNTER — Encounter: Payer: Self-pay | Admitting: Family Medicine

## 2019-05-26 VITALS — BP 124/82 | HR 89 | Temp 98.6°F | Ht 69.0 in | Wt 173.0 lb

## 2019-05-26 DIAGNOSIS — E663 Overweight: Secondary | ICD-10-CM

## 2019-05-26 DIAGNOSIS — I10 Essential (primary) hypertension: Secondary | ICD-10-CM | POA: Diagnosis not present

## 2019-05-26 DIAGNOSIS — R972 Elevated prostate specific antigen [PSA]: Secondary | ICD-10-CM | POA: Diagnosis not present

## 2019-05-26 DIAGNOSIS — H6123 Impacted cerumen, bilateral: Secondary | ICD-10-CM | POA: Diagnosis not present

## 2019-05-26 DIAGNOSIS — E785 Hyperlipidemia, unspecified: Secondary | ICD-10-CM

## 2019-05-26 DIAGNOSIS — R351 Nocturia: Secondary | ICD-10-CM

## 2019-05-26 DIAGNOSIS — N401 Enlarged prostate with lower urinary tract symptoms: Secondary | ICD-10-CM

## 2019-05-26 LAB — COMPREHENSIVE METABOLIC PANEL
ALT: 23 U/L (ref 0–53)
AST: 22 U/L (ref 0–37)
Albumin: 4.6 g/dL (ref 3.5–5.2)
Alkaline Phosphatase: 41 U/L (ref 39–117)
BUN: 14 mg/dL (ref 6–23)
CO2: 31 mEq/L (ref 19–32)
Calcium: 10.4 mg/dL (ref 8.4–10.5)
Chloride: 102 mEq/L (ref 96–112)
Creatinine, Ser: 1.1 mg/dL (ref 0.40–1.50)
GFR: 66.07 mL/min (ref >60.00)
Glucose, Bld: 104 mg/dL — ABNORMAL HIGH (ref 70–99)
Potassium: 4.1 mEq/L (ref 3.5–5.1)
Sodium: 141 mEq/L (ref 135–145)
Total Bilirubin: 0.9 mg/dL (ref 0.2–1.2)
Total Protein: 7.4 g/dL (ref 6.0–8.3)

## 2019-05-26 LAB — CBC
HCT: 44.7 % (ref 39.0–52.0)
Hemoglobin: 15.4 g/dL (ref 13.0–17.0)
MCHC: 34.4 g/dL (ref 30.0–36.0)
MCV: 90 fl (ref 78.0–100.0)
Platelets: 260 10*3/uL (ref 150.0–400.0)
RBC: 4.97 Mil/uL (ref 4.22–5.81)
RDW: 12.9 % (ref 11.5–15.5)
WBC: 7.3 10*3/uL (ref 4.0–10.5)

## 2019-05-26 LAB — LIPID PANEL
Cholesterol: 185 mg/dL (ref 0–200)
HDL: 48.1 mg/dL (ref >39.00)
LDL Cholesterol: 117 mg/dL — ABNORMAL HIGH (ref 0–99)
NonHDL: 137.24
Total CHOL/HDL Ratio: 4
Triglycerides: 101 mg/dL (ref 0.0–149.0)
VLDL: 20.2 mg/dL (ref 0.0–40.0)

## 2019-05-26 MED ORDER — SILDENAFIL CITRATE 20 MG PO TABS: ORAL_TABLET | ORAL | 3 refills | Status: DC

## 2019-05-26 NOTE — Patient Instructions (Signed)
Mineral oil for ear full of wax Purchase mineral oil from laxative aisle Lay down on your side with ear that is bothering you facing up (right ear that still has wax) Use 3-4 drops with a dropper and place in ear for 30 seconds Place cotton swab outside of ear Turn to other side and allow this to drain Repeat 3-4 x a day for up to 5-7 days.  Return to see [place] if not improving within a few days

## 2019-05-26 NOTE — Progress Notes (Addendum)
Phone [phone]   Subjective:  Ethan Payne is a 73 y.o. year old very pleasant male patient who presents for/with See problem oriented charting Chief Complaint  Patient presents with  . ear cleaning    decreased hearing x 1 month   ROS- No chest pain or shortness of breath. No headache or blurry vision.    Past Medical History-  Patient Active Problem List   Diagnosis Date Noted  . CAD (coronary artery disease) 02/17/2015    Priority: High  . s/p excision of Atrial myxoma 02/04/2008    Priority: High  . BPH associated with nocturia 04/10/2015    Priority: Medium  . Hyperlipidemia 02/04/2008    Priority: Medium  . Essential hypertension 02/04/2008    Priority: Medium  . Health care maintenance 02/18/2015    Priority: Low  . Erectile dysfunction 02/18/2015    Priority: Low  . Heel pain 03/22/2011    Priority: Low  . GERD (gastroesophageal reflux disease) 03/22/2011    Priority: Low  . Osteoarthritis of right knee 04/18/2010    Priority: Low  . Leg length difference, acquired 01/07/2018  . Rotator cuff syndrome of right shoulder 12/08/2017    Medications- reviewed and updated Current Outpatient Medications  Medication Sig Dispense Refill  . amLODipine (NORVASC) 5 MG tablet TAKE 1 TABLET (5 MG TOTAL) BY MOUTH DAILY. 90 tablet 3  . aspirin 325 MG tablet Take 325 mg by mouth daily.      Marland Kitchen lisinopril (ZESTRIL) 20 MG tablet TAKE 1 TABLET (20 MG TOTAL) BY MOUTH DAILY. 90 tablet 3  . rosuvastatin (CRESTOR) 20 MG tablet Take 1 tablet (20 mg total) by mouth daily. 90 tablet 3   No current facility-administered medications for this visit.      Objective:  BP 124/82 (BP Location: Left Arm, Patient Position: Sitting, Cuff Size: Normal)   Pulse 89   Temp 98.6 F (37 C) (Temporal)   Ht 5\' 9"  (1.753 m)   Wt 173 lb (78.5 kg)   SpO2 94%   BMI 25.55 kg/m  Gen: NAD, resting comfortably Copious cerumen noted initially in bilateral ears- after irrigation light amount of  deep wax in right ear where left ear is completley clear- normal visualization of TM bilaterally (able to see portion of it on the right but fully on left)  CV: RRR no murmurs rubs or gallops Lungs: CTAB no crackles, wheeze, rhonchi Abdomen: soft/nontender/ Ext: no edema Skin: warm, dry      Assessment and Plan   # hearing loss -both sides related to cerumen S: patient has noted difficulty hearing over the last month. Tried q tips but issues got worse. No ear pain or congestion in ears. Reports feeling of fullness in ears.  A/P: improved hearing with irrigation today- does have small amount of deep wax in right ear that did not respond well to irrigation- encouraged trial of mineral oil   #hypertension/overweight S: controlled on amlodipine 5 mg and lisinopril 20mg . Exercise has been down with shoulder- had been exercising daily for 10 years.  BP Readings from Last 3 Encounters:  05/26/19 124/82  04/19/19 133/83  08/24/18 136/82  A/P: Stable. Continue current medications. miildly overweight but I dont feel strongly about weight loss- Encouraged need for healthy eating, regular exercise to be continued  #hyperlipidemia/CAD S: hopefully controlled on crestor 20mg  (increased last year)  Bypass 13 years ago and was released by cardiology.  Lab Results  Component Value Date   CHOL 276 (H) 08/24/2018  HDL 49.40 08/24/2018   LDLCALC 206 (H) 08/24/2018   LDLDIRECT 203.9 10/07/2011   TRIG 102.0 08/24/2018   CHOLHDL 6 08/24/2018   A/P: hoepfully controlled- update lipids today. Chest pain free- consider updating EKG at future visit  #elevated PSA likely due to BPH- does not want to return to [facility]rology or urology in general unless drastically increased risk.   # ED- trial sildenafil.   Future Appointments  Date Time Provider [place]  08/26/2019  8:40 AM Marin Olp, MD [facility]   Lab/Order associations: coffee with creamer   ICD-10-CM   1. Hearing loss  of both ears due to cerumen impaction  H61.23   2. Essential hypertension  I10   3. Overweight  E66.3   4. Hyperlipidemia, unspecified hyperlipidemia type  E78.5 CBC    Comprehensive metabolic panel    Lipid panel  5. Elevated PSA  R97.20 PSA, total and free   Return precautions advised.  Garret Reddish, MD

## 2019-05-26 NOTE — Addendum Note (Signed)
Addended by: Marin Olp on: 05/26/2019 09:40 AM   Modules accepted: Orders

## 2019-05-27 LAB — PSA, TOTAL AND FREE
PSA, % Free: 26 % (calc) (ref >25)
PSA, Free: 1 ng/mL
PSA, Total: 3.8 ng/mL (ref <=4.0)

## 2019-06-21 DIAGNOSIS — H25812 Combined forms of age-related cataract, left eye: Secondary | ICD-10-CM | POA: Diagnosis not present

## 2019-07-05 DIAGNOSIS — H25812 Combined forms of age-related cataract, left eye: Secondary | ICD-10-CM | POA: Diagnosis not present

## 2019-07-05 DIAGNOSIS — H2512 Age-related nuclear cataract, left eye: Secondary | ICD-10-CM | POA: Diagnosis not present

## 2019-08-03 HISTORY — PX: CATARACT EXTRACTION: SUR2

## 2019-08-20 ENCOUNTER — Telehealth: Payer: Self-pay | Admitting: Family Medicine

## 2019-08-20 NOTE — Telephone Encounter (Signed)
I called the patient to schedule AWV with Courtney, but there was no answer and no option to leave a message. VDM (Dee-Dee) °

## 2019-08-26 ENCOUNTER — Encounter: Payer: Medicare Other | Admitting: Family Medicine

## 2019-10-06 ENCOUNTER — Other Ambulatory Visit: Payer: Self-pay

## 2019-10-06 ENCOUNTER — Ambulatory Visit (INDEPENDENT_AMBULATORY_CARE_PROVIDER_SITE_OTHER): Payer: Medicare Other

## 2019-10-06 VITALS — BP 134/80

## 2019-10-06 DIAGNOSIS — Z0000 Encounter for general adult medical examination without abnormal findings: Secondary | ICD-10-CM | POA: Diagnosis not present

## 2019-10-06 NOTE — Progress Notes (Signed)
I connected with [name] on 10/06/19 at 1250 pm by phone and verified that I am speaking with the correct person using two identifiers. Location patient: Home Location provider: [facility], Office Persons participating in the virtual visit: Denman George LPN and Dr. Garret Reddish    I discussed the limitations of evaluation and management by telemedicine and the availability of in person appointments. The patient expressed understanding and agreed to proceed.  Subjective:   Ethan Payne is a 73 y.o. male who presents for an Initial Medicare Annual Wellness Visit.  Review of Systems   Cardiac Risk Factors include: advanced age (>34men, >65 women);male gender;hypertension;dyslipidemia   Objective:    Today's Vitals   10/06/19 1535  BP: 134/80   There is no height or weight on file to calculate BMI.  Advanced Directives 10/06/2019 04/22/2016  Does Patient Have a Medical Advance Directive? No No  Would patient like information on creating a medical advance directive? No - Patient declined No - patient declined information    Current Medications (verified) Outpatient Encounter Medications as of 10/06/2019  Medication Sig  . amLODipine (NORVASC) 5 MG tablet TAKE 1 TABLET (5 MG TOTAL) BY MOUTH DAILY.  Marland Kitchen aspirin 325 MG tablet Take 325 mg by mouth daily.    Marland Kitchen lisinopril (ZESTRIL) 20 MG tablet TAKE 1 TABLET (20 MG TOTAL) BY MOUTH DAILY.  . rosuvastatin (CRESTOR) 20 MG tablet Take 1 tablet (20 mg total) by mouth daily.  . sildenafil (REVATIO) 20 MG tablet Take 2-5 tablets as needed once every 48 hours for erectile dysfunction   No facility-administered encounter medications on file as of 10/06/2019.     Allergies (verified) Claritin [loratadine] and Hydrocodone   History: Past Medical History:  Diagnosis Date  . Acute prostatitis 02/04/2008  . ATRIAL MYXOMA 02/04/2008  . Closed fracture of four ribs 11/01/2009  . HYPERLIPIDEMIA 02/04/2008  . HYPERTENSION 02/04/2008  .  Osteoarthritis of right knee 04/18/2010   Past Surgical History:  Procedure Laterality Date  . cardio version for a fibi    . CORONARY ARTERY BYPASS GRAFT     2 vessel during atrial myxoma procedure  . removal of atrial myxoma     Family History  Problem Relation Age of Onset  . Stroke Mother   . Heart disease Father        MI 56  . Hypertension Father   . Ovarian cancer Sister    Social History   Socioeconomic History  . Marital status: Married    Spouse name: Not on file  . Number of children: Not on file  . Years of education: Not on file  . Highest education level: Not on file  Occupational History  . Occupation: married  Social Needs  . Financial resource strain: Not on file  . Food insecurity    Worry: Not on file    Inability: Not on file  . Transportation needs    Medical: Not on file    Non-medical: Not on file  Tobacco Use  . Smoking status: Never Smoker  . Smokeless tobacco: Never Used  Substance and Sexual Activity  . Alcohol use: No  . Drug use: No  . Sexual activity: Yes  Lifestyle  . Physical activity    Days per week: Not on file    Minutes per session: Not on file  . Stress: Not on file  Relationships  . Social Herbalist on phone: Not on file    Gets  together: Not on file    Attends religious service: Not on file    Active member of club or organization: Not on file    Attends meetings of clubs or organizations: Not on file    Relationship status: Not on file  Other Topics Concern  . Not on file  Social History Narrative   Married will be 50 years in 2019 (wife seen at another practice). 2 children-daughter unmarried, professional student 32 and son 62 in 2016. 2 grandsons-17 and 45 in 2019.       Still works as Clinical biochemist, does his own carpentry work      Hobbies: fishing   Tobacco Counseling Counseling given: Not Answered   Clinical Intake:  Pre-visit preparation completed: No  Pain : No/denies pain   Diabetes: No  How often do you need to have someone help you when you read instructions, pamphlets, or other written materials from your doctor or pharmacy?: 1 - Never  Interpreter Needed?: No  Information entered by :: Denman George LPN  Activities of Daily Living In your present state of health, do you have any difficulty performing the following activities: 10/06/2019  Hearing? N  Vision? N  Difficulty concentrating or making decisions? N  Walking or climbing stairs? N  Dressing or bathing? N  Doing errands, shopping? N  Preparing Food and eating ? N  Using the Toilet? N  In the past six months, have you accidently leaked urine? N  Do you have problems with loss of bowel control? N  Managing your Medications? N  Managing your Finances? N  Housekeeping or managing your Housekeeping? N  Some recent data might be hidden     Immunizations and Health Maintenance Immunization History  Administered Date(s) Administered  . Influenza Whole 09/01/2009  . Influenza,inj,Quad PF,6+ Mos 01/14/2014  . Pneumococcal Polysaccharide-23 10/03/2009  . Td 05/05/2009  . Tdap 08/01/2014   Health Maintenance Due  Topic Date Due  . INFLUENZA VACCINE  07/03/2019    Patient Care Team: Marin Olp, MD as PCP - General (Family Medicine)  Indicate any recent Medical Services you may have received from other than [facility] providers in the past year (date may be approximate).    Assessment:   This is a routine wellness examination for Ethan Payne.  Hearing/Vision screen No exam data present  Dietary issues and exercise activities discussed: Current Exercise Habits: The patient does not participate in regular exercise at present  Goals   None    Depression Screen PHQ 2/9 Scores 10/06/2019 08/24/2018 08/21/2017 04/22/2016  PHQ - 2 Score 0 0 0 0    Fall Risk Fall Risk  10/06/2019 08/24/2018 08/21/2017 04/22/2016 02/17/2015  Falls in the past year? 0 Yes No No No  Number falls in past yr: - 1 - -  -  Injury with Fall? 0 Yes - - -  Follow up Falls evaluation completed;Education provided;Falls prevention discussed - - - -    Is the patient's home free of loose throw rugs in walkways, pet beds, electrical cords, etc?   yes      Grab bars in the bathroom? yes      Handrails on the stairs?   yes      Adequate lighting?   yes   Cognitive Function: no cognitive concerns at this time    6CIT Screen 10/06/2019  What Year? 0 points  What month? 0 points  What time? 0 points  Count back from 20 0 points  Months in  reverse 0 points  Repeat phrase 0 points  Total Score 0    Screening Tests Health Maintenance  Topic Date Due  . INFLUENZA VACCINE  07/03/2019  . PNA vac Low Risk Adult (1 of 2 - PCV13) 05/25/2020 (Originally 10/22/2013)  . Fecal DNA (Cologuard)  09/08/2020  . TETANUS/TDAP  08/01/2024  . Hepatitis C Screening  Completed    Qualifies for Shingles Vaccine? Discussed and patient will check with pharmacy for coverage.  Patient education handout provided   Cancer Screenings: Lung: Low Dose CT Chest recommended if Age 56-80 years, 30 pack-year currently smoking OR have quit w/in 15years. Patient does not qualify. Colorectal: Cologuard normal 09/09/17    Plan:  I have personally reviewed and addressed the Medicare Annual Wellness questionnaire and have noted the following in the patient's chart:  A. Medical and social history B. Use of alcohol, tobacco or illicit drugs  C. Current medications and supplements D. Functional ability and status E.  Nutritional status F.  Physical activity G. Advance directives H. List of other physicians I.  Hospitalizations, surgeries, and ER visits in previous 12 months J.  [place] such as hearing and vision if needed, cognitive and depression L. Referrals, records requested, and appointments- none   In addition, I have reviewed and discussed with patient certain preventive protocols, quality metrics, and best practice  recommendations. A written personalized care plan for preventive services as well as general preventive health recommendations were provided to patient.   Signed,  Denman George, LPN  Nurse Health Advisor   Nurse Notes: no additional

## 2019-10-06 NOTE — Patient Instructions (Signed)
Ethan Payne , Thank you for taking time to come for your Medicare Wellness Visit. I appreciate your ongoing commitment to your health goals. Please review the following plan we discussed and let me know if I can assist you in the future.   Screening recommendations/referrals: Colorectal Screening: up to date; Cologuard last completed 09/09/17  Vision and Dental Exams: Recommended annual ophthalmology exams for early detection of glaucoma and other disorders of the eye Recommended annual dental exams for proper oral hygiene  Vaccinations: Influenza vaccine:  recommended this fall either at PCP office or through your local pharmacy  Pneumococcal vaccine: recommended Tdap vaccine: up to date; last 08/01/14 Shingles vaccine: Please call your insurance company to determine your out of pocket expense for the Shingrix vaccine. You may receive this vaccine at your local pharmacy.  Goals: Recommend to drink at least 6-8 8oz glasses of water per day and consume a balanced diet rich in fresh fruits and vegetables.   Next appointment: Please schedule your Annual Wellness Visit with your Nurse Health Advisor in one year.  Preventive Care 71 Years and Older, Male Preventive care refers to lifestyle choices and visits with your health care provider that can promote health and wellness. What does preventive care include?  A yearly physical exam. This is also called an annual well check.  Dental exams once or twice a year.  Routine eye exams. Ask your health care provider how often you should have your eyes checked.  Personal lifestyle choices, including:  Daily care of your teeth and gums.  Regular physical activity.  Eating a healthy diet.  Avoiding tobacco and drug use.  Limiting alcohol use.  Practicing safe sex.  Taking low doses of aspirin every day if recommended by your health care provider..  Taking vitamin and mineral supplements as recommended by your health care provider. What  happens during an annual well check? The services and screenings done by your health care provider during your annual well check will depend on your age, overall health, lifestyle risk factors, and family history of disease. Counseling  Your health care provider may ask you questions about your:  Alcohol use.  Tobacco use.  Drug use.  Emotional well-being.  Home and relationship well-being.  Sexual activity.  Eating habits.  History of falls.  Memory and ability to understand (cognition).  Work and work Statistician. Screening  You may have the following tests or measurements:  Height, weight, and BMI.  Blood pressure.  Lipid and cholesterol levels. These may be checked every 5 years, or more frequently if you are over 46 years old.  Skin check.  Lung cancer screening. You may have this screening every year starting at age 78 if you have a 30-pack-year history of smoking and currently smoke or have quit within the past 15 years.  Fecal occult blood test (FOBT) of the stool. You may have this test every year starting at age 68.  Flexible sigmoidoscopy or colonoscopy. You may have a sigmoidoscopy every 5 years or a colonoscopy every 10 years starting at age 43.  Prostate cancer screening. Recommendations will vary depending on your family history and other risks.  Hepatitis C blood test.  Hepatitis B blood test.  Sexually transmitted disease (STD) testing.  Diabetes screening. This is done by checking your blood sugar (glucose) after you have not eaten for a while (fasting). You may have this done every 1-3 years.  Abdominal aortic aneurysm (AAA) screening. You may need this if you are a current  or former smoker.  Osteoporosis. You may be screened starting at age 28 if you are at high risk. Talk with your health care provider about your test results, treatment options, and if necessary, the need for more tests. Vaccines  Your health care provider may recommend  certain vaccines, such as:  Influenza vaccine. This is recommended every year.  Tetanus, diphtheria, and acellular pertussis (Tdap, Td) vaccine. You may need a Td booster every 10 years.  Zoster vaccine. You may need this after age 58.  Pneumococcal 13-valent conjugate (PCV13) vaccine. One dose is recommended after age 33.  Pneumococcal polysaccharide (PPSV23) vaccine. One dose is recommended after age 85. Talk to your health care provider about which screenings and vaccines you need and how often you need them. This information is not intended to replace advice given to you by your health care provider. Make sure you discuss any questions you have with your health care provider. Document Released: 12/15/2015 Document Revised: 08/07/2016 Document Reviewed: 09/19/2015 Elsevier Interactive Patient Education  2017 [place] Prevention in the Home Falls can cause injuries. They can happen to people of all ages. There are many things you can do to make your home safe and to help prevent falls. What can I do on the outside of my home?  Regularly fix the edges of walkways and driveways and fix any cracks.  Remove anything that might make you trip as you walk through a door, such as a raised step or threshold.  Trim any bushes or trees on the path to your home.  Use bright outdoor lighting.  Clear any walking paths of anything that might make someone trip, such as rocks or tools.  Regularly check to see if handrails are loose or broken. Make sure that both sides of any steps have handrails.  Any raised decks and porches should have guardrails on the edges.  Have any leaves, snow, or ice cleared regularly.  Use sand or salt on walking paths during winter.  Clean up any spills in your garage right away. This includes oil or grease spills. What can I do in the bathroom?  Use night lights.  Install grab bars by the toilet and in the tub and shower. Do not use towel bars as  grab bars.  Use non-skid mats or decals in the tub or shower.  If you need to sit down in the shower, use a plastic, non-slip stool.  Keep the floor dry. Clean up any water that spills on the floor as soon as it happens.  Remove soap buildup in the tub or shower regularly.  Attach bath mats securely with double-sided non-slip rug tape.  Do not have throw rugs and other things on the floor that can make you trip. What can I do in the bedroom?  Use night lights.  Make sure that you have a light by your bed that is easy to reach.  Do not use any sheets or blankets that are too big for your bed. They should not hang down onto the floor.  Have a firm chair that has side arms. You can use this for support while you get dressed.  Do not have throw rugs and other things on the floor that can make you trip. What can I do in the kitchen?  Clean up any spills right away.  Avoid walking on wet floors.  Keep items that you use a lot in easy-to-reach places.  If you need to reach something above you,  use a strong step stool that has a grab bar.  Keep electrical cords out of the way.  Do not use floor polish or wax that makes floors slippery. If you must use wax, use non-skid floor wax.  Do not have throw rugs and other things on the floor that can make you trip. What can I do with my stairs?  Do not leave any items on the stairs.  Make sure that there are handrails on both sides of the stairs and use them. Fix handrails that are broken or loose. Make sure that handrails are as long as the stairways.  Check any carpeting to make sure that it is firmly attached to the stairs. Fix any carpet that is loose or worn.  Avoid having throw rugs at the top or bottom of the stairs. If you do have throw rugs, attach them to the floor with carpet tape.  Make sure that you have a light switch at the top of the stairs and the bottom of the stairs. If you do not have them, ask someone to add them  for you. What else can I do to help prevent falls?  Wear shoes that:  Do not have high heels.  Have rubber bottoms.  Are comfortable and fit you well.  Are closed at the toe. Do not wear sandals.  If you use a stepladder:  Make sure that it is fully opened. Do not climb a closed stepladder.  Make sure that both sides of the stepladder are locked into place.  Ask someone to hold it for you, if possible.  Clearly mark and make sure that you can see:  Any grab bars or handrails.  First and last steps.  Where the edge of each step is.  Use tools that help you move around (mobility aids) if they are needed. These include:  Canes.  Walkers.  Scooters.  Crutches.  Turn on the lights when you go into a dark area. Replace any light bulbs as soon as they burn out.  Set up your furniture so you have a clear path. Avoid moving your furniture around.  If any of your floors are uneven, fix them.  If there are any pets around you, be aware of where they are.  Review your medicines with your doctor. Some medicines can make you feel dizzy. This can increase your chance of falling. Ask your doctor what other things that you can do to help prevent falls. This information is not intended to replace advice given to you by your health care provider. Make sure you discuss any questions you have with your health care provider. Document Released: 09/14/2009 Document Revised: 04/25/2016 Document Reviewed: 12/23/2014 Elsevier Interactive Patient Education  2017 [employer].

## 2019-10-07 NOTE — Progress Notes (Signed)
I have reviewed and agree with note, evaluation, plan.   In health maintenance it still says he needs a flu shot? But appears he had one- can someone please investigate this further? Immunization History  Administered Date(s) Administered  . Influenza Whole 09/01/2009  . Influenza,inj,Quad PF,6+ Mos 01/14/2014  . Pneumococcal Polysaccharide-23 10/03/2009  . Td 05/05/2009  . Tdap 08/01/2014     Garret Reddish, MD

## 2019-10-07 NOTE — Progress Notes (Signed)
Called and spoke with pt and he states he is not planning on taking a flu shot this year.

## 2019-12-23 DIAGNOSIS — H43813 Vitreous degeneration, bilateral: Secondary | ICD-10-CM | POA: Diagnosis not present

## 2020-01-13 DIAGNOSIS — H43813 Vitreous degeneration, bilateral: Secondary | ICD-10-CM | POA: Diagnosis not present

## 2020-01-18 NOTE — Progress Notes (Signed)
Called pt and LVM to schedule follow up appt.

## 2020-02-15 ENCOUNTER — Other Ambulatory Visit: Payer: Self-pay

## 2020-02-15 ENCOUNTER — Encounter: Payer: Self-pay | Admitting: Family Medicine

## 2020-02-15 ENCOUNTER — Ambulatory Visit (INDEPENDENT_AMBULATORY_CARE_PROVIDER_SITE_OTHER): Payer: Medicare Other | Admitting: Family Medicine

## 2020-02-15 VITALS — BP 140/78 | HR 66 | Temp 97.3°F | Ht 69.0 in | Wt 174.6 lb

## 2020-02-15 DIAGNOSIS — I10 Essential (primary) hypertension: Secondary | ICD-10-CM | POA: Diagnosis not present

## 2020-02-15 DIAGNOSIS — E785 Hyperlipidemia, unspecified: Secondary | ICD-10-CM

## 2020-02-15 DIAGNOSIS — N401 Enlarged prostate with lower urinary tract symptoms: Secondary | ICD-10-CM

## 2020-02-15 DIAGNOSIS — R972 Elevated prostate specific antigen [PSA]: Secondary | ICD-10-CM

## 2020-02-15 DIAGNOSIS — I2510 Atherosclerotic heart disease of native coronary artery without angina pectoris: Secondary | ICD-10-CM | POA: Diagnosis not present

## 2020-02-15 DIAGNOSIS — R351 Nocturia: Secondary | ICD-10-CM

## 2020-02-15 LAB — COMPREHENSIVE METABOLIC PANEL
ALT: 18 U/L (ref 0–53)
AST: 19 U/L (ref 0–37)
Albumin: 4.3 g/dL (ref 3.5–5.2)
Alkaline Phosphatase: 44 U/L (ref 39–117)
BUN: 14 mg/dL (ref 6–23)
CO2: 30 mEq/L (ref 19–32)
Calcium: 10.2 mg/dL (ref 8.4–10.5)
Chloride: 101 mEq/L (ref 96–112)
Creatinine, Ser: 1.02 mg/dL (ref 0.40–1.50)
GFR: 71.93 mL/min (ref >60.00)
Glucose, Bld: 98 mg/dL (ref 70–99)
Potassium: 4.7 mEq/L (ref 3.5–5.1)
Sodium: 136 mEq/L (ref 135–145)
Total Bilirubin: 0.9 mg/dL (ref 0.2–1.2)
Total Protein: 7.4 g/dL (ref 6.0–8.3)

## 2020-02-15 LAB — PSA: PSA: 3.07 ng/mL (ref 0.10–4.00)

## 2020-02-15 LAB — LDL CHOLESTEROL, DIRECT: Direct LDL: 103 mg/dL

## 2020-02-15 MED ORDER — ROSUVASTATIN CALCIUM 20 MG PO TABS: 20.0000 mg | ORAL_TABLET | Freq: Every day | ORAL | 3 refills | Status: DC

## 2020-02-15 MED ORDER — AMLODIPINE BESYLATE 5 MG PO TABS: ORAL_TABLET | ORAL | 3 refills | Status: DC

## 2020-02-15 MED ORDER — SILDENAFIL CITRATE 100 MG PO TABS: 100.0000 mg | ORAL_TABLET | Freq: Every day | ORAL | 5 refills | Status: DC | PRN

## 2020-02-15 MED ORDER — LISINOPRIL 20 MG PO TABS: ORAL_TABLET | ORAL | 3 refills | Status: DC

## 2020-02-15 NOTE — Patient Instructions (Addendum)
Blood pressure slightly high today but you are out of amlodipine- we refilled this and lets check back in for physical in 6 months and recheck in person  As long as home #s stay in 130s/80s  Try only a half tablet of sildenafil/viagra to start. Price shop this medicine and can ask them to run goodrx on it as well  Please stop by lab before you go If you do not have mychart- we will call you about results within 5 business days of [place] receiving them.  If you have mychart- we will send your results within 3 business days of [place] receiving them.  If abnormal or we want to clarify a result, we will call or mychart you to make sure you receive the message.  If you have questions or concerns or don't hear within 5 business days, please send [place] a message or call us.   Recommended follow up: Return in about 6 months (around 08/17/2020) for physical or sooner if needed.

## 2020-02-15 NOTE — Progress Notes (Signed)
Phone [phone] In person visit   Subjective:   Ethan Payne is a 74 y.o. year old very pleasant male patient who presents for/with See problem oriented charting Chief Complaint  Patient presents with  . Elevated PSA   This visit occurred during the SARS-CoV-2 public health emergency.  Safety protocols were in place, including screening questions prior to the visit, additional usage of staff PPE, and extensive cleaning of exam room while observing appropriate contact time as indicated for disinfecting solutions.   Past Medical History-  Patient Active Problem List   Diagnosis Date Noted  . CAD (coronary artery disease) 02/17/2015    Priority: High  . s/p excision of Atrial myxoma 02/04/2008    Priority: High  . BPH associated with nocturia 04/10/2015    Priority: Medium  . Hyperlipidemia 02/04/2008    Priority: Medium  . Essential hypertension 02/04/2008    Priority: Medium  . Health care maintenance 02/18/2015    Priority: Low  . Erectile dysfunction 02/18/2015    Priority: Low  . Heel pain 03/22/2011    Priority: Low  . GERD (gastroesophageal reflux disease) 03/22/2011    Priority: Low  . Osteoarthritis of right knee 04/18/2010    Priority: Low  . Leg length difference, acquired 01/07/2018  . Rotator cuff syndrome of right shoulder 12/08/2017    Medications- reviewed and updated Current Outpatient Medications  Medication Sig Dispense Refill  . amLODipine (NORVASC) 5 MG tablet TAKE 1 TABLET (5 MG TOTAL) BY MOUTH DAILY. 90 tablet 3  . aspirin 325 MG tablet Take 325 mg by mouth daily.      Marland Kitchen lisinopril (ZESTRIL) 20 MG tablet TAKE 1 TABLET (20 MG TOTAL) BY MOUTH DAILY. 90 tablet 3  . rosuvastatin (CRESTOR) 20 MG tablet Take 1 tablet (20 mg total) by mouth daily. 90 tablet 3  . sildenafil (VIAGRA) 100 MG tablet Take 1 tablet (100 mg total) by mouth daily as needed for erectile dysfunction. 10 tablet 5   No current facility-administered medications for this visit.      Objective:  BP 140/78   Pulse 66   Temp (!) 97.3 F (36.3 C) (Temporal)   Ht 5\' 9"  (1.753 m)   Wt 174 lb 9.6 oz (79.2 kg)   SpO2 94%   BMI 25.78 kg/m  Gen: NAD, resting comfortably CV: RRR no murmurs rubs or gallops Lungs: CTAB no crackles, wheeze, rhonchi Ext: no edema Skin: warm, dry    Assessment and Plan    # HM-Strongly encouraged covid 19 vaccine- he still has some concerns and wants to wait for now   # hypertension S:controlled on amlodipine 5 mg and lisinopril 20mg  at home-mild poor control today as ran out of amlodipine about a week ago. Patient has been checking at home about every two weeks. Average has been in 130/80's before he ran out of amlodipine. Patient denies any chest pain, shortness of breath, changes or changes in vision. Does not add salt to food. Tries to maintain a heart healthy diet.  A/P: Blood pressure is well controlled at home-mild elevation today is off amlodipine aspirin a week ago-continue current medications and refilled amlodipine    # hyperlipidemia/CAD S: Hopefully controlled on crestor 20mg .  Also compliant with aspirin 325  Asymptomatic from CAD perspective-patient with two-vessel bypass primarily done due to already having open heart surgery for atrial myxoma Lab Results  Component Value Date   CHOL 185 05/26/2019   HDL 48.10 05/26/2019   LDLCALC 117 (H) 05/26/2019  LDLDIRECT 203.9 10/07/2011   TRIG 101.0 05/26/2019   CHOLHDL 4 05/26/2019   A/P: Lipids hopefully controlled-update direct LDL today-at least LDL under 100 but would prefer under 70  CAD remains asymptomatic-continue statin and aspirin  # elevated PSA  S:does not want to return to prior urologist unless drastically increased risk-he simply did not feel like it was the best fit.  Last seen November 2019 with Dr. Alton Revere biopsy was offered but patient opted for PSA surveillance and plan had been 57-month repeat Lab Results  Component Value Date   PSA 4.08  (H) 10/09/2017   PSA 3.89 08/21/2017   PSA 2.57 04/22/2016  A/P: Prior elevated PSA-no numbers since 2018 in our chart-update PSA today and if trending up refer back to urology per patient request a new provider -Patient does have known BPH and that could be the cause  #Erectile dysfunction-had trouble getting sildenafil 20 mg filled-we discussed generic version of Viagra now available with 50 and 100 mg tablets-we will try to 100 mg tablet but encouraged him to start with just 1/2 tablet  Please note prescriptions were printed as patient is not sure about what pharmacy is covered and what cost will be.  We discussed trying good Rx for sildenafil  Recommended follow up: Return in about 6 months (around 08/17/2020) for physical or sooner if needed.  Lab/Order associations:   ICD-10-CM   1. Hyperlipidemia, unspecified hyperlipidemia type  E78.5 Comprehensive metabolic panel    LDL cholesterol, direct  2. Elevated PSA  R97.20 PSA  3. Essential hypertension  I10   4. Coronary artery disease involving native coronary artery of native heart without angina pectoris  I25.10   5. BPH associated with nocturia  N40.1    R35.1     Meds ordered this encounter  Medications  . amLODipine (NORVASC) 5 MG tablet    Sig: TAKE 1 TABLET (5 MG TOTAL) BY MOUTH DAILY.    Dispense:  90 tablet    Refill:  3  . lisinopril (ZESTRIL) 20 MG tablet    Sig: TAKE 1 TABLET (20 MG TOTAL) BY MOUTH DAILY.    Dispense:  90 tablet    Refill:  3  . rosuvastatin (CRESTOR) 20 MG tablet    Sig: Take 1 tablet (20 mg total) by mouth daily.    Dispense:  90 tablet    Refill:  3  . DISCONTD: sildenafil (VIAGRA) 100 MG tablet    Sig: Take 1 tablet (100 mg total) by mouth daily as needed for erectile dysfunction.    Dispense:  10 tablet    Refill:  5  . sildenafil (VIAGRA) 100 MG tablet    Sig: Take 1 tablet (100 mg total) by mouth daily as needed for erectile dysfunction.    Dispense:  10 tablet    Refill:  5   Return  precautions advised.  Garret Reddish, MD

## 2020-11-07 ENCOUNTER — Telehealth: Payer: Self-pay | Admitting: Family Medicine

## 2020-11-07 NOTE — Telephone Encounter (Signed)
Left message for patient to call back and schedule Medicare Annual Wellness Visit (AWV) either virtually OR in office.   Last AWV 10/06/19; please schedule at anytime with [facility]-Nurse Health Advisor at [facility].  This should be a 45 minute visit.

## 2021-01-12 DIAGNOSIS — H26491 Other secondary cataract, right eye: Secondary | ICD-10-CM | POA: Diagnosis not present

## 2021-01-22 DIAGNOSIS — L821 Other seborrheic keratosis: Secondary | ICD-10-CM | POA: Diagnosis not present

## 2021-01-22 DIAGNOSIS — L570 Actinic keratosis: Secondary | ICD-10-CM | POA: Diagnosis not present

## 2021-01-22 DIAGNOSIS — C44529 Squamous cell carcinoma of skin of other part of trunk: Secondary | ICD-10-CM | POA: Diagnosis not present

## 2021-01-22 DIAGNOSIS — L819 Disorder of pigmentation, unspecified: Secondary | ICD-10-CM | POA: Diagnosis not present

## 2021-03-28 DIAGNOSIS — L812 Freckles: Secondary | ICD-10-CM | POA: Diagnosis not present

## 2021-03-28 DIAGNOSIS — L814 Other melanin hyperpigmentation: Secondary | ICD-10-CM | POA: Diagnosis not present

## 2021-03-28 DIAGNOSIS — L905 Scar conditions and fibrosis of skin: Secondary | ICD-10-CM | POA: Diagnosis not present

## 2021-03-28 DIAGNOSIS — L819 Disorder of pigmentation, unspecified: Secondary | ICD-10-CM | POA: Diagnosis not present

## 2021-04-26 NOTE — Patient Instructions (Addendum)
Health Maintenance Due  Topic Date Due  . COVID-19 Vaccine - opts out vaccine Never done  . Zoster Vaccines- Shingrix (1 of 2). Please check with your pharmacy to see if they have the shingrix vaccine. If they do- please get this immunization and update [place] by phone call or mychart with dates you receive the vaccine Never done  . PNA vac Low Risk Adult - opts out of pneumonia shot - reports had a lot of swelling with prior shot 10/22/2013   Please stop by lab before you go If you have mychart- we will send your results within 3 business days of [place] receiving them.  If you do not have mychart- we will call you about results within 5 business days of [place] receiving them.  *please also note that you will see labs on mychart as soon as they post. I will later go in and write notes on them- will say "notes from Dr. Yong Channel"   Blood pressure has increased in the office-patient reports some anxiety today about hernia.  We opted to add amlodipine back at a lower dose 2.5 mg-he will let me know if he has any lightheadedness with this.  We could potentially even cut the 2.5 mg in half.  I would like home blood pressure to average less than 135/85. Continue lisinopril 20 mg  Team ekg before he goes to update for chart- as long as this is stable can clear you for surgery if needed

## 2021-04-26 NOTE — Progress Notes (Signed)
Phone [phone] In person visit   Subjective:   Ethan Payne is a 75 y.o. year old very pleasant male patient who presents for/with See problem oriented charting Chief Complaint  Patient presents with  . Cyst    Pt c/o knot left groin area x 3 months, some pain. Taking Advil with relief.   This visit occurred during the SARS-CoV-2 public health emergency.  Safety protocols were in place, including screening questions prior to the visit, additional usage of staff PPE, and extensive cleaning of exam room while observing appropriate contact time as indicated for disinfecting solutions.   Past Medical History-  Patient Active Problem List   Diagnosis Date Noted  . CAD (coronary artery disease) 02/17/2015    Priority: High  . s/p excision of Atrial myxoma 02/04/2008    Priority: High  . BPH associated with nocturia 04/10/2015    Priority: Medium  . Hyperlipidemia 02/04/2008    Priority: Medium  . Essential hypertension 02/04/2008    Priority: Medium  . Leg length difference, acquired 01/07/2018    Priority: Low  . Rotator cuff syndrome of right shoulder 12/08/2017    Priority: Low  . Health care maintenance 02/18/2015    Priority: Low  . Erectile dysfunction 02/18/2015    Priority: Low  . Heel pain 03/22/2011    Priority: Low  . GERD (gastroesophageal reflux disease) 03/22/2011    Priority: Low  . Osteoarthritis of right knee 04/18/2010    Priority: Low    Medications- reviewed and updated Current Outpatient Medications  Medication Sig Dispense Refill  . amLODipine (NORVASC) 2.5 MG tablet Take 1 tablet (2.5 mg total) by mouth daily. 90 tablet 3  . aspirin 325 MG tablet Take 325 mg by mouth daily.    Marland Kitchen lisinopril (ZESTRIL) 20 MG tablet TAKE 1 TABLET (20 MG TOTAL) BY MOUTH DAILY. 90 tablet 3  . Multiple Vitamin (MULTIVITAMIN) tablet Take 1 tablet by mouth daily.    . rosuvastatin (CRESTOR) 20 MG tablet Take 1 tablet (20 mg total) by mouth daily. 90 tablet 3  . Zinc  50 MG TABS Take 1 tablet by mouth daily in the afternoon.     No current facility-administered medications for this visit.     Objective:  BP (!) 150/90 (BP Location: Left Arm, Patient Position: Sitting, Cuff Size: Normal)   Pulse 80   Temp 97.8 F (36.6 C) (Temporal)   Ht 5\' 9"  (1.753 m)   Wt 169 lb 8 oz (76.9 kg)   SpO2 97%   BMI 25.03 kg/m  Gen: NAD, resting comfortably CV: RRR no murmurs rubs or gallops Lungs: CTAB no crackles, wheeze, rhonchi Abdomen: soft/nontender/nondistended/normal bowel sounds.  Left groin: Prominent left groin worse with Valsalva-mildly tender Ext: no edema Skin: warm, dry  EKG: sinus rhythm with rate 73, normal axis, normal intervals, no hypertrophy, RSR noted in v1 and unchanged from 08/21/17, no st or t wave changes- overall stable EKG from 08/21/17     Assessment and Plan   Painful Knot in groin S:patient has noted a bulge in his left groin around 3 months ago. Worse when he strains. Sometimes will go back down . Has to take aleve/advil at times A/P: Left groin hernia-considering patient is having take Aleve/Advil at times I think it is worth referring to surgery for their opinion.  Referral was placed today.   - patient with CAD history but had bypass primarily due to having atrial myxoma surgery. He has not seen cardiology in years.  He was not told he needed no further follow up/released in 2009 from Dr. Lovena Le.  We will update EKG but he states easily can climb 2 flights of stairs without chest pain or shortness of breath-since he can complete 4 METS will not require further work-up- I would like in office BP to be better though- see HTN section though and advised 1 month follow up (unless BP were in range at surgery office)  #CAD #hyperlipidemia S: Medication:aspirin 325 mg, rosuvastatin 20mg  - daily Lab Results  Component Value Date   CHOL 185 05/26/2019   HDL 48.10 05/26/2019   LDLCALC 117 (H) 05/26/2019   LDLDIRECT 103.0 02/15/2020    TRIG 101.0 05/26/2019   CHOLHDL 4 05/26/2019   A/P:  Last year lipids slightly high- had encouraged possible increase to 40 mg- we will do that if remains high. Continue aspirin/statin  #hypertension S: medication: lisinopril 20 mg. He stopped amlodipine 5 mg as multiple attempts caused lightheadedness.  Home readings #s: home BP was 130s/85 for most part  BP Readings from Last 3 Encounters:  04/27/21 (!) 150/90  02/15/20 140/78  10/06/19 134/80  A/P: Blood pressure has increased in the office-patient reports some anxiety today about hernia.  We opted to add amlodipine back at a lower dose 2.5 mg-he will let me know if he has any lightheadedness with this.  We could potentially even cut the 2.5 mg in half.  I would like home blood pressure to average less than 135/85. Continue lisinopril 20 mg  Recommended follow up: Return in about 1 month (around 05/28/2021) for follow up- or sooner if needed.  Lab/Order associations:   ICD-10-CM   1. Left groin hernia  K40.90   2. Hyperlipidemia, unspecified hyperlipidemia type  E78.5 CBC with Differential/Platelet    Comprehensive metabolic panel    Lipid panel  3. Essential hypertension  I10 CBC with Differential/Platelet    Comprehensive metabolic panel    Lipid panel  4. Coronary artery disease involving native coronary artery of native heart without angina pectoris  I25.10 EKG 12-Lead  5. Screen for colon cancer  Z12.11 Cologuard  6. Elevated PSA  R97.20 PSA    Meds ordered this encounter  Medications  . amLODipine (NORVASC) 2.5 MG tablet    Sig: Take 1 tablet (2.5 mg total) by mouth daily.    Dispense:  90 tablet    Refill:  3   Return precautions advised.  Garret Reddish, MD

## 2021-04-27 ENCOUNTER — Other Ambulatory Visit: Payer: Self-pay

## 2021-04-27 ENCOUNTER — Encounter: Payer: Self-pay | Admitting: Family Medicine

## 2021-04-27 ENCOUNTER — Ambulatory Visit (INDEPENDENT_AMBULATORY_CARE_PROVIDER_SITE_OTHER): Payer: Medicare Other | Admitting: Family Medicine

## 2021-04-27 VITALS — BP 150/90 | HR 80 | Temp 97.8°F | Ht 69.0 in | Wt 169.5 lb

## 2021-04-27 DIAGNOSIS — R972 Elevated prostate specific antigen [PSA]: Secondary | ICD-10-CM | POA: Diagnosis not present

## 2021-04-27 DIAGNOSIS — I2510 Atherosclerotic heart disease of native coronary artery without angina pectoris: Secondary | ICD-10-CM | POA: Diagnosis not present

## 2021-04-27 DIAGNOSIS — K4090 Unilateral inguinal hernia, without obstruction or gangrene, not specified as recurrent: Secondary | ICD-10-CM | POA: Diagnosis not present

## 2021-04-27 DIAGNOSIS — Z1211 Encounter for screening for malignant neoplasm of colon: Secondary | ICD-10-CM

## 2021-04-27 DIAGNOSIS — I10 Essential (primary) hypertension: Secondary | ICD-10-CM

## 2021-04-27 DIAGNOSIS — E785 Hyperlipidemia, unspecified: Secondary | ICD-10-CM

## 2021-04-27 LAB — COMPREHENSIVE METABOLIC PANEL
ALT: 24 U/L (ref 0–53)
AST: 23 U/L (ref 0–37)
Albumin: 4.6 g/dL (ref 3.5–5.2)
Alkaline Phosphatase: 40 U/L (ref 39–117)
BUN: 15 mg/dL (ref 6–23)
CO2: 32 mEq/L (ref 19–32)
Calcium: 9.8 mg/dL (ref 8.4–10.5)
Chloride: 101 mEq/L (ref 96–112)
Creatinine, Ser: 1.03 mg/dL (ref 0.40–1.50)
GFR: 72.57 mL/min (ref >60.00)
Glucose, Bld: 110 mg/dL — ABNORMAL HIGH (ref 70–99)
Potassium: 3.9 mEq/L (ref 3.5–5.1)
Sodium: 138 mEq/L (ref 135–145)
Total Bilirubin: 0.7 mg/dL (ref 0.2–1.2)
Total Protein: 7 g/dL (ref 6.0–8.3)

## 2021-04-27 LAB — LIPID PANEL
Cholesterol: 175 mg/dL (ref 0–200)
HDL: 50.8 mg/dL (ref >39.00)
LDL Cholesterol: 110 mg/dL — ABNORMAL HIGH (ref 0–99)
NonHDL: 124.58
Total CHOL/HDL Ratio: 3
Triglycerides: 74 mg/dL (ref 0.0–149.0)
VLDL: 14.8 mg/dL (ref 0.0–40.0)

## 2021-04-27 LAB — CBC WITH DIFFERENTIAL/PLATELET
Basophils Absolute: 0 10*3/uL (ref 0.0–0.1)
Basophils Relative: 0.5 % (ref 0.0–3.0)
Eosinophils Absolute: 0.6 10*3/uL (ref 0.0–0.7)
Eosinophils Relative: 10.1 % — ABNORMAL HIGH (ref 0.0–5.0)
HCT: 42.6 % (ref 39.0–52.0)
Hemoglobin: 14.8 g/dL (ref 13.0–17.0)
Lymphocytes Relative: 28 % (ref 12.0–46.0)
Lymphs Abs: 1.6 10*3/uL (ref 0.7–4.0)
MCHC: 34.8 g/dL (ref 30.0–36.0)
MCV: 89 fl (ref 78.0–100.0)
Monocytes Absolute: 0.5 10*3/uL (ref 0.1–1.0)
Monocytes Relative: 8.5 % (ref 3.0–12.0)
Neutro Abs: 3.1 10*3/uL (ref 1.4–7.7)
Neutrophils Relative %: 52.9 % (ref 43.0–77.0)
Platelets: 227 10*3/uL (ref 150.0–400.0)
RBC: 4.78 Mil/uL (ref 4.22–5.81)
RDW: 13.2 % (ref 11.5–15.5)
WBC: 5.8 10*3/uL (ref 4.0–10.5)

## 2021-04-27 LAB — PSA: PSA: 4.19 ng/mL — ABNORMAL HIGH (ref 0.10–4.00)

## 2021-04-27 MED ORDER — AMLODIPINE BESYLATE 2.5 MG PO TABS: 2.5000 mg | ORAL_TABLET | Freq: Every day | ORAL | 3 refills | Status: DC

## 2021-05-01 ENCOUNTER — Other Ambulatory Visit: Payer: Self-pay

## 2021-05-01 MED ORDER — ROSUVASTATIN CALCIUM 40 MG PO TABS
40.0000 mg | ORAL_TABLET | Freq: Every day | ORAL | 3 refills | Status: DC
Start: 2021-05-01 — End: 2021-06-08

## 2021-05-03 ENCOUNTER — Other Ambulatory Visit: Payer: Self-pay

## 2021-05-03 DIAGNOSIS — K4090 Unilateral inguinal hernia, without obstruction or gangrene, not specified as recurrent: Secondary | ICD-10-CM

## 2021-05-16 DIAGNOSIS — Z1211 Encounter for screening for malignant neoplasm of colon: Secondary | ICD-10-CM | POA: Diagnosis not present

## 2021-05-23 LAB — COLOGUARD
COLOGUARD: NEGATIVE
Cologuard: NEGATIVE

## 2021-05-24 ENCOUNTER — Encounter: Payer: Self-pay | Admitting: Family Medicine

## 2021-06-07 DIAGNOSIS — N402 Nodular prostate without lower urinary tract symptoms: Secondary | ICD-10-CM | POA: Diagnosis not present

## 2021-06-07 DIAGNOSIS — R972 Elevated prostate specific antigen [PSA]: Secondary | ICD-10-CM | POA: Diagnosis not present

## 2021-06-07 DIAGNOSIS — N5201 Erectile dysfunction due to arterial insufficiency: Secondary | ICD-10-CM | POA: Diagnosis not present

## 2021-06-08 ENCOUNTER — Telehealth: Payer: Self-pay

## 2021-06-08 MED ORDER — LISINOPRIL 20 MG PO TABS: ORAL_TABLET | ORAL | 3 refills | Status: DC

## 2021-06-08 MED ORDER — ROSUVASTATIN CALCIUM 40 MG PO TABS: 40.0000 mg | ORAL_TABLET | Freq: Every day | ORAL | 3 refills | Status: DC

## 2021-06-08 NOTE — Telephone Encounter (Signed)
  LAST APPOINTMENT DATE: 04/27/21  NEXT APPOINTMENT DATE:@Visit  date not found  MEDICATION: lisinopril (ZESTRIL) 20 MG tablet // rosuvastatin (CRESTOR) 40 MG tablet//   PHARMACY: [id number] - [place], [place] - [address] AT [facility]  COMMENTS: Patient is completely out.

## 2021-06-08 NOTE — Telephone Encounter (Signed)
Rx sent to pharmacy   

## 2021-07-19 DIAGNOSIS — N402 Nodular prostate without lower urinary tract symptoms: Secondary | ICD-10-CM | POA: Diagnosis not present

## 2021-07-19 DIAGNOSIS — R972 Elevated prostate specific antigen [PSA]: Secondary | ICD-10-CM | POA: Diagnosis not present

## 2021-07-24 ENCOUNTER — Encounter: Payer: Self-pay | Admitting: Surgery

## 2021-07-24 ENCOUNTER — Telehealth: Payer: Self-pay

## 2021-07-24 ENCOUNTER — Ambulatory Visit: Payer: Self-pay | Admitting: Surgery

## 2021-07-24 DIAGNOSIS — I2510 Atherosclerotic heart disease of native coronary artery without angina pectoris: Secondary | ICD-10-CM | POA: Diagnosis not present

## 2021-07-24 DIAGNOSIS — R972 Elevated prostate specific antigen [PSA]: Secondary | ICD-10-CM | POA: Diagnosis not present

## 2021-07-24 DIAGNOSIS — K4090 Unilateral inguinal hernia, without obstruction or gangrene, not specified as recurrent: Secondary | ICD-10-CM | POA: Diagnosis not present

## 2021-07-24 DIAGNOSIS — H9190 Unspecified hearing loss, unspecified ear: Secondary | ICD-10-CM | POA: Diagnosis not present

## 2021-07-24 NOTE — Telephone Encounter (Signed)
Referral notes received from [facility], Phone #: [phone], Fax #: [phone]   A copy of the notes have been placed in the scheduling box for check-out to pick-up and to enter referral. Original notes placed in file cabinet.

## 2021-07-30 ENCOUNTER — Telehealth: Payer: Self-pay | Admitting: *Deleted

## 2021-07-30 NOTE — Telephone Encounter (Signed)
I will place a call out to [facility] and ask for a pre op clearance request to be faxed to our office as well. Will send message to EP scheduler to please arrange a new pt appt with EP MD. Previously seen by Dr. Lovena Le though can see any EP MD at this.

## 2021-07-30 NOTE — Telephone Encounter (Signed)
Telephone 07/24/2021 [facility] [address] [place], Ronny Bacon, [place] (Newest Message First) July 30, 2021      2:06 PM You routed this conversation to Cv Div Preop Callback  Me     8:36 AM Note I will place a call out to [facility] and ask for a pre op clearance request to be faxed to our office as well. Will send message to EP scheduler to please arrange a new pt appt with EP MD. Previously seen by Dr. Lovena Le though can see any EP MD at this.      Sande Rives E, PA-C to Cv Div Preop Callback     7:50 AM Sounds like a pre-op clearance form was sent over but I cannot see it. Can you see if you can find it and get it scanned it? It also sounds like this patient is going to need a New Patient Visit with MD for prior to visit.   Thanks so much!  [facility]  July 27, 2021      6:21 AM Damian Leavell, RN routed this conversation to Cv Div Preop  July 26, 2021 Evans Lance, MD to Damian Leavell, RN     9:51 PM He needs to be scheduled for a preoperative eval prior to surgery. I have not seen him in 13 years. If someone has an earlier available than I do then he could be seen by someone else. GT  July 24, 2021 Jacinta Shoe, CMA     5:03 PM Note Referral notes received from [facility], Phone #: [phone], Fax #: [phone]    A copy of the notes have been placed in the scheduling box for check-out to pick-up and to enter referral. Original notes placed in file cabinet.

## 2021-07-31 NOTE — Telephone Encounter (Signed)
Will send FYI note to requesting office pt will need NEW PT APPT for pre op clearance; pt last seen 13 yrs ago by cardiology.

## 2021-07-31 NOTE — Telephone Encounter (Signed)
   [facility] Pre-operative Risk Assessment    Patient Name: Ethan Payne  DOB: [birth year] MRN: [id number]  HEARTCARE STAFF:  - IMPORTANT!!!!!! Under Visit Info/Reason for Call, type in Other and utilize the format Clearance MM/DD/YY or Clearance TBD. Do not use dashes or single digits. - Please review there is not already an duplicate clearance open for this procedure. - If request is for dental extraction, please clarify the # of teeth to be extracted. - If the patient is currently at the dentist's office, call Pre-Op Callback Staff (MA/nurse) to input urgent request.  - If the patient is not currently in the dentist office, please route to the Pre-Op pool.  Request for surgical clearance: PT LAST SEEN BY CARDIOLOGY 13 YEARS AGO; PT WILL NEED NEW PT APPT. WILL SEND MESSAGE TO EP SCHEDULER TO SET UP NEW PT APPT FOR PRE OP CLEARANCE  What type of surgery is being performed? B/L INGUINAL HERNIA w/MESH  When is this surgery scheduled? TBD (PT INTERESTED IN HAVING ASAP)  What type of clearance is required (medical clearance vs. Pharmacy clearance to hold med vs. Both)? MEDICAL  Are there any medications that need to be held prior to surgery and how long?  ASA   Practice name and name of physician performing surgery? [facility]; SURGEON NOT LISTED  What is the office phone number? [phone]   7.   What is the office fax number? [place], CMA  8.   Anesthesia type (None, local, MAC, general) ? GENERAL   Julaine Hua 07/31/2021, 9:31 AM  _________________________________________________________________   (provider comments below)

## 2021-08-01 NOTE — Telephone Encounter (Signed)
Pt has been scheduled to see Dr. Lovena Le 09/18/21 for pre op clearance. I will send FYI to surgeon's office pt has NEW PT appt 09/18/21.

## 2021-08-13 NOTE — Telephone Encounter (Signed)
Agree. GT 

## 2021-09-07 ENCOUNTER — Ambulatory Visit: Payer: Medicare Other | Admitting: Internal Medicine

## 2021-09-07 ENCOUNTER — Encounter: Payer: Self-pay | Admitting: Internal Medicine

## 2021-09-07 ENCOUNTER — Other Ambulatory Visit: Payer: Self-pay

## 2021-09-07 VITALS — BP 148/92 | HR 69 | Ht 70.0 in | Wt 169.4 lb

## 2021-09-07 DIAGNOSIS — I10 Essential (primary) hypertension: Secondary | ICD-10-CM

## 2021-09-07 DIAGNOSIS — I2510 Atherosclerotic heart disease of native coronary artery without angina pectoris: Secondary | ICD-10-CM

## 2021-09-07 NOTE — Patient Instructions (Addendum)
Medication Instructions:  Your physician recommends that you continue on your current medications as directed. Please refer to the Current Medication list given to you today.  Labwork: None ordered.  Testing/Procedures: None ordered.  Follow-Up: Your physician wants you to follow-up in: 3 months with Cristopher Peru, MD    Any Other Special Instructions Will Be Listed Below (If Applicable).  If you need a refill on your cardiac medications before your next appointment, please call your pharmacy.

## 2021-09-07 NOTE — Progress Notes (Signed)
HPI Mr. Ethan Payne presents today for preoperative eval. He is a pleasant 75 yo man who I last saw 13 years ago. He presented with palpitations and was found to be in atrial fib and a 2D echo demonstrated a left atrial myxoma which was surgically removed and at that time he underwent CABG X2. He works as a Agricultural consultant and he has no limit to his physical activity. He carries boards up roofs and denies chest pain or sob. He remotely had LV dysfunction but this was thought due to the atrial fib that he had years ago with a tachy induced CM. No chest pain, sob or peripheral edema and no syncope.  Allergies  Allergen Reactions   Hydrocodone Palpitations    After rib fractures. Hydrocodone-States had shortness of breath, 911 was called After rib fractures. Hydrocodone-States had shortness of breath, 911 was called   Loratadine Other (See Comments) and Anxiety    Heart speeds up Other reaction(s): Other (See Comments) Heart speeds up     Current Outpatient Medications  Medication Sig Dispense Refill   amLODipine (NORVASC) 2.5 MG tablet Take 1 tablet (2.5 mg total) by mouth daily. 90 tablet 3   aspirin 325 MG tablet Take 325 mg by mouth daily.     lisinopril (ZESTRIL) 20 MG tablet TAKE 1 TABLET (20 MG TOTAL) BY MOUTH DAILY. 90 tablet 3   Multiple Vitamin (MULTIVITAMIN) tablet Take 1 tablet by mouth daily.     rosuvastatin (CRESTOR) 40 MG tablet Take 1 tablet (40 mg total) by mouth daily. 90 tablet 3   Zinc 50 MG TABS Take 1 tablet by mouth daily in the afternoon.     No current facility-administered medications for this visit.     Past Medical History:  Diagnosis Date   Acute prostatitis 02/04/2008   ATRIAL MYXOMA 02/04/2008   Closed fracture of four ribs 11/01/2009   HYPERLIPIDEMIA 02/04/2008   HYPERTENSION 02/04/2008   Osteoarthritis of right knee 04/18/2010    ROS:   All systems reviewed and negative except as noted in the HPI.   Past Surgical History:  Procedure  Laterality Date   CATARACT EXTRACTION Left 08/03/2019   CORONARY ARTERY BYPASS GRAFT  2007   x2 Saph vein   COX-MAZE MICROWAVE ABLATION  2007   EXCISION OF ATRIAL MYXOMA  2007   Dr C. Roxy Manns     Family History  Problem Relation Age of Onset   Stroke Mother    Heart disease Father        MI 40   Hypertension Father    Ovarian cancer Sister      Social History   Socioeconomic History   Marital status: Married    Spouse name: Not on file   Number of children: Not on file   Years of education: Not on file   Highest education level: Not on file  Occupational History   Occupation: married  Tobacco Use   Smoking status: Never   Smokeless tobacco: Never  Substance and Sexual Activity   Alcohol use: No   Drug use: No   Sexual activity: Yes  Other Topics Concern   Not on file  Social History Narrative   Married will be 50 years in 2019 (wife seen at another practice). 2 children-daughter unmarried, professional student 23 and son 72 in 2016. 2 grandsons-17 and 37 in 2019.       Still works as Clinical biochemist, does his own carpentry work  Hobbies: fishing   Social Determinants of Radio broadcast assistant Strain: Not on file  Food Insecurity: Not on file  Transportation Needs: Not on file  Physical Activity: Not on file  Stress: Not on file  Social Connections: Not on file  Intimate Partner Violence: Not on file     BP (!) 148/92   Pulse 69   Ht 5\' 10"  (1.778 m)   Wt 169 lb 6.4 oz (76.8 kg)   SpO2 97%   BMI 24.31 kg/m   Physical Exam:  Well appearing NAD HEENT: Unremarkable Neck:  No JVD, no thyromegally Lymphatics:  No adenopathy Back:  No CVA tenderness Lungs:  Clear with no wheezes HEART:  Regular rate rhythm, no murmurs, no rubs, no clicks Abd:  soft, positive bowel sounds, no organomegally, no rebound, no guarding; left sided hernia. Ext:  2 plus pulses, no edema, no cyanosis, no clubbing Skin:  No rashes no nodules Neuro:  CN II through  XII intact, motor grossly intact  EKG - nsr with rare PVC   Assess/Plan:  Preop eval - his is low risk for upcoming hernia surgery. I recommend he be allowed to proceed. Left atrial myxoma - he is s/p resection and is currently asymptomatic. PAF - none in over 13 years. He will undergo watchful waiting. LV dysfunction - his symptoms are class 1 and while I considered a 2D echo, it would not change my recommendations. He is an acceptable surgical risk.   Ethan Overlie Raphael Espe,MD

## 2021-09-18 ENCOUNTER — Institutional Professional Consult (permissible substitution): Payer: Medicare Other | Admitting: Internal Medicine

## 2021-09-25 NOTE — Telephone Encounter (Signed)
   Patient Name: Ethan Payne  DOB: 10/21/1946 MRN: [id number]  Primary Cardiologist: Dr. Lovena Le  Chart reviewed as part of pre-operative protocol coverage. Patient seen in office recently for pre-op evaluation. Will bundle this message with Dr. Tanna Furry note to requesting surgeon. Please call with questions.  Charlie Pitter, PA-C 09/25/2021, 7:54 AM

## 2021-10-19 DIAGNOSIS — K4020 Bilateral inguinal hernia, without obstruction or gangrene, not specified as recurrent: Secondary | ICD-10-CM | POA: Diagnosis not present

## 2021-12-12 ENCOUNTER — Other Ambulatory Visit: Payer: Self-pay

## 2021-12-12 MED ORDER — AMLODIPINE BESYLATE 2.5 MG PO TABS: 2.5000 mg | ORAL_TABLET | Freq: Every day | ORAL | 3 refills | Status: DC

## 2021-12-21 ENCOUNTER — Other Ambulatory Visit: Payer: Self-pay

## 2021-12-21 ENCOUNTER — Encounter: Payer: Self-pay | Admitting: Internal Medicine

## 2021-12-21 ENCOUNTER — Ambulatory Visit: Payer: Medicare Other | Admitting: Internal Medicine

## 2021-12-21 VITALS — BP 138/84 | HR 66 | Ht 66.0 in | Wt 174.4 lb

## 2021-12-21 DIAGNOSIS — I480 Paroxysmal atrial fibrillation: Secondary | ICD-10-CM | POA: Diagnosis not present

## 2021-12-21 DIAGNOSIS — D151 Benign neoplasm of heart: Secondary | ICD-10-CM

## 2021-12-21 NOTE — Patient Instructions (Addendum)
Medication Instructions:  Your physician recommends that you continue on your current medications as directed. Please refer to the Current Medication list given to you today.  Labwork: None ordered.  Testing/Procedures: None ordered.  Follow-Up: Your physician wants you to follow-up in: as needed with Gregg Taylor, MD    Any Other Special Instructions Will Be Listed Below (If Applicable).  If you need a refill on your cardiac medications before your next appointment, please call your pharmacy.       

## 2021-12-21 NOTE — Progress Notes (Signed)
HPI Ethan Payne returns today for followup. He is a pleasant 76 yo man with a remote h/o PAF associated with left atrial myxoma s/p extraction. He underwent hernia surgery a couple of months ago. He has done well in the interim with no problems after surgery. He denies palpitations, chest pain or sob and remains active repairing his house. No syncope.  Allergies  Allergen Reactions   Hydrocodone Palpitations    After rib fractures. Hydrocodone-States had shortness of breath, 911 was called After rib fractures. Hydrocodone-States had shortness of breath, 911 was called   Loratadine Other (See Comments) and Anxiety    Heart speeds up Other reaction(s): Other (See Comments) Heart speeds up     Current Outpatient Medications  Medication Sig Dispense Refill   amLODipine (NORVASC) 2.5 MG tablet Take 1 tablet (2.5 mg total) by mouth daily. 90 tablet 3   lisinopril (ZESTRIL) 20 MG tablet TAKE 1 TABLET (20 MG TOTAL) BY MOUTH DAILY. 90 tablet 3   Multiple Vitamin (MULTIVITAMIN) tablet Take 1 tablet by mouth daily.     rosuvastatin (CRESTOR) 40 MG tablet Take 1 tablet (40 mg total) by mouth daily. 90 tablet 3   Zinc 50 MG TABS Take 1 tablet by mouth daily in the afternoon.     No current facility-administered medications for this visit.     Past Medical History:  Diagnosis Date   Acute prostatitis 02/04/2008   ATRIAL MYXOMA 02/04/2008   Closed fracture of four ribs 11/01/2009   HYPERLIPIDEMIA 02/04/2008   HYPERTENSION 02/04/2008   Osteoarthritis of right knee 04/18/2010    ROS:   All systems reviewed and negative except as noted in the HPI.   Past Surgical History:  Procedure Laterality Date   CATARACT EXTRACTION Left 08/03/2019   CORONARY ARTERY BYPASS GRAFT  2007   x2 Saph vein   COX-MAZE MICROWAVE ABLATION  2007   EXCISION OF ATRIAL MYXOMA  2007   Dr C. Roxy Manns     Family History  Problem Relation Age of Onset   Stroke Mother    Heart disease Father        MI 46    Hypertension Father    Ovarian cancer Sister      Social History   Socioeconomic History   Marital status: Married    Spouse name: Not on file   Number of children: Not on file   Years of education: Not on file   Highest education level: Not on file  Occupational History   Occupation: married  Tobacco Use   Smoking status: Never   Smokeless tobacco: Never  Substance and Sexual Activity   Alcohol use: No   Drug use: No   Sexual activity: Yes  Other Topics Concern   Not on file  Social History Narrative   Married will be 50 years in 2019 (wife seen at another practice). 2 children-daughter unmarried, professional student 79 and son 22 in 2016. 2 grandsons-17 and 49 in 2019.       Still works as Clinical biochemist, does his own carpentry work      Office manager: fishing   Social Determinants of Radio broadcast assistant Strain: Not on [employer] Insecurity: Not on file  Transportation Needs: Not on file  Physical Activity: Not on file  Stress: Not on file  Social Connections: Not on file  Intimate Partner Violence: Not on file     BP 138/84    Pulse 66  Ht 5\' 6"  (1.676 m)    Wt 174 lb 6.4 oz (79.1 kg)    SpO2 98%    BMI 28.15 kg/m   Physical Exam:  Well appearing NAD HEENT: Unremarkable Neck:  No JVD, no thyromegally Lymphatics:  No adenopathy Back:  No CVA tenderness Lungs:  Clear with no wheezes HEART:  Regular rate rhythm, no murmurs, no rubs, no clicks Abd:  soft, positive bowel sounds, no organomegally, no rebound, no guarding Ext:  2 plus pulses, no edema, no cyanosis, no clubbing Skin:  No rashes no nodules Neuro:  CN II through XII intact, motor grossly intact  EKG - none  Assess/Plan:  PAF - he has not had any atrial fib for over 10 years and it appears that it was related to the myxoma. I have recommended watchful waiting and if he has more symptoms he will contact me. Left atrial myxoma - no recurrence. CAD - he is s/p remote CABG and has not had  angina despite being very active.  Ethan Overlie Dhanvi Boesen,MD

## 2022-01-21 ENCOUNTER — Ambulatory Visit: Payer: Medicare Other

## 2022-01-22 ENCOUNTER — Ambulatory Visit (INDEPENDENT_AMBULATORY_CARE_PROVIDER_SITE_OTHER): Payer: Medicare Other

## 2022-01-22 ENCOUNTER — Other Ambulatory Visit: Payer: Self-pay

## 2022-01-22 DIAGNOSIS — Z0000 Encounter for general adult medical examination without abnormal findings: Secondary | ICD-10-CM | POA: Diagnosis not present

## 2022-01-22 NOTE — Progress Notes (Signed)
Virtual Visit via Telephone Note  I connected with  Ethan Payne on 01/22/22 at  8:00 AM EST by telephone and verified that I am speaking with the correct person using two identifiers.  Medicare Annual Wellness visit completed telephonically due to Covid-19 pandemic.   Persons participating in this call: This Health Coach and this patient.   Location: Patient: home Provider: office   I discussed the limitations, risks, security and privacy concerns of performing an evaluation and management service by telephone and the availability of in person appointments. The patient expressed understanding and agreed to proceed.  Unable to perform video visit due to video visit attempted and failed and/or patient does not have video capability.   Some vital signs may be absent or patient reported.   Willette Brace, LPN   Subjective:   Ethan Payne is a 76 y.o. male who presents for Medicare Annual/Subsequent preventive examination.  Review of Systems     Cardiac Risk Factors include: advanced age (>24men, >40 women);hypertension;dyslipidemia;male gender     Objective:    There were no vitals filed for this visit. There is no height or weight on file to calculate BMI.  Advanced Directives 01/22/2022 10/06/2019 04/22/2016  Does Patient Have a Medical Advance Directive? No No No  Would patient like information on creating a medical advance directive? Yes (MAU/Ambulatory/Procedural Areas - Information given) No - Patient declined No - patient declined information    Current Medications (verified) Outpatient Encounter Medications as of 01/22/2022  Medication Sig   amLODipine (NORVASC) 2.5 MG tablet Take 1 tablet (2.5 mg total) by mouth daily. (Patient taking differently: Take 5 mg by mouth daily.)   lisinopril (ZESTRIL) 20 MG tablet TAKE 1 TABLET (20 MG TOTAL) BY MOUTH DAILY.   rosuvastatin (CRESTOR) 40 MG tablet Take 1 tablet (40 mg total) by mouth daily.   [DISCONTINUED] Multiple  Vitamin (MULTIVITAMIN) tablet Take 1 tablet by mouth daily.   [DISCONTINUED] Zinc 50 MG TABS Take 1 tablet by mouth daily in the afternoon.   No facility-administered encounter medications on file as of 01/22/2022.    Allergies (verified) Hydrocodone and Loratadine   History: Past Medical History:  Diagnosis Date   Acute prostatitis 02/04/2008   ATRIAL MYXOMA 02/04/2008   Closed fracture of four ribs 11/01/2009   HYPERLIPIDEMIA 02/04/2008   HYPERTENSION 02/04/2008   Osteoarthritis of right knee 04/18/2010   Past Surgical History:  Procedure Laterality Date   CATARACT EXTRACTION Left 08/03/2019   CORONARY ARTERY BYPASS GRAFT  2007   x2 Saph vein   COX-MAZE MICROWAVE ABLATION  2007   EXCISION OF ATRIAL MYXOMA  2007   Dr C. Roxy Manns   Family History  Problem Relation Age of Onset   Stroke Mother    Heart disease Father        MI 39   Hypertension Father    Ovarian cancer Sister    Social History   Socioeconomic History   Marital status: Married    Spouse name: Not on file   Number of children: Not on file   Years of education: Not on file   Highest education level: Not on file  Occupational History   Occupation: married  Tobacco Use   Smoking status: Never   Smokeless tobacco: Never  Substance and Sexual Activity   Alcohol use: No   Drug use: No   Sexual activity: Yes  Other Topics Concern   Not on file  Social History Narrative   Married will be  50 years in 2019 (wife seen at another practice). 2 children-daughter unmarried, professional student 69 and son 44 in 2016. 2 grandsons-17 and 19 in 2019.       Still works as Clinical biochemist, does his own carpentry work      Office manager: fishing   Social Determinants of Radio broadcast assistant Strain: Low Risk    Difficulty of Paying Living Expenses: Not hard at [place]: No Food Insecurity   Worried About Charity fundraiser in the Last Year: Never true   Arboriculturist in the Last Year: Never true   Transportation Needs: No Data processing manager (Medical): No   Lack of Transportation (Non-Medical): No  Physical Activity: Inactive   Days of Exercise per Week: 0 days   Minutes of Exercise per Session: 0 min  Stress: No Stress Concern Present   Feeling of Stress : Not at all  Social Connections: Moderately Isolated   Frequency of Communication with Friends and Family: More than three times a week   Frequency of Social Gatherings with Friends and Family: More than three times a week   Attends Religious Services: Never   Marine scientist or Organizations: No   Attends Music therapist: Never   Marital Status: Married    Tobacco Counseling Counseling given: Not Answered   Clinical Intake:  Pre-visit preparation completed: Yes  Pain : No/denies pain     BMI - recorded: 28.16 Nutritional Status: BMI 25 -29 Overweight Nutritional Risks: None Diabetes: No  How often do you need to have someone help you when you read instructions, pamphlets, or other written materials from your doctor or pharmacy?: 1 - Never  Diabetic?no   Interpreter Needed?: No  Information entered by :: Charlott Rakes, LPN   Activities of Daily Living In your present state of health, do you have any difficulty performing the following activities: 01/22/2022  Hearing? Y  Comment wears hearing aids  Vision? N  Difficulty concentrating or making decisions? N  Walking or climbing stairs? N  Dressing or bathing? N  Doing errands, shopping? N  Preparing Food and eating ? N  Using the Toilet? N  In the past six months, have you accidently leaked urine? N  Do you have problems with loss of bowel control? N  Managing your Medications? N  Managing your Finances? N  Housekeeping or managing your Housekeeping? N  Some recent data might be hidden    Patient Care Team: Marin Olp, MD as PCP - General (Family Medicine) Michael Boston, MD as Consulting  Physician (General Surgery) Lucas Mallow, MD as Consulting Physician (Urology)  Indicate any recent Medical Services you may have received from other than [facility] providers in the past year (date may be approximate).     Assessment:   This is a routine wellness examination for Ethan Payne.  Hearing/Vision screen Hearing Screening - Comments:: Pt wears hearing aids  Vision Screening - Comments:: Pt follows up with dr Brigitte Pulse for annual eye exams   Dietary issues and exercise activities discussed: Current Exercise Habits: The patient has a physically strenuous job, but has no regular exercise apart from work.   Goals Addressed             This Visit's Progress    Patient Stated       None at this time       Depression Screen PHQ 2/9 Scores 01/22/2022 10/06/2019 08/24/2018  08/21/2017 04/22/2016 02/17/2015 01/14/2014  PHQ - 2 Score 0 0 0 0 0 0 0    Fall Risk Fall Risk  01/22/2022 10/06/2019 08/24/2018 08/21/2017 04/22/2016  Falls in the past year? 0 0 Yes No No  Number falls in past yr: 0 - 1 - -  Injury with Fall? 0 0 Yes - -  Risk for fall due to : Impaired vision - - - -  Follow up Falls prevention discussed Falls evaluation completed;Education provided;Falls prevention discussed - - -    FALL RISK PREVENTION PERTAINING TO THE HOME:  Any stairs in or around the home? Yes  If so, are there any without handrails? No  Home free of loose throw rugs in walkways, pet beds, electrical cords, etc? Yes  Adequate lighting in your home to reduce risk of falls? Yes   ASSISTIVE DEVICES UTILIZED TO PREVENT FALLS:  Life alert? No  Use of a cane, walker or w/c? No  Grab bars in the bathroom? No  Shower chair or bench in shower? No  Elevated toilet seat or a handicapped toilet? No   TIMED UP AND GO:  Was the test performed? No .  Cognitive Function:     6CIT Screen 10/06/2019  What Year? 0 points  What month? 0 points  What time? 0 points  Count back from 20 0 points  Months in reverse  0 points  Repeat phrase 0 points  Total Score 0    Immunizations Immunization History  Administered Date(s) Administered   Influenza Whole 09/01/2009   Influenza,inj,Quad PF,6+ Mos 01/14/2014   Pneumococcal Polysaccharide-23 10/03/2009   Td 05/05/2009   Tdap 08/01/2014    TDAP status: Up to date  Flu Vaccine status: Declined, Education has been provided regarding the importance of this vaccine but patient still declined. Advised may receive this vaccine at local pharmacy or Health Dept. Aware to provide a copy of the vaccination record if obtained from local pharmacy or Health Dept. Verbalized acceptance and understanding.  Pneumococcal vaccine status: Declined,  Education has been provided regarding the importance of this vaccine but patient still declined. Advised may receive this vaccine at local pharmacy or Health Dept. Aware to provide a copy of the vaccination record if obtained from local pharmacy or Health Dept. Verbalized acceptance and understanding.   Covid-19 vaccine status: Declined, Education has been provided regarding the importance of this vaccine but patient still declined. Advised may receive this vaccine at local pharmacy or Health Dept.or vaccine clinic. Aware to provide a copy of the vaccination record if obtained from local pharmacy or Health Dept. Verbalized acceptance and understanding.  Qualifies for Shingles Vaccine? Yes   Zostavax completed No   Shingrix Completed?: No.    Education has been provided regarding the importance of this vaccine. Patient has been advised to call insurance company to determine out of pocket expense if they have not yet received this vaccine. Advised may also receive vaccine at local pharmacy or Health Dept. Verbalized acceptance and understanding.  Screening Tests Health Maintenance  Topic Date Due   Fecal DNA (Cologuard)  05/23/2024   TETANUS/TDAP  08/01/2024   Hepatitis C Screening  Completed   HPV VACCINES  Aged Out    Pneumonia Vaccine 33+ Years old  Discontinued   INFLUENZA VACCINE  Discontinued   COVID-19 Vaccine  Discontinued   Zoster Vaccines- Shingrix  Discontinued    Health Maintenance  There are no preventive care reminders to display for this patient.   Colorectal cancer screening:  Type of screening: Cologuard. Completed 05/23/21. Repeat every 3 years Additional Screening:  Hepatitis C Screening:  Completed 04/22/16  Vision Screening: Recommended annual ophthalmology exams for early detection of glaucoma and other disorders of the eye. Is the patient up to date with their annual eye exam?  Yes  Who is the provider or what is the name of the office in which the patient attends annual eye exams? Dr Brigitte Pulse If pt is not established with a provider, would they like to be referred to a provider to establish care? No .   Dental Screening: Recommended annual dental exams for proper oral hygiene  Community Resource Referral / Chronic Care Management: CRR required this visit?  No   CCM required this visit?  No      Plan:     I have personally reviewed and noted the following in the patients chart:   Medical and social history Use of alcohol, tobacco or illicit drugs  Current medications and supplements including opioid prescriptions. Patient is not currently taking opioid prescriptions. Functional ability and status Nutritional status Physical activity Advanced directives List of other physicians Hospitalizations, surgeries, and ER visits in previous 12 months Vitals Screenings to include cognitive, depression, and falls Referrals and appointments  In addition, I have reviewed and discussed with patient certain preventive protocols, quality metrics, and best practice recommendations. A written personalized care plan for preventive services as well as general preventive health recommendations were provided to patient.     Willette Brace, LPN   04/28/7823   Nurse Notes: pt stated he  increased amlodipine 5 mg and has some dizziness at times . He stated that his blood pressure was 137/82 on yesterday and he takes it twice a week please advice

## 2022-01-22 NOTE — Patient Instructions (Signed)
Ethan Payne , Thank you for taking time to come for your Medicare Wellness Visit. I appreciate your ongoing commitment to your health goals. Please review the following plan we discussed and let me know if I can assist you in the future.   Screening recommendations/referrals: Colonoscopy: Done 05/23/21 repeat cologuard every 3 years  Recommended yearly ophthalmology/optometry visit for glaucoma screening and checkup Recommended yearly dental visit for hygiene and checkup  Vaccinations: Influenza vaccine: Declined and discussed Pneumococcal vaccine: declined and discussed  Tdap vaccine: Done 08/01/14 repeat every 10 years  Shingles vaccine: declined and discussed    Covid-19: Declined and discussed   Advanced directives: Advance directive discussed with you today. I have provided a copy for you to complete at home and have notarized. Once this is complete please bring a copy in to our office so we can scan it into your chart.  Conditions/risks identified: None at this time   Next appointment: Follow up in one year for your annual wellness visit.   Preventive Care 11 Years and Older, Male Preventive care refers to lifestyle choices and visits with your health care provider that can promote health and wellness. What does preventive care include? A yearly physical exam. This is also called an annual well check. Dental exams once or twice a year. Routine eye exams. Ask your health care provider how often you should have your eyes checked. Personal lifestyle choices, including: Daily care of your teeth and gums. Regular physical activity. Eating a healthy diet. Avoiding tobacco and drug use. Limiting alcohol use. Practicing safe sex. Taking low doses of aspirin every day. Taking vitamin and mineral supplements as recommended by your health care provider. What happens during an annual well check? The services and screenings done by your health care provider during your annual well check  will depend on your age, overall health, lifestyle risk factors, and family history of disease. Counseling  Your health care provider may ask you questions about your: Alcohol use. Tobacco use. Drug use. Emotional well-being. Home and relationship well-being. Sexual activity. Eating habits. History of falls. Memory and ability to understand (cognition). Work and work Statistician. Screening  You may have the following tests or measurements: Height, weight, and BMI. Blood pressure. Lipid and cholesterol levels. These may be checked every 5 years, or more frequently if you are over 83 years old. Skin check. Lung cancer screening. You may have this screening every year starting at age 6 if you have a 30-pack-year history of smoking and currently smoke or have quit within the past 15 years. Fecal occult blood test (FOBT) of the stool. You may have this test every year starting at age 29. Flexible sigmoidoscopy or colonoscopy. You may have a sigmoidoscopy every 5 years or a colonoscopy every 10 years starting at age 26. Prostate cancer screening. Recommendations will vary depending on your family history and other risks. Hepatitis C blood test. Hepatitis B blood test. Sexually transmitted disease (STD) testing. Diabetes screening. This is done by checking your blood sugar (glucose) after you have not eaten for a while (fasting). You may have this done every 1-3 years. Abdominal aortic aneurysm (AAA) screening. You may need this if you are a current or former smoker. Osteoporosis. You may be screened starting at age 44 if you are at high risk. Talk with your health care provider about your test results, treatment options, and if necessary, the need for more tests. Vaccines  Your health care provider may recommend certain vaccines, such as: Influenza  vaccine. This is recommended every year. Tetanus, diphtheria, and acellular pertussis (Tdap, Td) vaccine. You may need a Td booster every 10  years. Zoster vaccine. You may need this after age 41. Pneumococcal 13-valent conjugate (PCV13) vaccine. One dose is recommended after age 77. Pneumococcal polysaccharide (PPSV23) vaccine. One dose is recommended after age 32. Talk to your health care provider about which screenings and vaccines you need and how often you need them. This information is not intended to replace advice given to you by your health care provider. Make sure you discuss any questions you have with your health care provider. Document Released: 12/15/2015 Document Revised: 08/07/2016 Document Reviewed: 09/19/2015 Elsevier Interactive Patient Education  2017 [employer] Prevention in the Home Falls can cause injuries. They can happen to people of all ages. There are many things you can do to make your home safe and to help prevent falls. What can I do on the outside of my home? Regularly fix the edges of walkways and driveways and fix any cracks. Remove anything that might make you trip as you walk through a door, such as a raised step or threshold. Trim any bushes or trees on the path to your home. Use bright outdoor lighting. Clear any walking paths of anything that might make someone trip, such as rocks or tools. Regularly check to see if handrails are loose or broken. Make sure that both sides of any steps have handrails. Any raised decks and porches should have guardrails on the edges. Have any leaves, snow, or ice cleared regularly. Use sand or salt on walking paths during winter. Clean up any spills in your garage right away. This includes oil or grease spills. What can I do in the bathroom? Use night lights. Install grab bars by the toilet and in the tub and shower. Do not use towel bars as grab bars. Use non-skid mats or decals in the tub or shower. If you need to sit down in the shower, use a plastic, non-slip stool. Keep the floor dry. Clean up any water that spills on the floor as soon as it  happens. Remove soap buildup in the tub or shower regularly. Attach bath mats securely with double-sided non-slip rug tape. Do not have throw rugs and other things on the floor that can make you trip. What can I do in the bedroom? Use night lights. Make sure that you have a light by your bed that is easy to reach. Do not use any sheets or blankets that are too big for your bed. They should not hang down onto the floor. Have a firm chair that has side arms. You can use this for support while you get dressed. Do not have throw rugs and other things on the floor that can make you trip. What can I do in the kitchen? Clean up any spills right away. Avoid walking on wet floors. Keep items that you use a lot in easy-to-reach places. If you need to reach something above you, use a strong step stool that has a grab bar. Keep electrical cords out of the way. Do not use floor polish or wax that makes floors slippery. If you must use wax, use non-skid floor wax. Do not have throw rugs and other things on the floor that can make you trip. What can I do with my stairs? Do not leave any items on the stairs. Make sure that there are handrails on both sides of the stairs and use them. Fix  handrails that are broken or loose. Make sure that handrails are as long as the stairways. Check any carpeting to make sure that it is firmly attached to the stairs. Fix any carpet that is loose or worn. Avoid having throw rugs at the top or bottom of the stairs. If you do have throw rugs, attach them to the floor with carpet tape. Make sure that you have a light switch at the top of the stairs and the bottom of the stairs. If you do not have them, ask someone to add them for you. What else can I do to help prevent falls? Wear shoes that: Do not have high heels. Have rubber bottoms. Are comfortable and fit you well. Are closed at the toe. Do not wear sandals. If you use a stepladder: Make sure that it is fully opened.  Do not climb a closed stepladder. Make sure that both sides of the stepladder are locked into place. Ask someone to hold it for you, if possible. Clearly mark and make sure that you can see: Any grab bars or handrails. First and last steps. Where the edge of each step is. Use tools that help you move around (mobility aids) if they are needed. These include: Canes. Walkers. Scooters. Crutches. Turn on the lights when you go into a dark area. Replace any light bulbs as soon as they burn out. Set up your furniture so you have a clear path. Avoid moving your furniture around. If any of your floors are uneven, fix them. If there are any pets around you, be aware of where they are. Review your medicines with your doctor. Some medicines can make you feel dizzy. This can increase your chance of falling. Ask your doctor what other things that you can do to help prevent falls. This information is not intended to replace advice given to you by your health care provider. Make sure you discuss any questions you have with your health care provider. Document Released: 09/14/2009 Document Revised: 04/25/2016 Document Reviewed: 12/23/2014 Elsevier Interactive Patient Education  2017 [employer].

## 2022-02-21 DIAGNOSIS — H26491 Other secondary cataract, right eye: Secondary | ICD-10-CM | POA: Diagnosis not present

## 2022-04-11 DIAGNOSIS — C44612 Basal cell carcinoma of skin of right upper limb, including shoulder: Secondary | ICD-10-CM | POA: Diagnosis not present

## 2022-04-11 DIAGNOSIS — D225 Melanocytic nevi of trunk: Secondary | ICD-10-CM | POA: Diagnosis not present

## 2022-04-11 DIAGNOSIS — D492 Neoplasm of unspecified behavior of bone, soft tissue, and skin: Secondary | ICD-10-CM | POA: Diagnosis not present

## 2022-04-11 DIAGNOSIS — L814 Other melanin hyperpigmentation: Secondary | ICD-10-CM | POA: Diagnosis not present

## 2022-04-11 DIAGNOSIS — L821 Other seborrheic keratosis: Secondary | ICD-10-CM | POA: Diagnosis not present

## 2022-04-11 DIAGNOSIS — C44519 Basal cell carcinoma of skin of other part of trunk: Secondary | ICD-10-CM | POA: Diagnosis not present

## 2022-05-16 DIAGNOSIS — C44519 Basal cell carcinoma of skin of other part of trunk: Secondary | ICD-10-CM | POA: Diagnosis not present

## 2022-05-30 DIAGNOSIS — C44612 Basal cell carcinoma of skin of right upper limb, including shoulder: Secondary | ICD-10-CM | POA: Diagnosis not present

## 2022-06-06 ENCOUNTER — Other Ambulatory Visit: Payer: Self-pay | Admitting: Family Medicine

## 2022-07-15 DIAGNOSIS — L448 Other specified papulosquamous disorders: Secondary | ICD-10-CM | POA: Diagnosis not present

## 2022-07-15 DIAGNOSIS — L821 Other seborrheic keratosis: Secondary | ICD-10-CM | POA: Diagnosis not present

## 2022-07-15 DIAGNOSIS — D225 Melanocytic nevi of trunk: Secondary | ICD-10-CM | POA: Diagnosis not present

## 2022-07-15 DIAGNOSIS — L814 Other melanin hyperpigmentation: Secondary | ICD-10-CM | POA: Diagnosis not present

## 2022-10-29 ENCOUNTER — Ambulatory Visit (INDEPENDENT_AMBULATORY_CARE_PROVIDER_SITE_OTHER): Payer: Medicare Other | Admitting: Family Medicine

## 2022-10-29 ENCOUNTER — Encounter: Payer: Self-pay | Admitting: Family Medicine

## 2022-10-29 VITALS — BP 162/96 | HR 83 | Temp 98.6°F | Ht 66.0 in | Wt 171.0 lb

## 2022-10-29 DIAGNOSIS — N401 Enlarged prostate with lower urinary tract symptoms: Secondary | ICD-10-CM | POA: Diagnosis not present

## 2022-10-29 DIAGNOSIS — R351 Nocturia: Secondary | ICD-10-CM

## 2022-10-29 DIAGNOSIS — R1310 Dysphagia, unspecified: Secondary | ICD-10-CM

## 2022-10-29 DIAGNOSIS — E785 Hyperlipidemia, unspecified: Secondary | ICD-10-CM | POA: Diagnosis not present

## 2022-10-29 DIAGNOSIS — I2510 Atherosclerotic heart disease of native coronary artery without angina pectoris: Secondary | ICD-10-CM

## 2022-10-29 DIAGNOSIS — I10 Essential (primary) hypertension: Secondary | ICD-10-CM | POA: Diagnosis not present

## 2022-10-29 MED ORDER — OMEPRAZOLE 40 MG PO CPDR: 40.0000 mg | DELAYED_RELEASE_CAPSULE | Freq: Every day | ORAL | 3 refills | Status: DC

## 2022-10-29 NOTE — Progress Notes (Signed)
Phone [phone] In person visit   Subjective:   Ethan Payne is a 76 y.o. year old very pleasant male patient who presents for/with See problem oriented charting Chief Complaint  Patient presents with   Dysphagia    Pt states it been hard to swallow for about 3 mos, and the vomitting started a month ago.    Emesis   Past Medical History-  Patient Active Problem List   Diagnosis Date Noted   CAD (coronary artery disease) 02/17/2015    Priority: High   s/p excision of Atrial myxoma 02/04/2008    Priority: High   BPH associated with nocturia 04/10/2015    Priority: Medium    Hyperlipidemia 02/04/2008    Priority: Medium    Essential hypertension 02/04/2008    Priority: Medium    Leg length difference, acquired 01/07/2018    Priority: Low   Rotator cuff syndrome of right shoulder 12/08/2017    Priority: [place] care maintenance 02/18/2015    Priority: Low   Erectile dysfunction 02/18/2015    Priority: Low   Heel pain 03/22/2011    Priority: Low   GERD (gastroesophageal reflux disease) 03/22/2011    Priority: Low   Osteoarthritis of right knee 04/18/2010    Priority: Low   Paroxysmal atrial fibrillation ([place]) 12/21/2021    Medications- reviewed and updated Current Outpatient Medications  Medication Sig Dispense Refill   amLODipine (NORVASC) 2.5 MG tablet Take 1 tablet (2.5 mg total) by mouth daily. (Patient taking differently: Take 5 mg by mouth daily.) 90 tablet 3   aspirin EC 81 MG tablet Take 81 mg by mouth daily. Swallow whole.     lisinopril (ZESTRIL) 20 MG tablet TAKE 1 TABLET(20 MG) BY MOUTH DAILY 90 tablet 3   omeprazole (PRILOSEC) 40 MG capsule Take 1 capsule (40 mg total) by mouth daily. 30 capsule 3   rosuvastatin (CRESTOR) 40 MG tablet TAKE 1 TABLET(40 MG) BY MOUTH DAILY 90 tablet 3   No current facility-administered medications for this visit.     Objective:  BP (!) 162/96   Pulse 83   Temp 98.6 F (37 C) (Temporal)   Ht '5\' 6"'$  (1.676 m)    Wt 171 lb (77.6 kg)   SpO2 98%   BMI 27.60 kg/m  Gen: NAD, resting comfortably Tympanic membrane's normal bilaterally CV: RRR no murmurs rubs or gallops Lungs: CTAB no crackles, wheeze, rhonchi Abdomen: soft/nontender/nondistended/normal bowel sounds. No rebound or guarding.  Diastases recti noted Ext: no edema Skin: warm, dry Neuro: grossly normal, moves all extremities      Assessment and Plan   # dysphagia S:symptoms started 3 months ago- difficulty swallowing. Gets a coughing fit- food gets hung- tap water helps resolve it. Could go again for a week without issues at first. No pain with this.   About a month ago started with vomiting- has had 3-4 episodes in the last month. Family noted epigastric protrusion and they were concerned (he has noted for years after CABG- he reports comes and goes- he states not getting bigger but family feels it is). Most recent episode was on Sunday with big breakfast- threw up 3-4x. No trouble with pills. Breads and dry meats- [place] and chicken very bothersome  Weight only down a few lbs since november Wt Readings from Last 3 Encounters:  10/29/22 171 lb (77.6 kg)  12/21/21 174 lb 6.4 oz (79.1 kg)  09/07/21 169 lb 6.4 oz (76.8 kg)   A/P: Suspect esophageal stricture-has underlying  GERD which could be exacerbating - Start omeprazole 40 mg daily before breakfast - Refer to GI and discussed potential need for endoscopy but will defer to GI expertise  Also noted diastases recti-discussed not likely related  # hearing loss S:hearing has been worse and he is concerned about ear wax  A/P: No significant earwax on exam-apparently patient already has hearing aids and I recommended he start using these   #hypertension S: medication: lisinopril 20 mg, amlodipine 2.5 mg BP Readings from Last 3 Encounters:  10/29/22 (!) 162/96  12/21/21 138/84  09/07/21 (!) 148/92  A/P: white coat hypertension- he agrees to do some home monitoring for me as has  not checked much recnetly.  Poor control in office and no regular recent readings at home-he agrees to do some monitoring and update me in 2 to 3 weeks-May need medication adjustment if above goal 135/85   # CAD with history of two-vessel bypass though primarily done due to already having open heart surgery for atrial myxoma  #hyperlipidemia S: Medication: rosuvastatin 40 mg daily.  He stopped aspirin around time of hernia surgery and has not restarted No chest pain or shortness of breath Lab Results  Component Value Date   CHOL 175 04/27/2021   HDL 50.80 04/27/2021   LDLCALC 110 (H) 04/27/2021   LDLDIRECT 103.0 02/15/2020   TRIG 74.0 04/27/2021   CHOLHDL 3 04/27/2021   A/P: CAD asymptomatic-recommended restarting aspirin and continuing statin.  Overdue for lipid panel-update today but suspect will continue current dose of rosuvastatin   #Elevated PSA_ went to see urology Dr. Gloriann Loan August 2022 with plan for 3 month follow up but he has not been back yet and requests for me to complete this today-we can refer him back to urology Lab Results  Component Value Date   PSA 4.19 (H) 04/27/2021   PSA 3.07 02/15/2020   PSA 4.08 (H) 10/09/2017    Recommended follow up: Return in about 6 months (around 04/29/2023) for followup or sooner if needed.Schedule b4 you leave. Future Appointments  Date Time Provider [place]  11/12/2022  8:45 AM [facility] LAB [facility]  02/06/2023  8:00 AM [facility] HEALTH COACH [facility]    Lab/Order associations:   ICD-10-CM   1. BPH associated with nocturia  N40.1 PSA   R35.1     2. Essential hypertension  I10 CBC with Differential/Platelet    Comprehensive metabolic panel    Lipid panel    3. Dysphagia, unspecified type  R13.10 Ambulatory referral to Gastroenterology    4. Hyperlipidemia, unspecified hyperlipidemia type  E78.5     5. Coronary artery disease involving native coronary artery of native heart without angina pectoris  I25.10        Meds ordered this encounter  Medications   omeprazole (PRILOSEC) 40 MG capsule    Sig: Take 1 capsule (40 mg total) by mouth daily.    Dispense:  30 capsule    Refill:  3    Return precautions advised.  Garret Reddish, MD

## 2022-10-29 NOTE — Patient Instructions (Addendum)
[facility] contact (stomach docs) Please call to schedule visit and/or procedure Address: [address], [place], [place] [address] Phone: [phone]  Start omeprazole 40 mg before breakfast- I think reflux could be causing a stricture in esophagus making it hard to swallow- we need to calm reflux and get GI opinion on if you need a camera evaluation down the throat  Schedule a lab visit at the check out desk within 2 weeks. Return for future fasting labs meaning nothing but water after midnight please. Ok to take your medications with water.   Diastasis Recti is the name of the bulge at top of abdomen- do not need surgery for this unless having pain  Your blood pressure trend concerns me. I would like for you to buy/use a home cuff to check at least 4x a week. Your goal is <135/85.  Update me in 2-3 weeks by mychart.   Recommended follow up: No follow-ups on file.

## 2022-11-08 ENCOUNTER — Ambulatory Visit: Payer: Medicare Other | Admitting: Gastroenterology

## 2022-11-08 ENCOUNTER — Encounter: Payer: Self-pay | Admitting: Gastroenterology

## 2022-11-08 VITALS — BP 152/82 | HR 84 | Ht 69.0 in | Wt 172.0 lb

## 2022-11-08 DIAGNOSIS — K219 Gastro-esophageal reflux disease without esophagitis: Secondary | ICD-10-CM

## 2022-11-08 DIAGNOSIS — D151 Benign neoplasm of heart: Secondary | ICD-10-CM

## 2022-11-08 DIAGNOSIS — R12 Heartburn: Secondary | ICD-10-CM

## 2022-11-08 DIAGNOSIS — Z951 Presence of aortocoronary bypass graft: Secondary | ICD-10-CM

## 2022-11-08 DIAGNOSIS — R1310 Dysphagia, unspecified: Secondary | ICD-10-CM | POA: Diagnosis not present

## 2022-11-08 NOTE — Patient Instructions (Addendum)
You have been scheduled for an endoscopy. Please follow written instructions given to you at your visit today. If you use inhalers (even only as needed), please bring them with you on the day of your procedure.   If you are age 74 or older, your body mass index should be between 23-30. Your Body mass index is 25.4 kg/m. If this is out of the aforementioned range listed, please consider follow up with your Primary Care Provider.  __________________________________________________________  The [facility] providers would like to encourage you to use [facility] to communicate with providers for non-urgent requests or questions.  Due to long hold times on the telephone, sending your provider a message by [facility] may be a faster and more efficient way to get a response.  Please allow 48 business hours for a response.  Please remember that this is for non-urgent requests.   If you are age 53 or older, your body mass index should be between 23-30. Your Body mass index is 25.4 kg/m. If this is out of the aforementioned range listed, please consider follow up with your Primary Care Provider.  If you are age 32 or younger, your body mass index should be between 19-25. Your Body mass index is 25.4 kg/m. If this is out of the aformentioned range listed, please consider follow up with your Primary Care Provider.   __________________________________________________________  The [facility] providers would like to encourage you to use [facility] to communicate with providers for non-urgent requests or questions.  Due to long hold times on the telephone, sending your provider a message by [facility] may be a faster and more efficient way to get a response.  Please allow 48 business hours for a response.  Please remember that this is for non-urgent requests.     Thank you for choosing me and [facility].  Vito Cirigliano, D.O.

## 2022-11-08 NOTE — Progress Notes (Signed)
Chief Complaint: Dysphagia   Referring Provider:     Marin Olp, MD   HPI:     Ethan Payne is a 76 y.o. male with history of HTN, HLD, CAD with 2V CABG and atrial myxoma surgery in 2007, referred to the Gastroenterology Clinic for evaluation of dysphagia.  Dysphagia started about 3-4 months ago.  Symptoms occur intermittently. Points to mid sternum. Had 2 episodes of vomiting/food regurgitation with these episodes. Developed hiccups during these episodes. Difficulty tolerating ice cold liquids for the last year- no odynophagia, +dysphagia. No issue with carbonated beverages.  No weight loss, fever, chills, night sweats.  Long hx of indigestion, heartburn. Takes 5-6 Tums/day for many years and sleeps with HOB elevated.  No previous history of PPI or H2 RA use.  Was seen by his PCM for this issue on 12/21/2021.  Started omeprazole 40 mg/day and referred to the GI clinic.  Since then, has no further episodes of emesis, no heartburn or hiccups.   EGD 20+ years ago by Dr. Velora Heckler.  Unsure of the results and none for review in EMR.  Last colonoscopy 20+ years ago by Dr. Velora Heckler. Cologuard negative in 05/2021.  Normal CBC, CMP in 04/2021.  No more recent labs for review, and no recent chest/abdominal imaging for review.   Past Medical History:  Diagnosis Date   Acute prostatitis 02/04/2008   ATRIAL MYXOMA 02/04/2008   Closed fracture of four ribs 11/01/2009   HYPERLIPIDEMIA 02/04/2008   HYPERTENSION 02/04/2008   Osteoarthritis of right knee 04/18/2010     Past Surgical History:  Procedure Laterality Date   CATARACT EXTRACTION Left 08/03/2019   CORONARY ARTERY BYPASS GRAFT  2007   x2 Saph vein   COX-MAZE MICROWAVE ABLATION  2007   EXCISION OF ATRIAL MYXOMA  2007   Dr C. Roxy Manns   Family History  Problem Relation Age of Onset   Stroke Mother    Heart disease Father        MI 13   Hypertension Father    Ovarian cancer Sister    Social History   Tobacco Use    Smoking status: Never   Smokeless tobacco: Never  Substance Use Topics   Alcohol use: No   Drug use: No   Current Outpatient Medications  Medication Sig Dispense Refill   amLODipine (NORVASC) 2.5 MG tablet Take 1 tablet (2.5 mg total) by mouth daily. (Patient taking differently: Take 5 mg by mouth daily.) 90 tablet 3   aspirin EC 81 MG tablet Take 81 mg by mouth daily. Swallow whole.     lisinopril (ZESTRIL) 20 MG tablet TAKE 1 TABLET(20 MG) BY MOUTH DAILY 90 tablet 3   omeprazole (PRILOSEC) 40 MG capsule Take 1 capsule (40 mg total) by mouth daily. 30 capsule 3   rosuvastatin (CRESTOR) 40 MG tablet TAKE 1 TABLET(40 MG) BY MOUTH DAILY 90 tablet 3   No current facility-administered medications for this visit.   Allergies  Allergen Reactions   Hydrocodone Palpitations    After rib fractures. Hydrocodone-States had shortness of breath, 911 was called After rib fractures. Hydrocodone-States had shortness of breath, 911 was called   Loratadine Other (See Comments) and Anxiety    Heart speeds up Other reaction(s): Other (See Comments) Heart speeds up     Review of Systems: All systems reviewed and negative except where noted in HPI.     Physical Exam:    Wt  Readings from Last 3 Encounters:  11/08/22 172 lb (78 kg)  10/29/22 171 lb (77.6 kg)  12/21/21 174 lb 6.4 oz (79.1 kg)    BP (!) 152/82   Pulse 84   Ht '5\' 9"'$  (1.753 m)   Wt 172 lb (78 kg)   BMI 25.40 kg/m  Constitutional:  Pleasant, in no acute distress. Psychiatric: Normal mood and affect. Behavior is normal. EENT: Pupils normal.  Conjunctivae are normal. No scleral icterus. Pulmonary/chest: Effort normal and breath sounds normal. No wheezing, rales or rhonchi. Abdominal: Soft, nondistended, nontender. Bowel sounds active throughout. There are no masses palpable. No hepatomegaly. Neurological: Alert and oriented to person place and time. Skin: Skin is warm and dry. No rashes noted.   ASSESSMENT AND PLAN;   1)  Dysphagia - EGD to evaluate for etiology along with esophageal dilation and/or biopsies as appropriate - Cut food into small pieces, chew thoroughly, and drink plenty of fluids with meals - Plan for expedited EGD  2) GERD 3) Heartburn - Good clinical response to trial of PPI - Continue omeprazole as currently prescribed - Continue antireflux lifestyle/dietary modifications with avoidance of exacerbating type foods  4) History of CABG 5) History of atrial myxoma - Ok to resume ASA 81 mg in the perioperative setting  The indications, risks, and benefits of EGD were explained to the patient in detail. Risks include but are not limited to bleeding, perforation, adverse reaction to medications, and cardiopulmonary compromise. Sequelae include but are not limited to the possibility of surgery, hospitalization, and mortality. The patient verbalized understanding and wished to proceed. All questions answered, referred to scheduler. Further recommendations pending results of the exam.    Lavena Bullion, DO, FACG  11/08/2022, 8:30 AM   Yong Channel Brayton Mars, MD

## 2022-11-11 ENCOUNTER — Encounter: Payer: Self-pay | Admitting: Gastroenterology

## 2022-11-11 ENCOUNTER — Ambulatory Visit (AMBULATORY_SURGERY_CENTER): Payer: Medicare Other | Admitting: Gastroenterology

## 2022-11-11 VITALS — BP 147/72 | HR 70 | Temp 97.3°F | Resp 12 | Ht 69.0 in | Wt 172.0 lb

## 2022-11-11 DIAGNOSIS — K219 Gastro-esophageal reflux disease without esophagitis: Secondary | ICD-10-CM | POA: Diagnosis not present

## 2022-11-11 DIAGNOSIS — R1310 Dysphagia, unspecified: Secondary | ICD-10-CM | POA: Diagnosis not present

## 2022-11-11 DIAGNOSIS — K449 Diaphragmatic hernia without obstruction or gangrene: Secondary | ICD-10-CM | POA: Diagnosis not present

## 2022-11-11 DIAGNOSIS — K222 Esophageal obstruction: Secondary | ICD-10-CM

## 2022-11-11 DIAGNOSIS — B3781 Candidal esophagitis: Secondary | ICD-10-CM

## 2022-11-11 MED ORDER — DIFLUCAN 100 MG PO TABS: ORAL_TABLET | ORAL | 0 refills | Status: DC

## 2022-11-11 MED ORDER — SODIUM CHLORIDE 0.9 % IV SOLN: 500.0000 mL | Freq: Once | INTRAVENOUS | Status: DC

## 2022-11-11 MED ORDER — OMEPRAZOLE 20 MG PO CPDR: 20.0000 mg | DELAYED_RELEASE_CAPSULE | Freq: Two times a day (BID) | ORAL | 1 refills | Status: DC

## 2022-11-11 NOTE — Op Note (Signed)
Central Patient Name: Ethan Payne Procedure Date: 11/11/2022 9:24 AM MRN: [id number] Endoscopist: Gerrit Heck , MD, [id number] Age: 76 Referring MD:  Date of Birth: 15-Feb-1946 Gender: Male Account #: [id number] Procedure:                Upper GI endoscopy Indications:              Dysphagia, Heartburn, Suspected esophageal reflux Medicines:                Monitored Anesthesia Care Procedure:                Pre-Anesthesia Assessment:                           - Prior to the procedure, a History and Physical                            was performed, and patient medications and                            allergies were reviewed. The patient's tolerance of                            previous anesthesia was also reviewed. The risks                            and benefits of the procedure and the sedation                            options and risks were discussed with the patient.                            All questions were answered, and informed consent                            was obtained. Prior Anticoagulants: The patient has                            taken no anticoagulant or antiplatelet agents                            except for aspirin. ASA Grade Assessment: II - A                            patient with mild systemic disease. After reviewing                            the risks and benefits, the patient was deemed in                            satisfactory condition to undergo the procedure.                           After obtaining informed consent, the endoscope was  passed under direct vision. Throughout the                            procedure, the patient's blood pressure, pulse, and                            oxygen saturations were monitored continuously. The                            GIF [id number] #[id number] was introduced through the                            mouth, and advanced to the second part of duodenum.                             The upper GI endoscopy was accomplished without                            difficulty. The patient tolerated the procedure                            well. Scope In: Scope Out: Findings:                 One benign-appearing, intrinsic moderate stenosis                            was found 38 cm from the incisors. This stenosis                            measured 1 cm (in length). The stenosis was                            traversed. A TTS dilator was passed through the                            scope. Dilation with a 16-17-18 mm balloon dilator                            was performed to 18 mm. The dilation site was                            examined and showed mild mucosal disruption and                            moderate improvement in luminal narrowing. This was                            then biopsied with a cold forceps for further                            fracturing of the ring (no path submitted).  Estimated blood loss was minimal.                           Localized, yellow plaques were found in the upper                            third of the esophagus. Biopsies were taken with a                            cold forceps for histology. Estimated blood loss                            was minimal.                           A 1 cm sliding type hiatal hernia was present.                           The gastroesophageal flap valve was visualized                            endoscopically and classified as Hill Grade III                            (minimal fold, loose to endoscope, hiatal hernia                            likely).                           The entire examined stomach was normal.                           The examined duodenum was normal. Complications:            No immediate complications. Estimated Blood Loss:     Estimated blood loss was minimal. Impression:               - Benign-appearing esophageal stenosis in the                             distla esophagus. Dilated with 18 mm TTS balloon                            with appropriate mucosal rent, then further                            fractured with cold forceps.                           - Esophageal plaques were found. Biopsied.                           - 1 cm hiatal hernia.                           -  Gastroesophageal flap valve classified as Hill                            Grade III (minimal fold, loose to endoscope, hiatal                            hernia likely).                           - Normal stomach.                           - Normal examined duodenum. Recommendation:           - Patient has a contact number available for                            emergencies. The signs and symptoms of potential                            delayed complications were discussed with the                            patient. Return to normal activities tomorrow.                            Written discharge instructions were provided to the                            patient.                           - Soft diet today, then advance tomorrow as                            tolerated per post dilation protocol.                           - Continue present medications.                           - Await pathology results.                           - Use Prilosec (omeprazole) 20 mg PO BID for 4                            weeks, then reduce to 20 mg daily.                           - Repeat upper endoscopy PRN for retreatment.                           - Return to GI clinic in 3-4 months, or sooner prn.                           -  Diflucan (fluconazole) 200 mg x1 day, then 100 mg                            PO daily for 3 weeks. Gerrit Heck, MD 11/11/2022 9:56:41 AM

## 2022-11-11 NOTE — Progress Notes (Signed)
Called to room to assist during endoscopic procedure.  Patient ID and intended procedure confirmed with present staff. Received instructions for my participation in the procedure from the performing physician.  

## 2022-11-11 NOTE — Progress Notes (Signed)
GASTROENTEROLOGY PROCEDURE H&P NOTE   Primary Care Physician: Marin Olp, MD    Reason for Procedure:   Dysphagia, GERD, heartburn  Plan:    EGD with dilation and/or bxs  Patient is appropriate for endoscopic procedure(s) in the ambulatory ([facility]) setting.  The nature of the procedure, as well as the risks, benefits, and alternatives were carefully and thoroughly reviewed with the patient. Ample time for discussion and questions allowed. The patient understood, was satisfied, and agreed to proceed.     HPI: Ethan Payne is a 76 y.o. male who presents for EGD for evaluation of dysphagia, GERD, heartburn.  Patient was most recently seen in the Gastroenterology Clinic on 11/08/2022 by me.  No interval change in medical history since that appointment. Please refer to that note for full details regarding GI history and clinical presentation.   Past Medical History:  Diagnosis Date   Acute prostatitis 02/04/2008   ATRIAL MYXOMA 02/04/2008   Closed fracture of four ribs 11/01/2009   HYPERLIPIDEMIA 02/04/2008   HYPERTENSION 02/04/2008   Osteoarthritis of right knee 04/18/2010    Past Surgical History:  Procedure Laterality Date   CATARACT EXTRACTION Left 08/03/2019   CORONARY ARTERY BYPASS GRAFT  2007   x2 Saph vein   COX-MAZE MICROWAVE ABLATION  2007   EXCISION OF ATRIAL MYXOMA  2007   Dr C. Roxy Manns    Prior to Admission medications   Medication Sig Start Date End Date Taking? Authorizing Provider  amLODipine (NORVASC) 2.5 MG tablet Take 1 tablet (2.5 mg total) by mouth daily. Patient taking differently: Take 5 mg by mouth daily. 12/12/21  Yes Marin Olp, MD  lisinopril (ZESTRIL) 20 MG tablet TAKE 1 TABLET(20 MG) BY MOUTH DAILY 06/06/22  Yes Marin Olp, MD  omeprazole (PRILOSEC) 40 MG capsule Take 1 capsule (40 mg total) by mouth daily. 10/29/22  Yes Marin Olp, MD  rosuvastatin (CRESTOR) 40 MG tablet TAKE 1 TABLET(40 MG) BY MOUTH DAILY 06/06/22  Yes Marin Olp, MD  aspirin EC 81 MG tablet Take 81 mg by mouth daily. Swallow whole.    [provider]    Current Outpatient Medications  Medication Sig Dispense Refill   amLODipine (NORVASC) 2.5 MG tablet Take 1 tablet (2.5 mg total) by mouth daily. (Patient taking differently: Take 5 mg by mouth daily.) 90 tablet 3   lisinopril (ZESTRIL) 20 MG tablet TAKE 1 TABLET(20 MG) BY MOUTH DAILY 90 tablet 3   omeprazole (PRILOSEC) 40 MG capsule Take 1 capsule (40 mg total) by mouth daily. 30 capsule 3   rosuvastatin (CRESTOR) 40 MG tablet TAKE 1 TABLET(40 MG) BY MOUTH DAILY 90 tablet 3   aspirin EC 81 MG tablet Take 81 mg by mouth daily. Swallow whole.     Current Facility-Administered Medications  Medication Dose Route Frequency Provider Last Rate Last Admin   0.9 %  sodium chloride infusion  500 mL Intravenous Once Adis Sturgill V, DO        Allergies as of 11/11/2022 - Review Complete 11/11/2022  Allergen Reaction Noted   Hydrocodone Palpitations 04/10/2015   Loratadine Other (See Comments) and Anxiety 12/07/2012    Family History  Problem Relation Age of Onset   Stroke Mother    Heart disease Father        MI 14   Hypertension Father    Ovarian cancer Sister     Social History   Socioeconomic History   Marital status: Married  Spouse name: Not on file   Number of children: Not on file   Years of education: Not on file   Highest education level: Not on file  Occupational History   Occupation: married  Tobacco Use   Smoking status: Never   Smokeless tobacco: Never  Substance and Sexual Activity   Alcohol use: No   Drug use: No   Sexual activity: Yes  Other Topics Concern   Not on file  Social History Narrative   Married will be 50 years in 2019 (wife seen at another practice). 2 children-daughter unmarried, professional student 39 and son 49 in 2016. 2 grandsons-17 and 17 in 2019.       Still works as Clinical biochemist, does his own carpentry work       Hobbies: fishing   Social Determinants of Radio broadcast assistant Strain: Outlook  (01/22/2022)   Overall Financial Resource Strain (CARDIA)    Difficulty of Paying Living Expenses: Not hard at all  Food Insecurity: No Food Insecurity (01/22/2022)   Hunger Vital Sign    Worried About Running Out of Food in the Last Year: Never true    [place] in the Last Year: Never true  Transportation Needs: No Transportation Needs (01/22/2022)   PRAPARE - Hydrologist (Medical): No    Lack of Transportation (Non-Medical): No  Physical Activity: Inactive (01/22/2022)   Exercise Vital Sign    Days of Exercise per Week: 0 days    Minutes of Exercise per Session: 0 min  Stress: No Stress Concern Present (01/22/2022)   [place]    Feeling of Stress : Not at all  Social Connections: Moderately Isolated (01/22/2022)   Social Connection and Isolation Panel [NHANES]    Frequency of Communication with Friends and Family: More than three times a week    Frequency of Social Gatherings with Friends and Family: More than three times a week    Attends Religious Services: Never    Marine scientist or Organizations: No    Attends Archivist Meetings: Never    Marital Status: Married  Human resources officer Violence: Not At Risk (01/22/2022)   Humiliation, Afraid, Rape, and Kick questionnaire    Fear of Current or Ex-Partner: No    Emotionally Abused: No    Physically Abused: No    Sexually Abused: No    Physical Exam: Vital signs in last 24 hours: '@BP'$  (!) 183/83 (BP Location: Left Arm, Patient Position: Sitting, Cuff Size: Normal)   Pulse 68   Temp (!) 97.3 F (36.3 C) (Temporal)   Ht '5\' 9"'$  (1.753 m)   Wt 172 lb (78 kg)   SpO2 98%   BMI 25.40 kg/m  GEN: NAD EYE: Sclerae anicteric ENT: MMM CV: Non-tachycardic Pulm: CTA b/l GI: Soft, NT/ND NEURO:  Alert & Oriented x 3   Gerrit Heck, DO [facility]   11/11/2022 9:13 AM

## 2022-11-11 NOTE — Progress Notes (Signed)
Sedate, gd SR, tolerated procedure well, VSS, report to RN 

## 2022-11-11 NOTE — Patient Instructions (Addendum)
Handouts on post dilation diet today and stricture given to patient. Await pathology results. Continue present medications - pick up prescriptions from [facility] for Prilosec (Omeprazole) 20 mg twice a day for 4 weeks, then reduce to 20 mg daily; Diflucan (Fluconazole) 200 mg X 1 day, then 100 mg daily for 3 weeks. Repeat upper endoscopy as needed for re-treatment. Return to GI clinic in 3-4 months, or sooner as needed.   YOU HAD AN ENDOSCOPIC PROCEDURE TODAY AT [facility]:   Refer to the procedure report that was given to you for any specific questions about what was found during the examination.  If the procedure report does not answer your questions, please call your gastroenterologist to clarify.  If you requested that your care partner not be given the details of your procedure findings, then the procedure report has been included in a sealed envelope for you to review at your convenience later.  YOU SHOULD EXPECT: Some feelings of bloating in the abdomen. Passage of more gas than usual.  Walking can help get rid of the air that was put into your GI tract during the procedure and reduce the bloating. If you had a lower endoscopy (such as a colonoscopy or flexible sigmoidoscopy) you may notice spotting of blood in your stool or on the toilet paper. If you underwent a bowel prep for your procedure, you may not have a normal bowel movement for a few days.  Please Note:  You might notice some irritation and congestion in your nose or some drainage.  This is from the oxygen used during your procedure.  There is no need for concern and it should clear up in a day or so.  SYMPTOMS TO REPORT IMMEDIATELY:   Following upper endoscopy (EGD)  Vomiting of blood or coffee ground material  New chest pain or pain under the shoulder blades  Painful or persistently difficult swallowing  New shortness of breath  Fever of 100F or higher  Black, tarry-looking stools  For urgent or  emergent issues, a gastroenterologist can be reached at any hour by calling [phone]. Do not use MyChart messaging for urgent concerns.    DIET:  We do recommend a small meal at first, but then you may proceed to your regular diet.  Drink plenty of fluids but you should avoid alcoholic beverages for 24 hours.  ACTIVITY:  You should plan to take it easy for the rest of today and you should NOT DRIVE or use heavy machinery until tomorrow (because of the sedation medicines used during the test).    FOLLOW UP: Our staff will call the number listed on your records the next business day following your procedure.  We will call around 7:15- 8:00 am to check on you and address any questions or concerns that you may have regarding the information given to you following your procedure. If we do not reach you, we will leave a message.     If any biopsies were taken you will be contacted by phone or by letter within the next 1-3 weeks.  Please call us at [phone] if you have not heard about the biopsies in 3 weeks.    SIGNATURES/CONFIDENTIALITY: You and/or your care partner have signed paperwork which will be entered into your electronic medical record.  These signatures attest to the fact that that the information above on your After Visit Summary has been reviewed and is understood.  Full responsibility of the confidentiality of this discharge information  lies with you and/or your care-partner.

## 2022-11-12 ENCOUNTER — Telehealth: Payer: Self-pay

## 2022-11-12 ENCOUNTER — Other Ambulatory Visit (INDEPENDENT_AMBULATORY_CARE_PROVIDER_SITE_OTHER): Payer: Medicare Other

## 2022-11-12 ENCOUNTER — Telehealth: Payer: Self-pay | Admitting: Gastroenterology

## 2022-11-12 DIAGNOSIS — N401 Enlarged prostate with lower urinary tract symptoms: Secondary | ICD-10-CM | POA: Diagnosis not present

## 2022-11-12 DIAGNOSIS — I10 Essential (primary) hypertension: Secondary | ICD-10-CM

## 2022-11-12 DIAGNOSIS — R351 Nocturia: Secondary | ICD-10-CM | POA: Diagnosis not present

## 2022-11-12 LAB — CBC WITH DIFFERENTIAL/PLATELET
Basophils Absolute: 0 10*3/uL (ref 0.0–0.1)
Basophils Relative: 0.6 % (ref 0.0–3.0)
Eosinophils Absolute: 0.6 10*3/uL (ref 0.0–0.7)
Eosinophils Relative: 9.7 % — ABNORMAL HIGH (ref 0.0–5.0)
HCT: 42.5 % (ref 39.0–52.0)
Hemoglobin: 14.7 g/dL (ref 13.0–17.0)
Lymphocytes Relative: 24.8 % (ref 12.0–46.0)
Lymphs Abs: 1.6 10*3/uL (ref 0.7–4.0)
MCHC: 34.6 g/dL (ref 30.0–36.0)
MCV: 90.7 fl (ref 78.0–100.0)
Monocytes Absolute: 0.5 10*3/uL (ref 0.1–1.0)
Monocytes Relative: 8 % (ref 3.0–12.0)
Neutro Abs: 3.6 10*3/uL (ref 1.4–7.7)
Neutrophils Relative %: 56.9 % (ref 43.0–77.0)
Platelets: 254 10*3/uL (ref 150.0–400.0)
RBC: 4.69 Mil/uL (ref 4.22–5.81)
RDW: 13.3 % (ref 11.5–15.5)
WBC: 6.4 10*3/uL (ref 4.0–10.5)

## 2022-11-12 LAB — COMPREHENSIVE METABOLIC PANEL
ALT: 15 U/L (ref 0–53)
AST: 18 U/L (ref 0–37)
Albumin: 4.5 g/dL (ref 3.5–5.2)
Alkaline Phosphatase: 45 U/L (ref 39–117)
BUN: 10 mg/dL (ref 6–23)
CO2: 29 mEq/L (ref 19–32)
Calcium: 9.9 mg/dL (ref 8.4–10.5)
Chloride: 101 mEq/L (ref 96–112)
Creatinine, Ser: 1.03 mg/dL (ref 0.40–1.50)
GFR: 71.79 mL/min (ref >60.00)
Glucose, Bld: 102 mg/dL — ABNORMAL HIGH (ref 70–99)
Potassium: 4 mEq/L (ref 3.5–5.1)
Sodium: 137 mEq/L (ref 135–145)
Total Bilirubin: 1 mg/dL (ref 0.2–1.2)
Total Protein: 7.4 g/dL (ref 6.0–8.3)

## 2022-11-12 LAB — PSA: PSA: 3.74 ng/mL (ref 0.10–4.00)

## 2022-11-12 LAB — LIPID PANEL
Cholesterol: 222 mg/dL — ABNORMAL HIGH (ref 0–200)
HDL: 51.2 mg/dL (ref >39.00)
LDL Cholesterol: 151 mg/dL — ABNORMAL HIGH (ref 0–99)
NonHDL: 170.3
Total CHOL/HDL Ratio: 4
Triglycerides: 97 mg/dL (ref 0.0–149.0)
VLDL: 19.4 mg/dL (ref 0.0–40.0)

## 2022-11-12 NOTE — Telephone Encounter (Signed)
  Follow up Call-     11/11/2022    8:13 AM 11/11/2022    8:09 AM  Call back number  Post procedure Call Back phone  # [phone]   Permission to leave phone message  Yes     Patient questions:  Do you have a fever, pain , or abdominal swelling? No. Pain Score  0 *  Have you tolerated food without any problems? Yes.    Have you been able to return to your normal activities? Yes.    Do you have any questions about your discharge instructions: Diet   No. Medications  No. Follow up visit  No.  Do you have questions or concerns about your Care? No.  Actions: * If pain score is 4 or above: No action needed, pain <4.

## 2022-11-12 NOTE — Telephone Encounter (Signed)
Patient's pharmacy is calling states Diflucan brand name is not available but the generic is. Please advise

## 2022-11-12 NOTE — Telephone Encounter (Signed)
Called the pharmacy to let them know that generic of Diflucan is ok to refill.

## 2022-11-13 ENCOUNTER — Other Ambulatory Visit: Payer: Self-pay

## 2022-11-13 DIAGNOSIS — R972 Elevated prostate specific antigen [PSA]: Secondary | ICD-10-CM

## 2022-11-15 ENCOUNTER — Encounter: Payer: Self-pay | Admitting: Gastroenterology

## 2022-12-06 ENCOUNTER — Other Ambulatory Visit: Payer: Self-pay | Admitting: Family Medicine

## 2023-01-27 ENCOUNTER — Encounter: Payer: Self-pay | Admitting: Gastroenterology

## 2023-02-27 ENCOUNTER — Other Ambulatory Visit: Payer: Self-pay | Admitting: Gastroenterology

## 2023-02-27 DIAGNOSIS — K219 Gastro-esophageal reflux disease without esophagitis: Secondary | ICD-10-CM

## 2023-02-27 DIAGNOSIS — K222 Esophageal obstruction: Secondary | ICD-10-CM

## 2023-02-27 DIAGNOSIS — K449 Diaphragmatic hernia without obstruction or gangrene: Secondary | ICD-10-CM

## 2023-02-27 DIAGNOSIS — R1310 Dysphagia, unspecified: Secondary | ICD-10-CM

## 2023-02-27 DIAGNOSIS — B3781 Candidal esophagitis: Secondary | ICD-10-CM

## 2023-03-12 ENCOUNTER — Telehealth: Payer: Self-pay | Admitting: Family Medicine

## 2023-03-12 NOTE — Telephone Encounter (Signed)
Contacted Ethan Payne to schedule their annual wellness visit. Appointment made for 03/24/2023.  [facility] TE[facility] Direct Dial [phone]

## 2023-03-24 ENCOUNTER — Other Ambulatory Visit: Payer: Self-pay | Admitting: Family Medicine

## 2023-03-24 ENCOUNTER — Ambulatory Visit (INDEPENDENT_AMBULATORY_CARE_PROVIDER_SITE_OTHER): Payer: Medicare Other

## 2023-03-24 VITALS — BP 168/100 | HR 93 | Temp 98.5°F | Wt 174.6 lb

## 2023-03-24 DIAGNOSIS — Z0000 Encounter for general adult medical examination without abnormal findings: Secondary | ICD-10-CM

## 2023-03-24 MED ORDER — AMLODIPINE BESYLATE 5 MG PO TABS: 5.0000 mg | ORAL_TABLET | Freq: Every day | ORAL | 3 refills | Status: DC

## 2023-03-24 NOTE — Progress Notes (Addendum)
Subjective:   Ethan Payne is a 77 y.o. male who presents for Medicare Annual/Subsequent preventive examination.  Review of Systems     Cardiac Risk Factors include: advanced age (>73men, >48 women);dyslipidemia;male gender;hypertension     Objective:    Today's Vitals   03/24/23 1332 03/24/23 1343 03/24/23 1406  BP: (!) 158/100  (!) 168/100  Pulse: 93    Temp: 98.5 F (36.9 C)    SpO2: 98%    Weight: 174 lb 9.6 oz (79.2 kg)    PainSc:  7     Body mass index is 25.78 kg/m.     03/24/2023    1:54 PM 01/22/2022    8:09 AM 10/06/2019    3:36 PM 04/22/2016   11:10 AM  Advanced Directives  Does Patient Have a Medical Advance Directive? No No No No  Would patient like information on creating a medical advance directive? No - Patient declined Yes (MAU/Ambulatory/Procedural Areas - Information given) No - Patient declined No - patient declined information    Current Medications (verified) Outpatient Encounter Medications as of 03/24/2023  Medication Sig   amLODipine (NORVASC) 2.5 MG tablet TAKE 1 TABLET(2.5 MG) BY MOUTH DAILY   lisinopril (ZESTRIL) 20 MG tablet TAKE 1 TABLET(20 MG) BY MOUTH DAILY   aspirin EC 81 MG tablet Take 81 mg by mouth daily. Swallow whole. (Patient not taking: Reported on 03/24/2023)   omeprazole (PRILOSEC) 20 MG capsule TAKE 1 CAPSULE BY MOUTH TWICE DAILY BEFORE A MEAL FOR 4 WEEKS, THEN REDUCE TO 1 CAPSULE BY MOUTH DAILY THEREAFTER (Patient not taking: Reported on 03/24/2023)   rosuvastatin (CRESTOR) 40 MG tablet TAKE 1 TABLET(40 MG) BY MOUTH DAILY (Patient not taking: Reported on 03/24/2023)   [DISCONTINUED] DIFLUCAN 100 MG tablet Diflucan (fluconazole) 200 mg x 1 day, then 100 mg PO daily for 3 weeks.   [DISCONTINUED] omeprazole (PRILOSEC) 40 MG capsule Take 1 capsule (40 mg total) by mouth daily.   No facility-administered encounter medications on file as of 03/24/2023.    Allergies (verified) Hydrocodone and Loratadine   History: Past Medical  History:  Diagnosis Date   Acute prostatitis 02/04/2008   ATRIAL MYXOMA 02/04/2008   Closed fracture of four ribs 11/01/2009   HYPERLIPIDEMIA 02/04/2008   HYPERTENSION 02/04/2008   Osteoarthritis of right knee 04/18/2010   Past Surgical History:  Procedure Laterality Date   CATARACT EXTRACTION Left 08/03/2019   CORONARY ARTERY BYPASS GRAFT  2007   x2 Saph vein   COX-MAZE MICROWAVE ABLATION  2007   EXCISION OF ATRIAL MYXOMA  2007   Dr C. Cornelius Moras   Family History  Problem Relation Age of Onset   Stroke Mother    Heart disease Father        MI 8   Hypertension Father    Ovarian cancer Sister    Social History   Socioeconomic History   Marital status: Married    Spouse name: Not on file   Number of children: Not on file   Years of education: Not on file   Highest education level: Not on file  Occupational History   Occupation: married  Tobacco Use   Smoking status: Never   Smokeless tobacco: Never  Substance and Sexual Activity   Alcohol use: No   Drug use: No   Sexual activity: Yes  Other Topics Concern   Not on file  Social History Narrative   Married will be 50 years in 2019 (wife seen at another practice). 2 children-daughter unmarried, professional  student 73 and son 40 in 2016. 2 grandsons-17 and 11 in 2019.       Still works as Product/process development scientist, does his own carpentry work      Hobbies: fishing   Social Determinants of Corporate investment banker Strain: Low Risk  (03/24/2023)   Overall Financial Resource Strain (CARDIA)    Difficulty of Paying Living Expenses: Not hard at all  Food Insecurity: No Food Insecurity (03/24/2023)   Hunger Vital Sign    Worried About Running Out of Food in the Last Year: Never true    Ran Out of Food in the Last Year: Never true  Transportation Needs: No Transportation Needs (03/24/2023)   PRAPARE - Administrator, Civil Service (Medical): No    Lack of Transportation (Non-Medical): No  Physical Activity: Insufficiently  Active (03/24/2023)   Exercise Vital Sign    Days of Exercise per Week: 3 days    Minutes of Exercise per Session: 30 min  Stress: No Stress Concern Present (03/24/2023)   [employer] of Occupational Health - Occupational Stress Questionnaire    Feeling of Stress : Not at all  Social Connections: Moderately Isolated (03/24/2023)   Social Connection and Isolation Panel [NHANES]    Frequency of Communication with Friends and Family: More than three times a week    Frequency of Social Gatherings with Friends and Family: More than three times a week    Attends Religious Services: Never    Database administrator or Organizations: No    Attends Engineer, structural: Never    Marital Status: Married    Tobacco Counseling Counseling given: Not Answered   Clinical Intake:  Pre-visit preparation completed: Yes  Pain : 0-10 Pain Score: 7  Pain Type: Chronic pain Pain Location: Knee Pain Orientation: Right Pain Descriptors / Indicators: Aching Pain Onset: More than a month ago Pain Frequency: Intermittent     BMI - recorded: 25.78 Nutritional Status: BMI 25 -29 Overweight Nutritional Risks: None Diabetes: No  How often do you need to have someone help you when you read instructions, pamphlets, or other written materials from your doctor or pharmacy?: 1 - Never  Diabetic?no  Interpreter Needed?: No  Information entered by :: Lanier Ensign, LPN   Activities of Daily Living    03/24/2023    1:55 PM  In your present state of health, do you have any difficulty performing the following activities:  Hearing? 1  Comment pt wears hearing aids  Vision? 0  Difficulty concentrating or making decisions? 0  Walking or climbing stairs? 0  Dressing or bathing? 0  Doing errands, shopping? 0  Preparing Food and eating ? N  Using the Toilet? N  In the past six months, have you accidently leaked urine? N  Do you have problems with loss of bowel control? N  Managing your  Medications? N  Managing your Finances? N  Housekeeping or managing your Housekeeping? N    Patient Care Team: Shelva Majestic, MD as PCP - General (Family Medicine) Karie Soda, MD as Consulting Physician (General Surgery) Crista Elliot, MD as Consulting Physician (Urology)  Indicate any recent Medical Services you may have received from other than [facility] providers in the past year (date may be approximate).     Assessment:   This is a routine wellness examination for [place].  Hearing/Vision screen Hearing Screening - Comments:: Pt wears hearing aids  Vision Screening - Comments:: Pt follows up with Dr  Clelia Croft for annual eye exams   Dietary issues and exercise activities discussed: Current Exercise Habits: Home exercise routine, Type of exercise: walking, Time (Minutes): 30, Frequency (Times/Week): 3, Weekly Exercise (Minutes/Week): 90   Goals Addressed             This Visit's Progress    Patient Stated       Get blood pressure down        Depression Screen    03/24/2023    1:53 PM 01/22/2022    8:08 AM 10/06/2019    3:37 PM 08/24/2018    8:43 AM 08/21/2017    9:13 AM 04/22/2016   11:10 AM 02/17/2015    9:08 AM  PHQ 2/9 Scores  PHQ - 2 Score 0 0 0 0 0 0 0    Fall Risk    03/24/2023    1:55 PM 01/22/2022    8:10 AM 10/06/2019    3:37 PM 08/24/2018    8:43 AM 08/21/2017    9:13 AM  Fall Risk   Falls in the past year? 0 0 0 Yes No  Number falls in past yr: 0 0  1   Injury with Fall? 0 0 0 Yes   Risk for fall due to : Impaired vision Impaired vision     Follow up Falls prevention discussed Falls prevention discussed Falls evaluation completed;Education provided;Falls prevention discussed      FALL RISK PREVENTION PERTAINING TO THE HOME:  Any stairs in or around the home? Yes  If so, are there any without handrails? No  Home free of loose throw rugs in walkways, pet beds, electrical cords, etc? Yes  Adequate lighting in your home to reduce risk of falls? Yes    ASSISTIVE DEVICES UTILIZED TO PREVENT FALLS:  Life alert? No  Use of a cane, walker or w/c? No  Grab bars in the bathroom? No  Shower chair or bench in shower? No  Elevated toilet seat or a handicapped toilet? No   TIMED UP AND GO:  Was the test performed? Yes .  Length of time to ambulate 10 feet: 10 sec.   Gait steady and fast without use of assistive device  Cognitive Function:        03/24/2023    1:59 PM 10/06/2019    3:38 PM  6CIT Screen  What Year? 0 points 0 points  What month? 0 points 0 points  What time? 0 points 0 points  Count back from 20 0 points 0 points  Months in reverse 0 points 0 points  Repeat phrase 0 points 0 points  Total Score 0 points 0 points    Immunizations Immunization History  Administered Date(s) Administered   Influenza Whole 09/01/2009   Influenza,inj,Quad PF,6+ Mos 01/14/2014   Pneumococcal Polysaccharide-23 10/03/2009   Td 05/05/2009   Tdap 08/01/2014    TDAP status: Up to date  Flu Vaccine status: Declined, Education has been provided regarding the importance of this vaccine but patient still declined. Advised may receive this vaccine at local pharmacy or Health Dept. Aware to provide a copy of the vaccination record if obtained from local pharmacy or Health Dept. Verbalized acceptance and understanding.    Covid-19 vaccine status: Declined, Education has been provided regarding the importance of this vaccine but patient still declined. Advised may receive this vaccine at local pharmacy or Health Dept.or vaccine clinic. Aware to provide a copy of the vaccination record if obtained from local pharmacy or Health Dept. Verbalized acceptance and understanding.  Qualifies for Shingles Vaccine? No    Screening Tests Health Maintenance  Topic Date Due   Medicare Annual Wellness (AWV)  03/23/2024   Fecal DNA (Cologuard)  05/23/2024   DTaP/Tdap/Td (3 - Td or Tdap) 08/01/2024   Hepatitis C Screening  Completed   HPV VACCINES   Aged Out   Pneumonia Vaccine 75+ Years old  Discontinued   INFLUENZA VACCINE  Discontinued   COVID-19 Vaccine  Discontinued   Zoster Vaccines- Shingrix  Discontinued    Health Maintenance  There are no preventive care reminders to display for this patient.   Colorectal cancer screening: Type of screening: Cologuard. Completed 05/23/21. Repeat every 3 years   Additional Screening:  Hepatitis C Screening:  Completed 04/22/16  Vision Screening: Recommended annual ophthalmology exams for early detection of glaucoma and other disorders of the eye. Is the patient up to date with their annual eye exam?  Yes  Who is the provider or what is the name of the office in which the patient attends annual eye exams? Dr Clelia Croft  If pt is not established with a provider, would they like to be referred to a provider to establish care? No .   Dental Screening: Recommended annual dental exams for proper oral hygiene  Community Resource Referral / Chronic Care Management: CRR required this visit?  No   CCM required this visit?  No      Plan:     I have personally reviewed and noted the following in the patient's chart:   Medical and social history Use of alcohol, tobacco or illicit drugs  Current medications and supplements including opioid prescriptions. Patient is not currently taking opioid prescriptions. Functional ability and status Nutritional status Physical activity Advanced directives List of other physicians Hospitalizations, surgeries, and ER visits in previous 12 months Vitals Screenings to include cognitive, depression, and falls Referrals and appointments  In addition, I have reviewed and discussed with patient certain preventive protocols, quality metrics, and best practice recommendations. A written personalized care plan for preventive services as well as general preventive health recommendations were provided to patient.     Marzella Schlein, LPN   05/05/5408   Nurse  Notes: Pt blood pressure was 158/100 x2 left arm and 3rd blood pressure was 168/100 right  arm , pt remained alert and oriented denies any chest pain  or SOB stated he has been having knee pain only, working on getting him an appt to discuss blood pressure medications pt  has stopped Crestor and omeprazole. Pt given blood pressure chart to monitor for when he comes in for appt

## 2023-03-24 NOTE — Patient Instructions (Signed)
Ethan Payne , Thank you for taking time to come for your Medicare Wellness Visit. I appreciate your ongoing commitment to your health goals. Please review the following plan we discussed and let me know if I can assist you in the future.   These are the goals we discussed:  Goals      Patient Stated     None at this time        This is a list of the screening recommended for you and due dates:  Health Maintenance  Topic Date Due   Medicare Annual Wellness Visit  01/22/2023   Cologuard (Stool DNA test)  05/23/2024   DTaP/Tdap/Td vaccine (3 - Td or Tdap) 08/01/2024   Hepatitis C Screening: USPSTF Recommendation to screen - Ages 65-79 yo.  Completed   HPV Vaccine  Aged Out   Pneumonia Vaccine  Discontinued   Flu Shot  Discontinued   COVID-19 Vaccine  Discontinued   Zoster (Shingles) Vaccine  Discontinued    Advanced directives: Advance directive discussed with you today. Even though you declined this today please call our office should you change your mind and we can give you the proper paperwork for you to fill out.   Conditions/risks identified: get blood pressure down   Next appointment: Follow up in one year for your annual wellness visit.   Preventive Care 38 Years and Older, Male  Preventive care refers to lifestyle choices and visits with your health care provider that can promote health and wellness. What does preventive care include? A yearly physical exam. This is also called an annual well check. Dental exams once or twice a year. Routine eye exams. Ask your health care provider how often you should have your eyes checked. Personal lifestyle choices, including: Daily care of your teeth and gums. Regular physical activity. Eating a healthy diet. Avoiding tobacco and drug use. Limiting alcohol use. Practicing safe sex. Taking low doses of aspirin every day. Taking vitamin and mineral supplements as recommended by your health care provider. What happens during an  annual well check? The services and screenings done by your health care provider during your annual well check will depend on your age, overall health, lifestyle risk factors, and family history of disease. Counseling  Your health care provider may ask you questions about your: Alcohol use. Tobacco use. Drug use. Emotional well-being. Home and relationship well-being. Sexual activity. Eating habits. History of falls. Memory and ability to understand (cognition). Work and work Astronomer. Screening  You may have the following tests or measurements: Height, weight, and BMI. Blood pressure. Lipid and cholesterol levels. These may be checked every 5 years, or more frequently if you are over 93 years old. Skin check. Lung cancer screening. You may have this screening every year starting at age 75 if you have a 30-pack-year history of smoking and currently smoke or have quit within the past 15 years. Fecal occult blood test (FOBT) of the stool. You may have this test every year starting at age 49. Flexible sigmoidoscopy or colonoscopy. You may have a sigmoidoscopy every 5 years or a colonoscopy every 10 years starting at age 13. Prostate cancer screening. Recommendations will vary depending on your family history and other risks. Hepatitis C blood test. Hepatitis B blood test. Sexually transmitted disease (STD) testing. Diabetes screening. This is done by checking your blood sugar (glucose) after you have not eaten for a while (fasting). You may have this done every 1-3 years. Abdominal aortic aneurysm (AAA) screening. You may  need this if you are a current or former smoker. Osteoporosis. You may be screened starting at age 73 if you are at high risk. Talk with your health care provider about your test results, treatment options, and if necessary, the need for more tests. Vaccines  Your health care provider may recommend certain vaccines, such as: Influenza vaccine. This is recommended  every year. Tetanus, diphtheria, and acellular pertussis (Tdap, Td) vaccine. You may need a Td booster every 10 years. Zoster vaccine. You may need this after age 31. Pneumococcal 13-valent conjugate (PCV13) vaccine. One dose is recommended after age 39. Pneumococcal polysaccharide (PPSV23) vaccine. One dose is recommended after age 69. Talk to your health care provider about which screenings and vaccines you need and how often you need them. This information is not intended to replace advice given to you by your health care provider. Make sure you discuss any questions you have with your health care provider. Document Released: 12/15/2015 Document Revised: 08/07/2016 Document Reviewed: 09/19/2015 Elsevier Interactive Patient Education  2017 [place] Prevention in the Home Falls can cause injuries. They can happen to people of all ages. There are many things you can do to make your home safe and to help prevent falls. What can I do on the outside of my home? Regularly fix the edges of walkways and driveways and fix any cracks. Remove anything that might make you trip as you walk through a door, such as a raised step or threshold. Trim any bushes or trees on the path to your home. Use bright outdoor lighting. Clear any walking paths of anything that might make someone trip, such as rocks or tools. Regularly check to see if handrails are loose or broken. Make sure that both sides of any steps have handrails. Any raised decks and porches should have guardrails on the edges. Have any leaves, snow, or ice cleared regularly. Use sand or salt on walking paths during winter. Clean up any spills in your garage right away. This includes oil or grease spills. What can I do in the bathroom? Use night lights. Install grab bars by the toilet and in the tub and shower. Do not use towel bars as grab bars. Use non-skid mats or decals in the tub or shower. If you need to sit down in the shower,  use a plastic, non-slip stool. Keep the floor dry. Clean up any water that spills on the floor as soon as it happens. Remove soap buildup in the tub or shower regularly. Attach bath mats securely with double-sided non-slip rug tape. Do not have throw rugs and other things on the floor that can make you trip. What can I do in the bedroom? Use night lights. Make sure that you have a light by your bed that is easy to reach. Do not use any sheets or blankets that are too big for your bed. They should not hang down onto the floor. Have a firm chair that has side arms. You can use this for support while you get dressed. Do not have throw rugs and other things on the floor that can make you trip. What can I do in the kitchen? Clean up any spills right away. Avoid walking on wet floors. Keep items that you use a lot in easy-to-reach places. If you need to reach something above you, use a strong step stool that has a grab bar. Keep electrical cords out of the way. Do not use floor polish or wax that makes  floors slippery. If you must use wax, use non-skid floor wax. Do not have throw rugs and other things on the floor that can make you trip. What can I do with my stairs? Do not leave any items on the stairs. Make sure that there are handrails on both sides of the stairs and use them. Fix handrails that are broken or loose. Make sure that handrails are as long as the stairways. Check any carpeting to make sure that it is firmly attached to the stairs. Fix any carpet that is loose or worn. Avoid having throw rugs at the top or bottom of the stairs. If you do have throw rugs, attach them to the floor with carpet tape. Make sure that you have a light switch at the top of the stairs and the bottom of the stairs. If you do not have them, ask someone to add them for you. What else can I do to help prevent falls? Wear shoes that: Do not have high heels. Have rubber bottoms. Are comfortable and fit you  well. Are closed at the toe. Do not wear sandals. If you use a stepladder: Make sure that it is fully opened. Do not climb a closed stepladder. Make sure that both sides of the stepladder are locked into place. Ask someone to hold it for you, if possible. Clearly mark and make sure that you can see: Any grab bars or handrails. First and last steps. Where the edge of each step is. Use tools that help you move around (mobility aids) if they are needed. These include: Canes. Walkers. Scooters. Crutches. Turn on the lights when you go into a dark area. Replace any light bulbs as soon as they burn out. Set up your furniture so you have a clear path. Avoid moving your furniture around. If any of your floors are uneven, fix them. If there are any pets around you, be aware of where they are. Review your medicines with your doctor. Some medicines can make you feel dizzy. This can increase your chance of falling. Ask your doctor what other things that you can do to help prevent falls. This information is not intended to replace advice given to you by your health care provider. Make sure you discuss any questions you have with your health care provider. Document Released: 09/14/2009 Document Revised: 04/25/2016 Document Reviewed: 12/23/2014 Elsevier Interactive Patient Education  2017 [employer].

## 2023-03-31 DIAGNOSIS — H35372 Puckering of macula, left eye: Secondary | ICD-10-CM | POA: Diagnosis not present

## 2023-03-31 DIAGNOSIS — H35371 Puckering of macula, right eye: Secondary | ICD-10-CM | POA: Diagnosis not present

## 2023-04-07 ENCOUNTER — Encounter: Payer: Self-pay | Admitting: Family Medicine

## 2023-04-07 ENCOUNTER — Ambulatory Visit (INDEPENDENT_AMBULATORY_CARE_PROVIDER_SITE_OTHER): Payer: Medicare Other | Admitting: Family Medicine

## 2023-04-07 VITALS — BP 158/86 | HR 82 | Temp 98.2°F | Resp 16 | Ht 69.0 in | Wt 173.6 lb

## 2023-04-07 DIAGNOSIS — I10 Essential (primary) hypertension: Secondary | ICD-10-CM | POA: Diagnosis not present

## 2023-04-07 DIAGNOSIS — I480 Paroxysmal atrial fibrillation: Secondary | ICD-10-CM | POA: Diagnosis not present

## 2023-04-07 MED ORDER — SILDENAFIL CITRATE 100 MG PO TABS: 100.0000 mg | ORAL_TABLET | Freq: Every day | ORAL | 11 refills | Status: DC | PRN

## 2023-04-07 MED ORDER — LISINOPRIL-HYDROCHLOROTHIAZIDE 20-12.5 MG PO TABS: 1.0000 | ORAL_TABLET | Freq: Every day | ORAL | 5 refills | Status: DC

## 2023-04-07 NOTE — Patient Instructions (Addendum)
history of gastroesophageal reflux disease with need for prior dilation- we discussed possibly going back to 40 mg omeprazole vs staying on Tums alone- he wants to think over these options and let me know   Trial Voltaren gel/diclofenac 4x a day for 2 weeks to see if it will calm down the knee pain without as many side effects as the advil (advil can raise blood pressure )   blood pressure still above goal -continue amlodipine 5 mg -change lisinopril 20 mg to lisinopril- hydrochlorothiazide 20-12.5 mg and have him update me in about 3 weeks by [employer] with half tablet trial of Viagra -but wait until blood pressure closer to 140/90 regularly before trying this and stop and seek care if any chest pain or shortness of breath   Recommended follow up: Return in about 2 months (around 06/07/2023) for followup or sooner if needed.Schedule b4 you leave.

## 2023-04-07 NOTE — Progress Notes (Signed)
Phone [phone] In person visit   Subjective:   Ethan Payne is a 77 y.o. year old very pleasant male patient who presents for/with See problem oriented charting Chief Complaint  Patient presents with   Hypertension    Brought BP log in    Past Medical History-  Patient Active Problem List   Diagnosis Date Noted   Paroxysmal atrial fibrillation (HCC) 12/21/2021    Priority: High   CAD (coronary artery disease) 02/17/2015    Priority: High   s/p excision of Atrial myxoma 02/04/2008    Priority: High   BPH associated with nocturia 04/10/2015    Priority: Medium    Hyperlipidemia 02/04/2008    Priority: Medium    Essential hypertension 02/04/2008    Priority: Medium    Leg length difference, acquired 01/07/2018    Priority: Low   Rotator cuff syndrome of right shoulder 12/08/2017    Priority: Low   Health care maintenance 02/18/2015    Priority: Low   Erectile dysfunction 02/18/2015    Priority: Low   Heel pain 03/22/2011    Priority: Low   GERD (gastroesophageal reflux disease) 03/22/2011    Priority: Low   Osteoarthritis of right knee 04/18/2010    Priority: Low    Medications- reviewed and updated Current Outpatient Medications  Medication Sig Dispense Refill   amLODipine (NORVASC) 5 MG tablet Take 1 tablet (5 mg total) by mouth daily. 90 tablet 3   aspirin EC 81 MG tablet Take 81 mg by mouth daily. Swallow whole.     lisinopril-hydrochlorothiazide (ZESTORETIC) 20-12.5 MG tablet Take 1 tablet by mouth daily. 30 tablet 5   omeprazole (PRILOSEC) 20 MG capsule TAKE 1 CAPSULE BY MOUTH TWICE DAILY BEFORE A MEAL FOR 4 WEEKS, THEN REDUCE TO 1 CAPSULE BY MOUTH DAILY THEREAFTER 90 capsule 1   rosuvastatin (CRESTOR) 40 MG tablet TAKE 1 TABLET(40 MG) BY MOUTH DAILY 90 tablet 3   sildenafil (VIAGRA) 100 MG tablet Take 1 tablet (100 mg total) by mouth daily as needed for erectile dysfunction. 10 tablet 11   No current facility-administered medications for this visit.      Objective:  BP (!) 158/86   Pulse 82   Temp 98.2 F (36.8 C) (Temporal)   Resp 16   Ht 5\' 9"  (1.753 m)   Wt 173 lb 9.6 oz (78.7 kg)   SpO2 97%   BMI 25.64 kg/m  Gen: NAD, resting comfortably CV: RRR no murmurs rubs or gallops Lungs: CTAB no crackles, wheeze, rhonchi Ext: no edema Skin: warm, dry     Assessment and Plan   #hypertension S: medication: Amlodipine 5 mg (dizziness was worse on 10 mg) , lisinopril 20 mg Home readings #s: Home readings over the last week ranging from 130s to 160s over low 90s to 100s -pain in knee bothering him which could be contributing- had been some advil up to 4x a day. Taking some advil which could contribute - no chest pain or shortness of breath (unless really pushes himself) reported or headache(s)  BP Readings from Last 3 Encounters:  04/07/23 (!) 158/86  03/24/23 (!) 168/100  11/11/22 (!) 147/72  A/P: blood pressure still above goal -continue amlodipine 5 mg -change lisinopril 20 mg to lisinopril- hydrochlorothiazide 20-12.5 mg and have him update me in about 3 weeks by mychart  # GERD- history of dilation in 2023- very helpful S:Medication:  omeprazole 20 mg was not controlling symptoms- he ended up stopping it and taking 2 Tums a  day seems more effective. 40 mg did control symptoms B12 levels related to PPI use: no recent checks  A/P: history of gastroesophageal reflux disease with need for prior dilation- we discussed possibly going back to 40 mg omeprazole vs staying on Tums alone- he wants to think over these options and let me know  -taking some advil which could contribute  # Paroxysmal atrial fibrillation-Remote at time of atrial myxoma removal-none since that time around 2010- no recurrence  #osteoarthritis right knee- wants to consider injection when blood pressure better controlled   #some recent cough/congestion- Nyquil helping- wants to monitor  Recommended follow up: Return in about 2 months (around 06/07/2023) for  followup or sooner if needed.Schedule b4 you leave. Future Appointments  Date Time Provider Department Center  03/29/2024  1:00 PM [facility] ANNUAL WELLNESS VISIT 1 [facility]    Lab/Order associations:   ICD-10-CM   1. Essential hypertension  I10     2. Paroxysmal atrial fibrillation (HCC)  I48.0       Meds ordered this encounter  Medications   lisinopril-hydrochlorothiazide (ZESTORETIC) 20-12.5 MG tablet    Sig: Take 1 tablet by mouth daily.    Dispense:  30 tablet    Refill:  5   sildenafil (VIAGRA) 100 MG tablet    Sig: Take 1 tablet (100 mg total) by mouth daily as needed for erectile dysfunction.    Dispense:  10 tablet    Refill:  11    Return precautions advised.  Tana Conch, MD

## 2023-04-21 ENCOUNTER — Inpatient Hospital Stay (HOSPITAL_COMMUNITY)
Admission: EM | Admit: 2023-04-21 | Discharge: 2023-04-24 | DRG: 062 | Disposition: A | Discharge status: Rehabilitation Facility | Payer: Medicare Other | Attending: Neurology | Admitting: Neurology

## 2023-04-21 ENCOUNTER — Encounter (HOSPITAL_COMMUNITY): Payer: Self-pay | Admitting: Emergency Medicine

## 2023-04-21 ENCOUNTER — Other Ambulatory Visit: Payer: Self-pay

## 2023-04-21 ENCOUNTER — Inpatient Hospital Stay (HOSPITAL_COMMUNITY): Payer: Medicare Other

## 2023-04-21 ENCOUNTER — Emergency Department (HOSPITAL_COMMUNITY): Payer: Medicare Other

## 2023-04-21 DIAGNOSIS — R29704 NIHSS score 4: Secondary | ICD-10-CM | POA: Diagnosis present

## 2023-04-21 DIAGNOSIS — I69392 Facial weakness following cerebral infarction: Secondary | ICD-10-CM | POA: Diagnosis not present

## 2023-04-21 DIAGNOSIS — I6389 Other cerebral infarction: Secondary | ICD-10-CM | POA: Diagnosis not present

## 2023-04-21 DIAGNOSIS — M75101 Unspecified rotator cuff tear or rupture of right shoulder, not specified as traumatic: Secondary | ICD-10-CM | POA: Diagnosis not present

## 2023-04-21 DIAGNOSIS — K219 Gastro-esophageal reflux disease without esophagitis: Secondary | ICD-10-CM | POA: Diagnosis present

## 2023-04-21 DIAGNOSIS — I69992 Facial weakness following unspecified cerebrovascular disease: Secondary | ICD-10-CM | POA: Diagnosis not present

## 2023-04-21 DIAGNOSIS — E860 Dehydration: Secondary | ICD-10-CM | POA: Diagnosis not present

## 2023-04-21 DIAGNOSIS — I63512 Cerebral infarction due to unspecified occlusion or stenosis of left middle cerebral artery: Principal | ICD-10-CM | POA: Diagnosis present

## 2023-04-21 DIAGNOSIS — Z7982 Long term (current) use of aspirin: Secondary | ICD-10-CM

## 2023-04-21 DIAGNOSIS — Z8249 Family history of ischemic heart disease and other diseases of the circulatory system: Secondary | ICD-10-CM | POA: Diagnosis not present

## 2023-04-21 DIAGNOSIS — Z79899 Other long term (current) drug therapy: Secondary | ICD-10-CM | POA: Diagnosis not present

## 2023-04-21 DIAGNOSIS — M4802 Spinal stenosis, cervical region: Secondary | ICD-10-CM | POA: Diagnosis not present

## 2023-04-21 DIAGNOSIS — M1711 Unilateral primary osteoarthritis, right knee: Secondary | ICD-10-CM | POA: Diagnosis present

## 2023-04-21 DIAGNOSIS — R270 Ataxia, unspecified: Secondary | ICD-10-CM

## 2023-04-21 DIAGNOSIS — R5381 Other malaise: Secondary | ICD-10-CM | POA: Diagnosis not present

## 2023-04-21 DIAGNOSIS — I480 Paroxysmal atrial fibrillation: Secondary | ICD-10-CM | POA: Diagnosis present

## 2023-04-21 DIAGNOSIS — G8191 Hemiplegia, unspecified affecting right dominant side: Secondary | ICD-10-CM | POA: Diagnosis not present

## 2023-04-21 DIAGNOSIS — R29810 Facial weakness: Secondary | ICD-10-CM | POA: Diagnosis not present

## 2023-04-21 DIAGNOSIS — Z885 Allergy status to narcotic agent status: Secondary | ICD-10-CM | POA: Diagnosis not present

## 2023-04-21 DIAGNOSIS — E785 Hyperlipidemia, unspecified: Secondary | ICD-10-CM | POA: Diagnosis not present

## 2023-04-21 DIAGNOSIS — R531 Weakness: Secondary | ICD-10-CM | POA: Diagnosis not present

## 2023-04-21 DIAGNOSIS — Z951 Presence of aortocoronary bypass graft: Secondary | ICD-10-CM | POA: Diagnosis not present

## 2023-04-21 DIAGNOSIS — E119 Type 2 diabetes mellitus without complications: Secondary | ICD-10-CM | POA: Diagnosis not present

## 2023-04-21 DIAGNOSIS — I69351 Hemiplegia and hemiparesis following cerebral infarction affecting right dominant side: Secondary | ICD-10-CM | POA: Diagnosis not present

## 2023-04-21 DIAGNOSIS — G459 Transient cerebral ischemic attack, unspecified: Secondary | ICD-10-CM | POA: Diagnosis not present

## 2023-04-21 DIAGNOSIS — Z823 Family history of stroke: Secondary | ICD-10-CM

## 2023-04-21 DIAGNOSIS — Z888 Allergy status to other drugs, medicaments and biological substances status: Secondary | ICD-10-CM | POA: Diagnosis not present

## 2023-04-21 DIAGNOSIS — I10 Essential (primary) hypertension: Secondary | ICD-10-CM | POA: Diagnosis not present

## 2023-04-21 DIAGNOSIS — I639 Cerebral infarction, unspecified: Secondary | ICD-10-CM | POA: Diagnosis not present

## 2023-04-21 DIAGNOSIS — R29818 Other symptoms and signs involving the nervous system: Secondary | ICD-10-CM | POA: Diagnosis not present

## 2023-04-21 DIAGNOSIS — I69322 Dysarthria following cerebral infarction: Secondary | ICD-10-CM | POA: Diagnosis not present

## 2023-04-21 LAB — HEMOGLOBIN A1C
Hgb A1c MFr Bld: 6.1 % — ABNORMAL HIGH (ref 4.8–5.6)
Mean Plasma Glucose: 128.37 mg/dL

## 2023-04-21 LAB — COMPREHENSIVE METABOLIC PANEL
ALT: 25 U/L (ref 0–44)
AST: 27 U/L (ref 15–41)
Albumin: 3.8 g/dL (ref 3.5–5.0)
Alkaline Phosphatase: 45 U/L (ref 38–126)
Anion gap: 10 (ref 5–15)
BUN: 13 mg/dL (ref 8–23)
CO2: 25 mmol/L (ref 22–32)
Calcium: 9.5 mg/dL (ref 8.9–10.3)
Chloride: 103 mmol/L (ref 98–111)
Creatinine, Ser: 1.25 mg/dL — ABNORMAL HIGH (ref 0.61–1.24)
GFR, Estimated: >60 mL/min (ref >60)
Glucose, Bld: 119 mg/dL — ABNORMAL HIGH (ref 70–99)
Potassium: 3.3 mmol/L — ABNORMAL LOW (ref 3.5–5.1)
Sodium: 138 mmol/L (ref 135–145)
Total Bilirubin: 0.4 mg/dL (ref 0.3–1.2)
Total Protein: 7.5 g/dL (ref 6.5–8.1)

## 2023-04-21 LAB — I-STAT CHEM 8, ED
BUN: 15 mg/dL (ref 8–23)
Calcium, Ion: 1.19 mmol/L (ref 1.15–1.40)
Chloride: 103 mmol/L (ref 98–111)
Creatinine, Ser: 1.3 mg/dL — ABNORMAL HIGH (ref 0.61–1.24)
Glucose, Bld: 118 mg/dL — ABNORMAL HIGH (ref 70–99)
HCT: 41 % (ref 39.0–52.0)
Hemoglobin: 13.9 g/dL (ref 13.0–17.0)
Potassium: 3.3 mmol/L — ABNORMAL LOW (ref 3.5–5.1)
Sodium: 140 mmol/L (ref 135–145)
TCO2: 29 mmol/L (ref 22–32)

## 2023-04-21 LAB — CBC
HCT: 42.1 % (ref 39.0–52.0)
Hemoglobin: 14.2 g/dL (ref 13.0–17.0)
MCH: 30.5 pg (ref 26.0–34.0)
MCHC: 33.7 g/dL (ref 30.0–36.0)
MCV: 90.5 fL (ref 80.0–100.0)
Platelets: 261 10*3/uL (ref 150–400)
RBC: 4.65 MIL/uL (ref 4.22–5.81)
RDW: 12.5 % (ref 11.5–15.5)
WBC: 9.9 10*3/uL (ref 4.0–10.5)
nRBC: 0 % (ref 0.0–0.2)

## 2023-04-21 LAB — DIFFERENTIAL
Abs Immature Granulocytes: 0.03 10*3/uL (ref 0.00–0.07)
Basophils Absolute: 0 10*3/uL (ref 0.0–0.1)
Basophils Relative: 0 %
Eosinophils Absolute: 0.8 10*3/uL — ABNORMAL HIGH (ref 0.0–0.5)
Eosinophils Relative: 8 %
Immature Granulocytes: 0 %
Lymphocytes Relative: 26 %
Lymphs Abs: 2.5 10*3/uL (ref 0.7–4.0)
Monocytes Absolute: 0.9 10*3/uL (ref 0.1–1.0)
Monocytes Relative: 9 %
Neutro Abs: 5.6 10*3/uL (ref 1.7–7.7)
Neutrophils Relative %: 57 %

## 2023-04-21 LAB — GLUCOSE, CAPILLARY: Glucose-Capillary: 113 mg/dL — ABNORMAL HIGH (ref 70–99)

## 2023-04-21 LAB — PROTIME-INR
INR: 1.1 (ref 0.8–1.2)
Prothrombin Time: 14 seconds (ref 11.4–15.2)

## 2023-04-21 LAB — APTT: aPTT: 27 seconds (ref 24–36)

## 2023-04-21 LAB — ETHANOL: Alcohol, Ethyl (B): <10 mg/dL (ref <10)

## 2023-04-21 LAB — CBG MONITORING, ED: Glucose-Capillary: 131 mg/dL — ABNORMAL HIGH (ref 70–99)

## 2023-04-21 LAB — MRSA NEXT GEN BY PCR, NASAL: MRSA by PCR Next Gen: NOT DETECTED

## 2023-04-21 MED ORDER — POTASSIUM CHLORIDE CRYS ER 20 MEQ PO TBCR
40.0000 meq | EXTENDED_RELEASE_TABLET | Freq: Once | ORAL | Status: AC
  Administered 2023-04-21: 40 meq via ORAL
  Filled 2023-04-21: qty 2

## 2023-04-21 MED ORDER — SODIUM CHLORIDE 0.9 % IV SOLN: INTRAVENOUS | Status: DC

## 2023-04-21 MED ORDER — ACETAMINOPHEN 325 MG PO TABS: 650.0000 mg | ORAL_TABLET | ORAL | Status: DC | PRN

## 2023-04-21 MED ORDER — ACETAMINOPHEN 160 MG/5ML PO SOLN: 650.0000 mg | ORAL | Status: DC | PRN

## 2023-04-21 MED ORDER — CLEVIDIPINE BUTYRATE 0.5 MG/ML IV EMUL: 0.0000 mg/h | INTRAVENOUS | Status: DC

## 2023-04-21 MED ORDER — STROKE: EARLY STAGES OF RECOVERY BOOK
Freq: Once | Status: DC
  Filled 2023-04-21: qty 1

## 2023-04-21 MED ORDER — SODIUM CHLORIDE 0.9% FLUSH
3.0000 mL | Freq: Once | INTRAVENOUS | Status: AC
  Administered 2023-04-21: 3 mL via INTRAVENOUS

## 2023-04-21 MED ORDER — CHLORHEXIDINE GLUCONATE CLOTH 2 % EX PADS
6.0000 | MEDICATED_PAD | Freq: Every day | CUTANEOUS | Status: DC
  Administered 2023-04-21 – 2023-04-22 (×2): 6 via TOPICAL

## 2023-04-21 MED ORDER — PANTOPRAZOLE SODIUM 40 MG IV SOLR
40.0000 mg | Freq: Every day | INTRAVENOUS | Status: DC
  Administered 2023-04-21: 40 mg via INTRAVENOUS
  Filled 2023-04-21: qty 10

## 2023-04-21 MED ORDER — TENECTEPLASE FOR STROKE
0.2500 mg/kg | PACK | Freq: Once | INTRAVENOUS | Status: AC
  Administered 2023-04-21: 20 mg via INTRAVENOUS
  Filled 2023-04-21: qty 10

## 2023-04-21 MED ORDER — SENNOSIDES-DOCUSATE SODIUM 8.6-50 MG PO TABS: 1.0000 | ORAL_TABLET | Freq: Every evening | ORAL | Status: DC | PRN

## 2023-04-21 MED ORDER — ACETAMINOPHEN 650 MG RE SUPP: 650.0000 mg | RECTAL | Status: DC | PRN

## 2023-04-21 MED ORDER — ORAL CARE MOUTH RINSE: 15.0000 mL | OROMUCOSAL | Status: DC | PRN

## 2023-04-21 MED ORDER — IOHEXOL 350 MG/ML SOLN
75.0000 mL | Freq: Once | INTRAVENOUS | Status: AC | PRN
  Administered 2023-04-21: 75 mL via INTRAVENOUS

## 2023-04-21 NOTE — ED Notes (Signed)
CBG 131  

## 2023-04-21 NOTE — Code Documentation (Signed)
Stroke Response Nurse Documentation Code Documentation  STELLAN FERMAN is a 77 y.o. male arriving to [facility]  via [facility] on 5/20 with past medical hx of HLD, HTN, CAD, paroxysmal afib. On No antithrombotic. Code stroke was activated by EMS.   Patient from home where he was LKW at 1645 and now complaining of R sided weakness and ataxia. Patient arrives from home where he has been in his usual state of health all day, allowing him to mow the yard and other activities outside. Patient took a nap from 1400-1600, woke up without deficit, walked out to a hammock in his yard where he sat for a while. At 1645 he noticed weakness in his R hand, at which time he walked inside to his wife, who noticed gait abnormality.    Stroke team at the bedside on patient arrival. Labs drawn and patient cleared for CT by Dr. Adela Lank. Patient to CT with team. NIHSS 4, see documentation for details and code stroke times. Patient with right facial droop, right arm weakness, right leg weakness, and right limb ataxia on exam. The following imaging was completed:  CT Head and CTA. Patient is a candidate for IV Thrombolytic due to fixed neurological defict. Patient is not not a candidate for IR due to no LVO on imaging per MD.   Care Plan: NIHSS and vital signs q15 mins x 2 hours, q30 mins x 6 hours, q 1h x 16 hours, admit to ICU.   Bedside handoff with ED RN Florentina Addison.    Pearlie Oyster  Stroke Response RN

## 2023-04-21 NOTE — Plan of Care (Signed)

## 2023-04-21 NOTE — ED Provider Notes (Signed)
[place] EMERGENCY DEPARTMENT AT [facility] Provider Note   CSN: [id number] Arrival date & time: 04/21/23  1758     History  Chief Complaint  Patient presents with   Code Stroke    Ethan Payne is a 77 y.o. male.  77 yo M with a chief complaints of right-sided weakness.  Started acutely.  Arrived here as a code stroke.  Level 5 caveat acuity of condition.        Home Medications Prior to Admission medications   Medication Sig Start Date End Date Taking? Authorizing Provider  amLODipine (NORVASC) 5 MG tablet Take 1 tablet (5 mg total) by mouth daily. 03/24/23   Shelva Majestic, MD  aspirin EC 81 MG tablet Take 81 mg by mouth daily. Swallow whole.    [provider]  lisinopril-hydrochlorothiazide (ZESTORETIC) 20-12.5 MG tablet Take 1 tablet by mouth daily. 04/07/23   Shelva Majestic, MD  omeprazole (PRILOSEC) 20 MG capsule TAKE 1 CAPSULE BY MOUTH TWICE DAILY BEFORE A MEAL FOR 4 WEEKS, THEN REDUCE TO 1 CAPSULE BY MOUTH DAILY THEREAFTER 03/03/23   Cirigliano, Vito V, DO  rosuvastatin (CRESTOR) 40 MG tablet TAKE 1 TABLET(40 MG) BY MOUTH DAILY 06/06/22   Shelva Majestic, MD  sildenafil (VIAGRA) 100 MG tablet Take 1 tablet (100 mg total) by mouth daily as needed for erectile dysfunction. 04/07/23   Shelva Majestic, MD      Allergies    Hydrocodone and Loratadine    Review of Systems   Review of Systems  Physical Exam Updated Vital Signs BP (!) 157/92   Pulse 96   Temp 98.3 F (36.8 C) (Oral)   Resp 16   Ht 5\' 9"  (1.753 m)   Wt 78.6 kg   SpO2 100%   BMI 25.59 kg/m  Physical Exam Vitals and nursing note reviewed.  Constitutional:      Appearance: He is well-developed.  HENT:     Head: Normocephalic and atraumatic.  Eyes:     Pupils: Pupils are equal, round, and reactive to light.  Neck:     Vascular: No JVD.  Cardiovascular:     Rate and Rhythm: Normal rate and regular rhythm.     Heart sounds: No murmur heard.    No friction rub. No  gallop.  Pulmonary:     Effort: No respiratory distress.     Breath sounds: No wheezing.  Abdominal:     General: There is no distension.     Tenderness: There is no abdominal tenderness. There is no guarding or rebound.  Musculoskeletal:        General: Normal range of motion.     Cervical back: Normal range of motion and neck supple.  Skin:    Coloration: Skin is not pale.     Findings: No rash.  Neurological:     Mental Status: He is alert and oriented to person, place, and time.  Psychiatric:        Behavior: Behavior normal.     ED Results / Procedures / Treatments   Labs (all labs ordered are listed, but only abnormal results are displayed) Labs Reviewed  DIFFERENTIAL - Abnormal; Notable for the following components:      Result Value   Eosinophils Absolute 0.8 (*)    All other components within normal limits  I-STAT CHEM 8, ED - Abnormal; Notable for the following components:   Potassium 3.3 (*)    Creatinine, Ser 1.30 (*)  Glucose, Bld 118 (*)    All other components within normal limits  CBG MONITORING, ED - Abnormal; Notable for the following components:   Glucose-Capillary 131 (*)    All other components within normal limits  PROTIME-INR  APTT  CBC  COMPREHENSIVE METABOLIC PANEL  ETHANOL  LIPID PANEL  HEMOGLOBIN A1C    EKG None  Radiology CT ANGIO HEAD NECK W WO CM (CODE STROKE)  Result Date: 04/21/2023 CLINICAL DATA:  Neuro deficit, acute, stroke suspected. Right-sided weakness. Right-sided ataxia > weakness. EXAM: CT ANGIOGRAPHY HEAD AND NECK WITH AND WITHOUT CONTRAST TECHNIQUE: Multidetector CT imaging of the head and neck was performed using the standard protocol during bolus administration of intravenous contrast. Multiplanar CT image reconstructions and MIPs were obtained to evaluate the vascular anatomy. Carotid stenosis measurements (when applicable) are obtained utilizing NASCET criteria, using the distal internal carotid diameter as the  denominator. RADIATION DOSE REDUCTION: This exam was performed according to the departmental dose-optimization program which includes automated exposure control, adjustment of the mA and/or kV according to patient size and/or use of iterative reconstruction technique. CONTRAST:  75mL OMNIPAQUE IOHEXOL 350 MG/ML SOLN COMPARISON:  Head CT 04/21/2023. FINDINGS: CTA NECK FINDINGS Aortic arch: Four vessel arch configuration with separate origin of the left vertebral artery. Arch vessel origins are patent. Right carotid system: No evidence of dissection, stenosis (50% or greater), or occlusion. Left carotid system: No evidence of dissection, stenosis (50% or greater), or occlusion. Vertebral arteries: Codominant. No evidence of dissection, stenosis (50% or greater), or occlusion. Skeleton: Multilevel cervical spondylosis, worst at C5-6, where there is at least moderate spinal canal stenosis. Other neck: Unremarkable. Upper chest: Unremarkable. Review of the MIP images confirms the above findings CTA HEAD FINDINGS Anterior circulation: Intracranial ICAs are patent without stenosis or aneurysm. The proximal ACAs and MCAs are patent without stenosis or aneurysm. Distal branches are symmetric. Posterior circulation: Normal basilar artery. The SCAs, AICAs and PICAs are patent proximally. The PCAs are patent proximally without stenosis or aneurysm. Distal branches are symmetric. Venous sinuses: Early phase of contrast. Anatomic variants: Persistent fetal origin of the right PCA with hypoplastic right P1 segment. Review of the MIP images confirms the above findings IMPRESSION: 1. No large vessel occlusion, hemodynamically significant stenosis, dissection, or aneurysm in the head or neck vessels. 2. Multilevel cervical spondylosis, worst at C5-6 where there is at least moderate spinal canal stenosis. Electronically Signed   By: Orvan Falconer M.D.   On: 04/21/2023 18:39   CT HEAD CODE STROKE WO CONTRAST  Result Date:  04/21/2023 CLINICAL DATA:  Code stroke. Neuro deficit, acute, stroke suspected. Right-sided weakness and ataxia. EXAM: CT HEAD WITHOUT CONTRAST TECHNIQUE: Contiguous axial images were obtained from the base of the skull through the vertex without intravenous contrast. RADIATION DOSE REDUCTION: This exam was performed according to the departmental dose-optimization program which includes automated exposure control, adjustment of the mA and/or kV according to patient size and/or use of iterative reconstruction technique. COMPARISON:  MRI brain 01/14/2006. FINDINGS: Brain: Within limits of streak artifact in the posterior fossa, no evidence of acute hemorrhage or loss of gray-white differentiation. Mild chronic small-vessel disease. No hydrocephalus or extra-axial collection. No mass effect or midline shift. Vascular: No hyperdense vessel or unexpected calcification. Skull: No calvarial fracture or suspicious bone lesion. Skull base is unremarkable. Sinuses/Orbits: Moderate mucosal disease in the right maxillary sinus. Orbits are unremarkable. Other: None. ASPECTS [facility] Stroke Program Early CT Score) - Ganglionic level infarction (caudate, lentiform nuclei, internal capsule, insula,  M1-M3 cortex): 7 - Supraganglionic infarction (M4-M6 cortex): 3 Total score (0-10 with 10 being normal): 10 IMPRESSION: No acute intracranial hemorrhage or evidence of evolving large vessel territory infarct. ASPECT score is 10. Code stroke imaging results were communicated on 04/21/2023 at 6:15 pm to provider Dr. Selina Cooley via telephone, who verbally acknowledged these results. Electronically Signed   By: Orvan Falconer M.D.   On: 04/21/2023 18:23    Procedures .Critical Care  Performed by: Melene Plan, DO Authorized by: Melene Plan, DO   Critical care provider statement:    Critical care time (minutes):  35   Critical care time was exclusive of:  Separately billable procedures and treating other patients   Critical care was time  spent personally by me on the following activities:  Development of treatment plan with patient or surrogate, discussions with consultants, evaluation of patient's response to treatment, examination of patient, ordering and review of laboratory studies, ordering and review of radiographic studies, ordering and performing treatments and interventions, pulse oximetry, re-evaluation of patient's condition and review of old charts   Care discussed with: admitting provider       Medications Ordered in ED Medications   stroke: early stages of recovery book (has no administration in time range)  0.9 %  sodium chloride infusion (has no administration in time range)  acetaminophen (TYLENOL) tablet 650 mg (has no administration in time range)    Or  acetaminophen (TYLENOL) 160 MG/5ML solution 650 mg (has no administration in time range)    Or  acetaminophen (TYLENOL) suppository 650 mg (has no administration in time range)  senna-docusate (Senokot-S) tablet 1 tablet (has no administration in time range)  pantoprazole (PROTONIX) injection 40 mg (has no administration in time range)  clevidipine (CLEVIPREX) infusion 0.5 mg/mL (has no administration in time range)  sodium chloride flush (NS) 0.9 % injection 3 mL (3 mLs Intravenous Given 04/21/23 1825)  tenecteplase (TNKASE) injection for Stroke 20 mg (20 mg Intravenous Given 04/21/23 1824)  iohexol (OMNIPAQUE) 350 MG/ML injection 75 mL (75 mLs Intravenous Contrast Given 04/21/23 1819)    ED Course/ Medical Decision Making/ A&P                             Medical Decision Making Amount and/or Complexity of Data Reviewed Labs: ordered. Radiology: ordered.   77 yo M with a chief complaint of right-sided weakness.  This started acutely.  The patient arrived as a code stroke.  His airway was clear to the bridge and he was taken urgently back to CT.  Patient without obvious intracranial hemorrhage on my independent interpretation of the CT.  With concern  for acute stroke symptoms patient was given TNK by neurology.  No obvious LVO.  Admit to the ICU.  The patients results and plan were reviewed and discussed.   Any x-rays performed were independently reviewed by myself.   Differential diagnosis were considered with the presenting HPI.  Medications   stroke: early stages of recovery book (has no administration in time range)  0.9 %  sodium chloride infusion (has no administration in time range)  acetaminophen (TYLENOL) tablet 650 mg (has no administration in time range)    Or  acetaminophen (TYLENOL) 160 MG/5ML solution 650 mg (has no administration in time range)    Or  acetaminophen (TYLENOL) suppository 650 mg (has no administration in time range)  senna-docusate (Senokot-S) tablet 1 tablet (has no administration in time range)  pantoprazole (  PROTONIX) injection 40 mg (has no administration in time range)  clevidipine (CLEVIPREX) infusion 0.5 mg/mL (has no administration in time range)  sodium chloride flush (NS) 0.9 % injection 3 mL (3 mLs Intravenous Given 04/21/23 1825)  tenecteplase (TNKASE) injection for Stroke 20 mg (20 mg Intravenous Given 04/21/23 1824)  iohexol (OMNIPAQUE) 350 MG/ML injection 75 mL (75 mLs Intravenous Contrast Given 04/21/23 1819)    Vitals:   04/21/23 1818 04/21/23 1832 04/21/23 1836 04/21/23 1839  BP: (!) 162/83  (!) 157/92 (!) 157/92  Pulse:   97 96  Resp:   12 16  Temp:  98.3 F (36.8 C)    TempSrc:  Oral    SpO2:   100% 100%  Weight:      Height:        Final diagnoses:  Acute ischemic stroke (HCC)    Admission/ observation were discussed with the admitting physician, patient and/or family and they are comfortable with the plan.          Final Clinical Impression(s) / ED Diagnoses Final diagnoses:  Acute ischemic stroke [facility])    Rx / DC Orders ED Discharge Orders     None         Melene Plan, DO 04/21/23 1848

## 2023-04-21 NOTE — Progress Notes (Signed)
PHARMACIST CODE STROKE RESPONSE  Notified to mix TNK at 18:22 by Dr. Selina Cooley TNK preparation completed at 18:23  TNK dose = 20 mg IV over 5 seconds  Issues/delays encountered (if applicable): Patient wanted family to help make decision for TNK.  Ethan Payne 04/21/23 6:13 PM

## 2023-04-21 NOTE — ED Triage Notes (Signed)
Per [facility] pt coming from home as code stroke with LKN 4:45 pm this afternoon. Patient was very active this am and mowed the lawn. Patient went to lay down and when he got up noticed right sided weakness and was having to shuffle with his right foot. Patient presents with facial droop, right arm and leg weakness and ataxia. Patient A&0x4.

## 2023-04-21 NOTE — H&P (Signed)
NEUROLOGY H&P   Date of service: Apr 21, 2023 Patient Name: Ethan Payne MRN:  [id number] DOB:  1946/11/04 Reason for consult: stroke code for R ataxia and weakness Requesting physician: Dr. Melene Plan _ _ _   _ __   _ __ _ _  __ __   _ __   __ _  History of Present Illness   77 yo man with hx HTN and HL presented after acute onset R sided weakness and incoordination. LKW 1645 today when he did some yardwork and went to lay down in hammock. When he tried to get up 20 min later he noticed that his R side was weak and incoordinated (arm and leg). NIHSS = 4. Head CT showed no acute process. Risks, benefits, and alternatives to TNK were discussed with patient who gave informed consent to proceed after further discussion with his adult son. CTA showed no LVO therefore no intervention indicated.  He has remote hx a fib >10 yrs ago which resolved after resection of cardiac myxoma in 2009.   ROS   Per HPI: all other systems reviewed and are negative  Past History   I have reviewed the following:  Past Medical History:  Diagnosis Date   Acute prostatitis 02/04/2008   ATRIAL MYXOMA 02/04/2008   Closed fracture of four ribs 11/01/2009   HYPERLIPIDEMIA 02/04/2008   HYPERTENSION 02/04/2008   Osteoarthritis of right knee 04/18/2010   Past Surgical History:  Procedure Laterality Date   CATARACT EXTRACTION Left 08/03/2019   CORONARY ARTERY BYPASS GRAFT  2007   x2 Saph vein   COX-MAZE MICROWAVE ABLATION  2007   EXCISION OF ATRIAL MYXOMA  2007   Dr C. Cornelius Moras   Family History  Problem Relation Age of Onset   Stroke Mother    Heart disease Father        MI 61   Hypertension Father    Ovarian cancer Sister    Social History   Socioeconomic History   Marital status: Married    Spouse name: Not on file   Number of children: Not on file   Years of education: Not on file   Highest education level: Not on file  Occupational History   Occupation: married  Tobacco Use   Smoking status: Never    Smokeless tobacco: Never  Substance and Sexual Activity   Alcohol use: No   Drug use: No   Sexual activity: Yes  Other Topics Concern   Not on file  Social History Narrative   Married will be 50 years in 2019 (wife seen at another practice). 2 children-daughter unmarried, professional student 10 and son 41 in 2016. 2 grandsons-17 and 105 in 2019.       Still works as Product/process development scientist, does his own carpentry work      Hobbies: fishing   Social Determinants of Corporate investment banker Strain: Low Risk  (03/24/2023)   Overall Financial Resource Strain (CARDIA)    Difficulty of Paying Living Expenses: Not hard at all  Food Insecurity: No Food Insecurity (03/24/2023)   Hunger Vital Sign    Worried About Running Out of Food in the Last Year: Never true    Ran Out of Food in the Last Year: Never true  Transportation Needs: No Transportation Needs (03/24/2023)   PRAPARE - Administrator, Civil Service (Medical): No    Lack of Transportation (Non-Medical): No  Physical Activity: Insufficiently Active (03/24/2023)   Exercise Vital Sign  Days of Exercise per Week: 3 days    Minutes of Exercise per Session: 30 min  Stress: No Stress Concern Present (03/24/2023)   [employer] of Occupational Health - Occupational Stress Questionnaire    Feeling of Stress : Not at all  Social Connections: Moderately Isolated (03/24/2023)   Social Connection and Isolation Panel [NHANES]    Frequency of Communication with Friends and Family: More than three times a week    Frequency of Social Gatherings with Friends and Family: More than three times a week    Attends Religious Services: Never    Database administrator or Organizations: No    Attends Banker Meetings: Never    Marital Status: Married   Allergies  Allergen Reactions   Hydrocodone Palpitations    After rib fractures. Hydrocodone-States had shortness of breath, 911 was called After rib fractures.  Hydrocodone-States had shortness of breath, 911 was called   Loratadine Other (See Comments) and Anxiety    Heart speeds up Other reaction(s): Other (See Comments) Heart speeds up    Medications   (Not in a hospital admission)     Current Facility-Administered Medications:    sodium chloride flush (NS) 0.9 % injection 3 mL, 3 mL, Intravenous, Once, Melene Plan, DO  Current Outpatient Medications:    amLODipine (NORVASC) 5 MG tablet, Take 1 tablet (5 mg total) by mouth daily., Disp: 90 tablet, Rfl: 3   aspirin EC 81 MG tablet, Take 81 mg by mouth daily. Swallow whole., Disp: , Rfl:    lisinopril-hydrochlorothiazide (ZESTORETIC) 20-12.5 MG tablet, Take 1 tablet by mouth daily., Disp: 30 tablet, Rfl: 5   omeprazole (PRILOSEC) 20 MG capsule, TAKE 1 CAPSULE BY MOUTH TWICE DAILY BEFORE A MEAL FOR 4 WEEKS, THEN REDUCE TO 1 CAPSULE BY MOUTH DAILY THEREAFTER, Disp: 90 capsule, Rfl: 1   rosuvastatin (CRESTOR) 40 MG tablet, TAKE 1 TABLET(40 MG) BY MOUTH DAILY, Disp: 90 tablet, Rfl: 3   sildenafil (VIAGRA) 100 MG tablet, Take 1 tablet (100 mg total) by mouth daily as needed for erectile dysfunction., Disp: 10 tablet, Rfl: 11  Vitals   There were no vitals filed for this visit.   There is no height or weight on file to calculate BMI.  Physical Exam   Physical Exam Gen: A&O x4, NAD HEENT: Atraumatic, normocephalic;mucous membranes moist; oropharynx clear, tongue without atrophy or fasciculations. Neck: Supple, trachea midline. Resp: CTAB, no w/r/r CV: RRR, no m/g/r; nml S1 and S2. 2+ symmetric peripheral pulses. Abd: soft/NT/ND; nabs x 4 quad Extrem: Nml bulk; no cyanosis, clubbing, or edema.  Neuro: *MS: A&O x4. Follows multi-step commands.  *Speech: fluid, nondysarthric, able to name and repeat *CN:    I: Deferred   II,III: PERRLA, VFF by confrontation, optic discs unable to be visualized 2/2 pupillary constriction   III,IV,VI: EOMI w/o nystagmus, no ptosis   V: Sensation intact  from V1 to V3 to LT   VII: Eyelid closure was full.  R UMN facial droop   VIII: Hearing intact to voice   IX,X: Voice normal, palate elevates symmetrically    XI: SCM/trap 5/5 bilat   XII: Tongue protrudes midline, no atrophy or fasciculations. *Motor:   Normal bulk.  No tremor, rigidity or bradykinesia. Mild drift but not to bed RUE and RLE. No drift LUE and LLE. Equal grip strength bilat. *Sensory: Intact to light touch, pinprick, temperature vibration throughout. Symmetric. Propioception intact bilat.  No double-simultaneous extinction.  *Coordination:  Finger-to-nose, heel-to-shin, rapid alternating motions  were intact. *Reflexes:  2+ and symmetric throughout without clonus; toes down-going bilat *Gait: normal base, normal stride, normal turn. Negative Romberg.  NIHSS  1a Level of Conscious.: 0 1b LOC Questions: 0 1c LOC Commands: 0 2 Best Gaze: 0 3 Visual: 0 4 Facial Palsy: 1 5a Motor Arm - left: 0 5b Motor Arm - Right: 1 6a Motor Leg - Left: 0 6b Motor Leg - Right: 1 7 Limb Ataxia: 1 8 Sensory: 0 9 Best Language: 0 10 Dysarthria: 0 11 Extinct. and Inatten.: 0  TOTAL: 4   Premorbid mRS = 0   Labs   CBC: No results for input(s): "WBC", "NEUTROABS", "HGB", "HCT", "MCV", "PLT" in the last 168 hours.  Basic Metabolic Panel:  Lab Results  Component Value Date   NA 137 11/12/2022   K 4.0 11/12/2022   CO2 29 11/12/2022   GLUCOSE 102 (H) 11/12/2022   BUN 10 11/12/2022   CREATININE 1.03 11/12/2022   CALCIUM 9.9 11/12/2022   GFRNONAA 80.69 01/05/2010   GFRAA  10/19/2009    >60        The eGFR has been calculated using the MDRD equation. This calculation has not been validated in all clinical situations. eGFR's persistently <60 mL/min signify possible Chronic Kidney Disease.   Lipid Panel:  Lab Results  Component Value Date   LDLCALC 151 (H) 11/12/2022   HgbA1c:  Lab Results  Component Value Date   HGBA1C  10/20/2009    5.7 (NOTE) The ADA recommends  the following therapeutic goal for glycemic control related to Hgb A1c measurement: Goal of therapy: <6.5 Hgb A1c  Reference: American Diabetes Association: Clinical Practice Recommendations 2010, Diabetes Care, 2010, 33: (Suppl  1).   Urine Drug Screen: No results found for: "LABOPIA", "COCAINSCRNUR", "LABBENZ", "AMPHETMU", "THCU", "LABBARB"  Alcohol Level No results found for: "ETH"  CT Head without contrast: Artifact in posterior fossa 2/2 gold teeth, no hemorrhage or acute process  CTA H&N No LVO  Imaging personally reviewed  Assessment   77 yo man with hx HTN and HL presented after acute onset R sided weakness and incoordination c/f acute ischemic stroke, now s/p TNK. No LVO on CTA.  Plan   - Admit to ICU under Dr. Selina Cooley - Neurochecks and NIHSS documentation per post-TNK protocol - STAT head CT for any change in neurologic exam - Non-con head CT 24 hrs post-TNK r/o hemorrhagic conversion - MRI brain wo contrast - TTE - no aspirin for 24 hours post TNK and until ICH ruled out by repeat head CT - keep SBP less than 180/105 for the 1st 24 hours post TNK to reduce the risk of hemorrhagic transformation - SCDs for DVT prophylaxis; can start SQ Heparin if head CT 24 hours post TNK is negative for ICH - NPO; swallow study - PT/OT and speech therapy - stroke education - outpatient f/u with neurology after discharge  This patient is critically ill and at significant risk of neurological worsening, death and care requires constant monitoring of vital signs, hemodynamics,respiratory and cardiac monitoring, neurological assessment, discussion with family, other specialists and medical decision making of high complexity. I spent 90 minutes of neurocritical care time  in the care of  this patient. This was time spent independent of any time provided by nurse practitioner or PA.  Bing Neighbors, MD [facility] [phone]  If 7pm- 7am, please page neurology on call as listed  in AMION.

## 2023-04-22 ENCOUNTER — Inpatient Hospital Stay (HOSPITAL_COMMUNITY): Payer: Medicare Other

## 2023-04-22 DIAGNOSIS — I6389 Other cerebral infarction: Secondary | ICD-10-CM | POA: Diagnosis not present

## 2023-04-22 LAB — CBC WITH DIFFERENTIAL/PLATELET
Abs Immature Granulocytes: 0.02 10*3/uL (ref 0.00–0.07)
Basophils Absolute: 0.1 10*3/uL (ref 0.0–0.1)
Basophils Relative: 1 %
Eosinophils Absolute: 0.5 10*3/uL (ref 0.0–0.5)
Eosinophils Relative: 5 %
HCT: 41.1 % (ref 39.0–52.0)
Hemoglobin: 14.2 g/dL (ref 13.0–17.0)
Immature Granulocytes: 0 %
Lymphocytes Relative: 23 %
Lymphs Abs: 2.4 10*3/uL (ref 0.7–4.0)
MCH: 30.6 pg (ref 26.0–34.0)
MCHC: 34.5 g/dL (ref 30.0–36.0)
MCV: 88.6 fL (ref 80.0–100.0)
Monocytes Absolute: 0.9 10*3/uL (ref 0.1–1.0)
Monocytes Relative: 8 %
Neutro Abs: 6.7 10*3/uL (ref 1.7–7.7)
Neutrophils Relative %: 63 %
Platelets: 256 10*3/uL (ref 150–400)
RBC: 4.64 MIL/uL (ref 4.22–5.81)
RDW: 12.7 % (ref 11.5–15.5)
WBC: 10.5 10*3/uL (ref 4.0–10.5)
nRBC: 0 % (ref 0.0–0.2)

## 2023-04-22 LAB — LIPID PANEL
Cholesterol: 289 mg/dL — ABNORMAL HIGH (ref 0–200)
HDL: 50 mg/dL (ref >40)
LDL Cholesterol: 219 mg/dL — ABNORMAL HIGH (ref 0–99)
Total CHOL/HDL Ratio: 5.8 RATIO
Triglycerides: 100 mg/dL (ref <150)
VLDL: 20 mg/dL (ref 0–40)

## 2023-04-22 LAB — ECHOCARDIOGRAM COMPLETE
Area-P 1/2: 3.06 cm2
Height: 69 in
S' Lateral: 2.9 cm
Weight: 2772.5 oz

## 2023-04-22 LAB — COMPREHENSIVE METABOLIC PANEL
ALT: 24 U/L (ref 0–44)
AST: 23 U/L (ref 15–41)
Albumin: 3.9 g/dL (ref 3.5–5.0)
Alkaline Phosphatase: 45 U/L (ref 38–126)
Anion gap: 8 (ref 5–15)
BUN: 11 mg/dL (ref 8–23)
CO2: 27 mmol/L (ref 22–32)
Calcium: 9.2 mg/dL (ref 8.9–10.3)
Chloride: 102 mmol/L (ref 98–111)
Creatinine, Ser: 1.14 mg/dL (ref 0.61–1.24)
GFR, Estimated: >60 mL/min (ref >60)
Glucose, Bld: 121 mg/dL — ABNORMAL HIGH (ref 70–99)
Potassium: 4.2 mmol/L (ref 3.5–5.1)
Sodium: 137 mmol/L (ref 135–145)
Total Bilirubin: 1.2 mg/dL (ref 0.3–1.2)
Total Protein: 7.3 g/dL (ref 6.5–8.1)

## 2023-04-22 LAB — GLUCOSE, CAPILLARY: Glucose-Capillary: 123 mg/dL — ABNORMAL HIGH (ref 70–99)

## 2023-04-22 LAB — MAGNESIUM: Magnesium: 2 mg/dL (ref 1.7–2.4)

## 2023-04-22 MED ORDER — ROSUVASTATIN CALCIUM 20 MG PO TABS
20.0000 mg | ORAL_TABLET | Freq: Every day | ORAL | Status: DC
  Administered 2023-04-22: 20 mg via ORAL

## 2023-04-22 MED ORDER — PANTOPRAZOLE SODIUM 40 MG PO TBEC
40.0000 mg | DELAYED_RELEASE_TABLET | Freq: Every day | ORAL | Status: DC
  Administered 2023-04-22 – 2023-04-23 (×2): 40 mg via ORAL
  Filled 2023-04-22 (×2): qty 1

## 2023-04-22 NOTE — TOC Initial Note (Signed)
Transition of Care [facility]) - Initial/Assessment Note    Patient Details  Name: Ethan Payne MRN: [id number] Date of Birth: 09-Mar-1946  Transition of Care [facility] [place] [facility]) CM/SW Contact:    Durenda Guthrie, RN Phone Number: 04/22/2023, 9:45 AM  Clinical Narrative:                  Transition of Care [facility]) Department has reviewed patient and no TOC needs have been identified at this time. We will continue to monitor patient advancement through Interdisciplinary progressions and if new patient needs arise, please place a consult.   Barriers to Discharge: Continued Medical Work up   Patient Goals and CMS Choice            Expected Discharge Plan and Services                                              Prior Living Arrangements/Services                       Activities of Daily Living      Permission Sought/Granted                  Emotional Assessment              Admission diagnosis:  Acute ischemic stroke [facility]) [I63.9] Patient Active Problem List   Diagnosis Date Noted   Acute ischemic stroke (HCC) 04/21/2023   Paroxysmal atrial fibrillation (HCC) 12/21/2021   Leg length difference, acquired 01/07/2018   Rotator cuff syndrome of right shoulder 12/08/2017   BPH associated with nocturia 04/10/2015   Health care maintenance 02/18/2015   Erectile dysfunction 02/18/2015   CAD (coronary artery disease) 02/17/2015   Heel pain 03/22/2011   GERD (gastroesophageal reflux disease) 03/22/2011   Osteoarthritis of right knee 04/18/2010   s/p excision of Atrial myxoma 02/04/2008   Hyperlipidemia 02/04/2008   Essential hypertension 02/04/2008   PCP:  Shelva Majestic, MD Pharmacy:   [facility] [phone] [address], [place] - [address] AT [facility] [address] [place] [address] Phone: [phone] Fax: [phone]     Social Determinants of Health (SDOH) Social History: SDOH Screenings   Food Insecurity: No Food  Insecurity (03/24/2023)  Housing: Low Risk  (03/24/2023)  Transportation Needs: No Transportation Needs (03/24/2023)  Utilities: Not At Risk (03/24/2023)  Depression (PHQ2-9): Low Risk  (03/24/2023)  Financial Resource Strain: Low Risk  (03/24/2023)  Physical Activity: Insufficiently Active (03/24/2023)  Social Connections: Moderately Isolated (03/24/2023)  Stress: No Stress Concern Present (03/24/2023)  Tobacco Use: Low Risk  (04/21/2023)   SDOH Interventions:     Readmission Risk Interventions     No data to display

## 2023-04-22 NOTE — Progress Notes (Addendum)
STROKE TEAM PROGRESS NOTE   INTERVAL HISTORY His wife is at the bedside.   Patient presented as code stroke 04/21/23 due to acute onset of right-sided weakness.  Patient was doing yard work then right hand became weak patient laid down on his hammock for around 15 minutes and when he went to get up he could not hold his cell phone and fell.  Denied speech or vision issues.  CT showed no acute process.  Patient received TNK in the ED at 1823.  No intervention due to no LVO on CTA. Pending 24-hour MRI scan On exam today patient states right weakness has improved, right facial droop present, some ataxia continues in right arm, weaker grip right hand on right hand bilateral sensation intact. Blood pressure adequately controlled. Vitals:   04/22/23 1100 04/22/23 1111 04/22/23 1124 04/22/23 1130  BP:    127/77  Pulse: 86     Resp: (!) 24     Temp:  98.2 F (36.8 C)    TempSrc:  Oral    SpO2: 96%  96%   Weight:      Height:       CBC:  Recent Labs  Lab 04/21/23 1800 04/21/23 1805 04/22/23 0204  WBC 9.9  --  10.5  NEUTROABS 5.6  --  6.7  HGB 14.2 13.9 14.2  HCT 42.1 41.0 41.1  MCV 90.5  --  88.6  PLT 261  --  256   Basic Metabolic Panel:  Recent Labs  Lab 04/21/23 1800 04/21/23 1805 04/22/23 0204  NA 138 140 137  K 3.3* 3.3* 4.2  CL 103 103 102  CO2 25  --  27  GLUCOSE 119* 118* 121*  BUN 13 15 11   CREATININE 1.25* 1.30* 1.14  CALCIUM 9.5  --  9.2  MG  --   --  2.0   Lipid Panel:  Recent Labs  Lab 04/22/23 0204  CHOL 289*  TRIG 100  HDL 50  CHOLHDL 5.8  VLDL 20  LDLCALC 161*   HgbA1c:  Recent Labs  Lab 04/21/23 1800  HGBA1C 6.1*   Urine Drug Screen: No results for input(s): "LABOPIA", "COCAINSCRNUR", "LABBENZ", "AMPHETMU", "THCU", "LABBARB" in the last 168 hours.  Alcohol Level  Recent Labs  Lab 04/21/23 1800  ETH <10    IMAGING past 24 hours ECHOCARDIOGRAM COMPLETE  Result Date: 04/22/2023    ECHOCARDIOGRAM REPORT   Patient Name:   Ethan Payne Date of Exam: 04/22/2023 Medical Rec #:  [id number]       Height:       69.0 in Accession #:    [id number]      Weight:       173.3 lb Date of Birth:  1946/07/25      BSA:          1.944 m Patient Age:    76 years        BP:           156/75 mmHg Patient Gender: M               HR:           85 bpm. Exam Location:  Inpatient Procedure: 2D Echo, Color Doppler and Cardiac Doppler Indications:    Stroke i63.9  History:        Patient has no prior history of Echocardiogram examinations.                 CAD, Arrythmias:Atrial Fibrillation; Risk Factors:Hypertension  and Dyslipidemia. History of cardiac myxoma removed in 2009.  Sonographer:    Irving Burton Senior RDCS Referring Phys: Malachi Carl STACK IMPRESSIONS  1. Left ventricular ejection fraction, by estimation, is 55 to 60%. The left ventricle has normal function. The left ventricle has no regional wall motion abnormalities. There is mild concentric left ventricular hypertrophy of the septal segment. Left ventricular diastolic parameters were normal.  2. Right ventricular systolic function is normal. The right ventricular size is normal. Tricuspid regurgitation signal is inadequate for assessing PA pressure.  3. The mitral valve is grossly normal. Trivial mitral valve regurgitation. No evidence of mitral stenosis.  4. The aortic valve is tricuspid. Aortic valve regurgitation is trivial. Aortic valve sclerosis is present, with no evidence of aortic valve stenosis.  5. The inferior vena cava is normal in size with greater than 50% respiratory variability, suggesting right atrial pressure of 3 mmHg. Comparison(s): No evidence of left atrial mass on transthoracic echo. In view of patient's history of myxoma resection, consider TEE if a cardioembolic cause of stroke is suspected. FINDINGS  Left Ventricle: Left ventricular ejection fraction, by estimation, is 55 to 60%. The left ventricle has normal function. The left ventricle has no regional wall motion  abnormalities. The left ventricular internal cavity size was normal in size. There is  mild concentric left ventricular hypertrophy of the septal segment. Left ventricular diastolic parameters were normal. Right Ventricle: The right ventricular size is normal. No increase in right ventricular wall thickness. Right ventricular systolic function is normal. Tricuspid regurgitation signal is inadequate for assessing PA pressure. Left Atrium: Left atrial size was normal in size. Right Atrium: Right atrial size was normal in size. Pericardium: Trivial pericardial effusion is present. Presence of epicardial fat layer. Mitral Valve: The mitral valve is grossly normal. Trivial mitral valve regurgitation. No evidence of mitral valve stenosis. Tricuspid Valve: The tricuspid valve is normal in structure. Tricuspid valve regurgitation is trivial. No evidence of tricuspid stenosis. Aortic Valve: The aortic valve is tricuspid. Aortic valve regurgitation is trivial. Aortic valve sclerosis is present, with no evidence of aortic valve stenosis. Pulmonic Valve: The pulmonic valve was normal in structure. Pulmonic valve regurgitation is trivial. No evidence of pulmonic stenosis. Aorta: The aortic root is normal in size and structure. Venous: The inferior vena cava is normal in size with greater than 50% respiratory variability, suggesting right atrial pressure of 3 mmHg. IAS/Shunts: No atrial level shunt detected by color flow Doppler.  LEFT VENTRICLE PLAX 2D LVIDd:         4.10 cm   Diastology LVIDs:         2.90 cm   LV e' medial:    10.00 cm/s LV PW:         1.00 cm   LV E/e' medial:  9.2 LV IVS:        1.20 cm   LV e' lateral:   13.40 cm/s LVOT diam:     2.00 cm   LV E/e' lateral: 6.9 LV SV:         52 LV SV Index:   27 LVOT Area:     3.14 cm  RIGHT VENTRICLE RV S prime:     13.70 cm/s TAPSE (M-mode): 1.9 cm LEFT ATRIUM             Index        RIGHT ATRIUM           Index LA diam:        3.10  cm 1.59 cm/m   RA Area:     19.10  cm LA Vol (A2C):   45.8 ml 23.56 ml/m  RA Volume:   55.50 ml  28.55 ml/m LA Vol (A4C):   38.3 ml 19.70 ml/m LA Biplane Vol: 42.0 ml 21.61 ml/m  AORTIC VALVE LVOT Vmax:   79.00 cm/s LVOT Vmean:  59.500 cm/s LVOT VTI:    0.167 m  AORTA Ao Root diam: 3.60 cm Ao Asc diam:  3.70 cm MITRAL VALVE MV Area (PHT): 3.06 cm    SHUNTS MV Decel Time: 248 msec    Systemic VTI:  0.17 m MV E velocity: 91.90 cm/s  Systemic Diam: 2.00 cm MV A velocity: 52.60 cm/s MV E/A ratio:  1.75 Mihai Croitoru MD Electronically signed by Thurmon Fair MD Signature Date/Time: 04/22/2023/1:29:17 PM    Final    CT ANGIO HEAD NECK W WO CM (CODE STROKE)  Result Date: 04/21/2023 CLINICAL DATA:  Neuro deficit, acute, stroke suspected. Right-sided weakness. Right-sided ataxia > weakness. EXAM: CT ANGIOGRAPHY HEAD AND NECK WITH AND WITHOUT CONTRAST TECHNIQUE: Multidetector CT imaging of the head and neck was performed using the standard protocol during bolus administration of intravenous contrast. Multiplanar CT image reconstructions and MIPs were obtained to evaluate the vascular anatomy. Carotid stenosis measurements (when applicable) are obtained utilizing NASCET criteria, using the distal internal carotid diameter as the denominator. RADIATION DOSE REDUCTION: This exam was performed according to the departmental dose-optimization program which includes automated exposure control, adjustment of the mA and/or kV according to patient size and/or use of iterative reconstruction technique. CONTRAST:  75mL OMNIPAQUE IOHEXOL 350 MG/ML SOLN COMPARISON:  Head CT 04/21/2023. FINDINGS: CTA NECK FINDINGS Aortic arch: Four vessel arch configuration with separate origin of the left vertebral artery. Arch vessel origins are patent. Right carotid system: No evidence of dissection, stenosis (50% or greater), or occlusion. Left carotid system: No evidence of dissection, stenosis (50% or greater), or occlusion. Vertebral arteries: Codominant. No evidence of  dissection, stenosis (50% or greater), or occlusion. Skeleton: Multilevel cervical spondylosis, worst at C5-6, where there is at least moderate spinal canal stenosis. Other neck: Unremarkable. Upper chest: Unremarkable. Review of the MIP images confirms the above findings CTA HEAD FINDINGS Anterior circulation: Intracranial ICAs are patent without stenosis or aneurysm. The proximal ACAs and MCAs are patent without stenosis or aneurysm. Distal branches are symmetric. Posterior circulation: Normal basilar artery. The SCAs, AICAs and PICAs are patent proximally. The PCAs are patent proximally without stenosis or aneurysm. Distal branches are symmetric. Venous sinuses: Early phase of contrast. Anatomic variants: Persistent fetal origin of the right PCA with hypoplastic right P1 segment. Review of the MIP images confirms the above findings IMPRESSION: 1. No large vessel occlusion, hemodynamically significant stenosis, dissection, or aneurysm in the head or neck vessels. 2. Multilevel cervical spondylosis, worst at C5-6 where there is at least moderate spinal canal stenosis. Electronically Signed   By: Orvan Falconer M.D.   On: 04/21/2023 18:39   CT HEAD CODE STROKE WO CONTRAST  Result Date: 04/21/2023 CLINICAL DATA:  Code stroke. Neuro deficit, acute, stroke suspected. Right-sided weakness and ataxia. EXAM: CT HEAD WITHOUT CONTRAST TECHNIQUE: Contiguous axial images were obtained from the base of the skull through the vertex without intravenous contrast. RADIATION DOSE REDUCTION: This exam was performed according to the departmental dose-optimization program which includes automated exposure control, adjustment of the mA and/or kV according to patient size and/or use of iterative reconstruction technique. COMPARISON:  MRI brain 01/14/2006. FINDINGS: Brain: Within  limits of streak artifact in the posterior fossa, no evidence of acute hemorrhage or loss of gray-white differentiation. Mild chronic small-vessel disease.  No hydrocephalus or extra-axial collection. No mass effect or midline shift. Vascular: No hyperdense vessel or unexpected calcification. Skull: No calvarial fracture or suspicious bone lesion. Skull base is unremarkable. Sinuses/Orbits: Moderate mucosal disease in the right maxillary sinus. Orbits are unremarkable. Other: None. ASPECTS (Alberta Stroke Program Early CT Score) - Ganglionic level infarction (caudate, lentiform nuclei, internal capsule, insula, M1-M3 cortex): 7 - Supraganglionic infarction (M4-M6 cortex): 3 Total score (0-10 with 10 being normal): 10 IMPRESSION: No acute intracranial hemorrhage or evidence of evolving large vessel territory infarct. ASPECT score is 10. Code stroke imaging results were communicated on 04/21/2023 at 6:15 pm to provider Dr. Selina Cooley via telephone, who verbally acknowledged these results. Electronically Signed   By: Orvan Falconer M.D.   On: 04/21/2023 18:23    PHYSICAL EXAM General: Sitting up in bed, no acute distress HEENT: Atraumatic, normocephalic Respiratory: Unlabored breathing, room air CV: RRR, symmetric peripheral pulses Abdomen: Soft nontender  Neuro: MS: AOx4, follows commands. Speech: Fluid, no dysarthria or neglect present naming and repetition intact. CN: Visual fields full, EOMI without nystagmus, facial sensation intact, right-sided facial droop, hard of hearing at baseline, symmetrical head turn and shoulder shrug, tongue with midline protrusion Motor: RUE/RLE 4 out of 5, 4 out of 5 grip.   Sensation: Light touch intact. Coordination: Ataxia to RUE with finger-to-nose.  Rapid alternating movements intact heel-to-shin intact bilaterally Gait: Deferred  NIHSS:  1a Level of Conscious.: 0 1b LOC Questions: 0 1c LOC Commands: 0 2 Best Gaze: 0 3 Visual: 0 4 Facial Palsy: 1 5a Motor Arm - left: 0 5b Motor Arm - Right:1  6a Motor Leg - Left: 0 6b Motor Leg - Right: 0 7 Limb Ataxia: 1 8 Sensory: 0 9 Best Language: 0 10 Dysarthria: 0 11  Extinct. and Inatten.: 0 TOTAL: 3   ASSESSMENT/PLAN Mr. Ethan Payne is a 77 y.o. male with history of hypertension, hyperlipidemia Patient presented as code stroke 5/20 due to acute onset of right-sided weakness. Denied speech or vision issues.  CT showed no acute process.  Patient received TNK in the ED at 1823.  No intervention due to no LVO on CTA. Pending 24-hour MRI scan.   Left hemispheric TIA / small stroke s/p  TNK Etiology:  Ischemia due to dehydration? Pending continued work-up Secondary risk factors include: Hypertension, hyperlipidemia, prediabetes Code Stroke CT head: No acute abnormality.  ASPECTS 10.    CTA head & neck: No LVO  MRI: pending  2D Echo: EF 55 to 60%, mild concentric LVH, trivial MVR, trivial AVR, no evidence of left atrial mass, no shunt  LDL 219 HgbA1c 6.1 VTE prophylaxis - SCDs    Diet   Diet regular Room service appropriate? Yes; Fluid consistency: Thin   No antithrombotic prior to admission, now on No antithrombotic pending 24-hour post TNK stable imaging. Therapy recommendations:  pending Disposition:  pending  Hypertension Home meds: Norvasc 5 mg, lisinopril 20 mg Stable Permissive hypertension (OK if < 220/120) but gradually normalize in 2-3 days Long-term BP goal normotensive  Hyperlipidemia Home meds: None LDL 219, goal < 70 Add Crestor 20 mg Continue statin at discharge  Diabetes type II, Pre-Diabetic Home meds:  none HgbA1c 6.1, goal < 7.0 CBGs Recent Labs    04/21/23 1800 04/21/23 2020 04/22/23 0317  GLUCAP 131* 113* 123*    SSI Recommend close PCP follow-up  as A1c is within prediabetic range  Other Stroke Risk Factors Advanced Age >/= 21  Hx stroke/TIA Family hx stroke (stroke)   Other Active Problems   Hospital day # 1   Pt seen by Neuro NP/APP and later by MD. Note/plan to be edited by MD as needed.    Lynnae January, DNP, AGACNP-BC [facility] Please use AMION for contact information &  EPIC for messaging.  STROKE MD NOTE :  I have personally obtained history,examined this patient, reviewed notes, independently viewed imaging studies, participated in medical decision making and plan of care.ROS completed by me personally and pertinent positives fully documented  I have made any additions or clarifications directly to the above note. Agree with note above.  Patient presented with an onset of right-sided weakness and received thrombolysis with IV TNK with near complete recovery.  Continue close neurological follow-up and strict blood pressure control as per post TNK protocol.  Mobilize out of bed.  Therapy consults.  Check MRI scan of the brain later today.  Aggressive risk factor modification.  No family available at the bedside for discussion.  Long discussion with patient and answered questions.This patient is critically ill and at significant risk of neurological worsening, death and care requires constant monitoring of vital signs, hemodynamics,respiratory and cardiac monitoring, extensive review of multiple databases, frequent neurological assessment, discussion with family, other specialists and medical decision making of high complexity.I have made any additions or clarifications directly to the above note.This critical care time does not reflect procedure time, or teaching time or supervisory time of PA/NP/Med Resident etc but could involve care discussion time.  I spent 30 minutes of neurocritical care time  in the care of  this patient.      Delia Heady, MD Medical Director [facility] Pager: [phone] 04/22/2023 3:54 PM  To contact Stroke Continuity provider, please refer to [email or web address]. After hours, contact General Neurology

## 2023-04-22 NOTE — Evaluation (Signed)
Speech Language Pathology Evaluation Patient Details Name: Ethan Payne MRN: [id number] DOB: Sep 04, 1946 Today's Date: 04/22/2023 Time: 4782-9562 SLP Time Calculation (min) (ACUTE ONLY): 15 min  Problem List:  Patient Active Problem List   Diagnosis Date Noted   Acute ischemic stroke (HCC) 04/21/2023   Paroxysmal atrial fibrillation (HCC) 12/21/2021   Leg length difference, acquired 01/07/2018   Rotator cuff syndrome of right shoulder 12/08/2017   BPH associated with nocturia 04/10/2015   Health care maintenance 02/18/2015   Erectile dysfunction 02/18/2015   CAD (coronary artery disease) 02/17/2015   Heel pain 03/22/2011   GERD (gastroesophageal reflux disease) 03/22/2011   Osteoarthritis of right knee 04/18/2010   s/p excision of Atrial myxoma 02/04/2008   Hyperlipidemia 02/04/2008   Essential hypertension 02/04/2008   Past Medical History:  Past Medical History:  Diagnosis Date   Acute prostatitis 02/04/2008   ATRIAL MYXOMA 02/04/2008   Closed fracture of four ribs 11/01/2009   HYPERLIPIDEMIA 02/04/2008   HYPERTENSION 02/04/2008   Osteoarthritis of right knee 04/18/2010   Past Surgical History:  Past Surgical History:  Procedure Laterality Date   CATARACT EXTRACTION Left 08/03/2019   CORONARY ARTERY BYPASS GRAFT  2007   x2 Saph vein   COX-MAZE MICROWAVE ABLATION  2007   EXCISION OF ATRIAL MYXOMA  2007   Dr C. Cornelius Moras   HPI:  Ethan Payne is a 77 y.o. male who presented to ED with acute onset right sided weakness and ataxia. S/P TNK 5/20. PMHx HTN, HL, wears hearing aids.   Assessment / Plan / Recommendation Clinical Impression  Pt participated in speech/language evaluation with wife at bedside. He endorsed hearing impairment, which did impact performance and required full visualization of face/mouth so that he could read lips. His wife stated she will bring in HA next date.  He demonstrated good comprehension with normal discrimination, following commands, and normal yes/no  reliability. Speech was clear, without dysarthria, and fluent. Repetition intact. He expressed ideas with no deficits in semantics/syntax.  He was able to write name and read orally. No SLP needs identified. Our service will sign off.    SLP Assessment  SLP Recommendation/Assessment: Patient does not need any further Speech Lanaguage Pathology Services SLP Visit Diagnosis: Cognitive communication deficit (R41.841)    Recommendations for follow up therapy are one component of a multi-disciplinary discharge planning process, led by the attending physician.  Recommendations may be updated based on patient status, additional functional criteria and insurance authorization.    Follow Up Recommendations  No SLP follow up                       SLP Evaluation Cognition  Overall Cognitive Status: Within Functional Limits for tasks assessed Orientation Level: Oriented X4       Comprehension  Auditory Comprehension Overall Auditory Comprehension: Appears within functional limits for tasks assessed Yes/No Questions: Within Functional Limits Commands: Within Functional Limits Conversation: Simple Visual Recognition/Discrimination Discrimination: Within Function Limits Reading Comprehension Reading Status: Within funtional limits    Expression Expression Primary Mode of Expression: Verbal Verbal Expression Overall Verbal Expression: Appears within functional limits for tasks assessed Initiation: No impairment Automatic Speech: Name;Social Response;Month of year Level of Generative/Spontaneous Verbalization: Conversation Repetition: No impairment Naming: No impairment Pragmatics: No impairment Written Expression Dominant Hand: Right Written Expression: Within Functional Limits   Oral / Motor  Oral Motor/Sensory Function Overall Oral Motor/Sensory Function: Within functional limits Motor Speech Overall Motor Speech: Appears within functional limits for tasks assessed  Blenda Mounts Laurice 04/22/2023, 1:26 PM Jill Side. Samson Frederic, MA CCC/SLP Clinical Specialist - Acute Care SLP Acute Rehabilitation Services Office number [phone]

## 2023-04-22 NOTE — Progress Notes (Signed)
OT Cancellation Note  Patient Details Name: Ethan Payne MRN: [id number] DOB: 1946-03-22   Cancelled Treatment:    Reason Eval/Treat Not Completed: Active bedrest order (24 hr bedrest post TNK starting 5/20 at 1824. OT evaluation to f/u once activity orders progress)  Donia Pounds 04/22/2023, 7:46 AM

## 2023-04-22 NOTE — Progress Notes (Signed)
PT Cancellation Note  Patient Details Name: KEMET BIDLACK MRN: [id number] DOB: 05/15/1946   Cancelled Treatment:    Reason Eval/Treat Not Completed: Active bedrest order - s/p TNK 5/20 at 1823  Marye Round, PT DPT Acute Rehabilitation Services Secure Chat Preferred  Office [phone]    Pami Wool E Christain [place] 04/22/2023, 11:00 AM

## 2023-04-22 NOTE — Progress Notes (Signed)
Echocardiogram 2D Echocardiogram has been performed.  Warren Lacy Saivon Prowse RDCS 04/22/2023, 11:09 AM

## 2023-04-23 ENCOUNTER — Telehealth (HOSPITAL_COMMUNITY): Payer: Self-pay | Admitting: Pharmacy Technician

## 2023-04-23 ENCOUNTER — Other Ambulatory Visit (HOSPITAL_COMMUNITY): Payer: Self-pay

## 2023-04-23 LAB — GLUCOSE, CAPILLARY: Glucose-Capillary: 118 mg/dL — ABNORMAL HIGH (ref 70–99)

## 2023-04-23 MED ORDER — CLOPIDOGREL BISULFATE 75 MG PO TABS
75.0000 mg | ORAL_TABLET | Freq: Every day | ORAL | Status: DC
  Administered 2023-04-23 – 2023-04-24 (×2): 75 mg via ORAL
  Filled 2023-04-23 (×2): qty 1

## 2023-04-23 MED ORDER — AMLODIPINE BESYLATE 5 MG PO TABS
5.0000 mg | ORAL_TABLET | Freq: Every day | ORAL | Status: DC
  Administered 2023-04-23 – 2023-04-24 (×2): 5 mg via ORAL
  Filled 2023-04-23 (×2): qty 1

## 2023-04-23 MED ORDER — ROSUVASTATIN CALCIUM 20 MG PO TABS
40.0000 mg | ORAL_TABLET | Freq: Every day | ORAL | Status: DC
  Filled 2023-04-23 (×2): qty 2

## 2023-04-23 MED ORDER — LISINOPRIL 20 MG PO TABS
20.0000 mg | ORAL_TABLET | Freq: Every day | ORAL | Status: DC
  Administered 2023-04-23 – 2023-04-24 (×2): 20 mg via ORAL
  Filled 2023-04-23: qty 2
  Filled 2023-04-23: qty 1

## 2023-04-23 MED ORDER — ASPIRIN 81 MG PO TBEC
81.0000 mg | DELAYED_RELEASE_TABLET | Freq: Every day | ORAL | Status: DC
  Administered 2023-04-23 – 2023-04-24 (×2): 81 mg via ORAL
  Filled 2023-04-23 (×2): qty 1

## 2023-04-23 NOTE — Progress Notes (Addendum)
STROKE TEAM PROGRESS NOTE   INTERVAL HISTORY His wife is at the bedside.  Vital signs stable overnight. Initially, patient reported that he was at his baseline neurologic status though later in the morning, PT notified stroke team that there was difficulty during the session with his right upper extremity and functional tasks, concern for right inattention, poor safety awareness, and needing +2 assistance in order to get patient to chair concerned about safety with patient discharge.  PT recommended CIR.  Discussed with family at bedside, patient's wife states that she wants another day of PT evaluation and does not feel safe going home at this moment.  Patient's wife was concerned that "the weakness that brought [place] in came back".  Will transfer patient out of ICU.  Also, patient states that he has statin intolerance and is refusing further statin therapy at this time.  Discussed Leqvio injection with patient and family pending insurance authorization. Form faxes to Leqvio.   Patient has reported right upper extremity proximal weakness due to rotator cuff tear and right leg weakness due to a "bad knee".  Right grip remains weaker, mild right facial droop persists.  MRI brain with multiple small foci of acute ischemia along the left precentral and postcentral gyri.  No hemorrhage or mass effect.  Old bilateral cerebellar small vessel infarcts and findings of chronic follows all ischemia.  Vitals:   04/23/23 0500 04/23/23 0600 04/23/23 0710 04/23/23 0718  BP: (!) 168/92 (!) 169/88 (!) 156/93   Pulse: 77 83 79   Resp: 17 (!) 25 16   Temp:    98.2 F (36.8 C)  TempSrc:    Oral  SpO2: 95% 95% 94%   Weight:      Height:       CBC:  Recent Labs  Lab 04/21/23 1800 04/21/23 1805 04/22/23 0204  WBC 9.9  --  10.5  NEUTROABS 5.6  --  6.7  HGB 14.2 13.9 14.2  HCT 42.1 41.0 41.1  MCV 90.5  --  88.6  PLT 261  --  256    Basic Metabolic Panel:  Recent Labs  Lab 04/21/23 1800 04/21/23 1805  04/22/23 0204  NA 138 140 137  K 3.3* 3.3* 4.2  CL 103 103 102  CO2 25  --  27  GLUCOSE 119* 118* 121*  BUN 13 15 11   CREATININE 1.25* 1.30* 1.14  CALCIUM 9.5  --  9.2  MG  --   --  2.0    Lipid Panel:  Recent Labs  Lab 04/22/23 0204  CHOL 289*  TRIG 100  HDL 50  CHOLHDL 5.8  VLDL 20  LDLCALC 161*    HgbA1c:  Recent Labs  Lab 04/21/23 1800  HGBA1C 6.1*    Urine Drug Screen: No results for input(s): "LABOPIA", "COCAINSCRNUR", "LABBENZ", "AMPHETMU", "THCU", "LABBARB" in the last 168 hours.  Alcohol Level  Recent Labs  Lab 04/21/23 1800  ETH <10    IMAGING past 24 hours MR BRAIN WO CONTRAST  Result Date: 04/22/2023 CLINICAL DATA:  Right-sided ataxia and weakness EXAM: MRI HEAD WITHOUT CONTRAST TECHNIQUE: Multiplanar, multiecho pulse sequences of the brain and surrounding structures were obtained without intravenous contrast. COMPARISON:  None Available. FINDINGS: Brain: There are multiple small foci of abnormal diffusion restriction along the left precentral and postcentral gyri. Minimal associated cytotoxic edema. No acute or chronic hemorrhage. There are old bilateral cerebellar small vessel infarcts. There is multifocal hyperintense T2-weighted signal within the periventricular and deep white matter. The midline structures  are normal. Vascular: Major flow voids are preserved. Skull and upper cervical spine: Normal calvarium and skull base. Visualized upper cervical spine and soft tissues are normal. Sinuses/Orbits:Right maxillary sinus mucosal thickening. Normal orbits. IMPRESSION: 1. Multiple small foci of acute ischemia along the left precentral and postcentral gyri. No hemorrhage or mass effect. 2. 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 Electronically Signed   By: Deatra Robinson M.D.   On: 04/22/2023 19:57   ECHOCARDIOGRAM COMPLETE  Result Date: 04/22/2023    ECHOCARDIOGRAM REPORT   Patient Name:   Ethan Payne  Date of Exam: 04/22/2023 Medical Rec #:  [id number]       Height:       69.0 in Accession #:    [id number]      Weight:       173.3 lb Date of Birth:  1946-10-27      BSA:          1.944 m Patient Age:    76 years        BP:           156/75 mmHg Patient Gender: M               HR:           85 bpm. Exam Location:  Inpatient Procedure: 2D Echo, Color Doppler and Cardiac Doppler Indications:    Stroke i63.9  History:        Patient has no prior history of Echocardiogram examinations.                 CAD, Arrythmias:Atrial Fibrillation; Risk Factors:Hypertension                 and Dyslipidemia. History of cardiac myxoma removed in 2009.  Sonographer:    Irving Burton Senior RDCS Referring Phys: Malachi Carl STACK IMPRESSIONS  1. Left ventricular ejection fraction, by estimation, is 55 to 60%. The left ventricle has normal function. The left ventricle has no regional wall motion abnormalities. There is mild concentric left ventricular hypertrophy of the septal segment. Left ventricular diastolic parameters were normal.  2. Right ventricular systolic function is normal. The right ventricular size is normal. Tricuspid regurgitation signal is inadequate for assessing PA pressure.  3. The mitral valve is grossly normal. Trivial mitral valve regurgitation. No evidence of mitral stenosis.  4. The aortic valve is tricuspid. Aortic valve regurgitation is trivial. Aortic valve sclerosis is present, with no evidence of aortic valve stenosis.  5. The inferior vena cava is normal in size with greater than 50% respiratory variability, suggesting right atrial pressure of 3 mmHg. Comparison(s): No evidence of left atrial mass on transthoracic echo. In view of patient's history of myxoma resection, consider TEE if a cardioembolic cause of stroke is suspected. FINDINGS  Left Ventricle: Left ventricular ejection fraction, by estimation, is 55 to 60%. The left ventricle has normal function. The left ventricle has no regional wall motion  abnormalities. The left ventricular internal cavity size was normal in size. There is  mild concentric left ventricular hypertrophy of the septal segment. Left ventricular diastolic parameters were normal. Right Ventricle: The right ventricular size is normal. No increase in right ventricular wall thickness. Right ventricular systolic function is normal. Tricuspid regurgitation signal is inadequate for assessing PA pressure. Left Atrium: Left atrial size was normal in size. Right Atrium: Right atrial size was normal in size. Pericardium: Trivial pericardial effusion is present. Presence of epicardial fat layer. Mitral  Valve: The mitral valve is grossly normal. Trivial mitral valve regurgitation. No evidence of mitral valve stenosis. Tricuspid Valve: The tricuspid valve is normal in structure. Tricuspid valve regurgitation is trivial. No evidence of tricuspid stenosis. Aortic Valve: The aortic valve is tricuspid. Aortic valve regurgitation is trivial. Aortic valve sclerosis is present, with no evidence of aortic valve stenosis. Pulmonic Valve: The pulmonic valve was normal in structure. Pulmonic valve regurgitation is trivial. No evidence of pulmonic stenosis. Aorta: The aortic root is normal in size and structure. Venous: The inferior vena cava is normal in size with greater than 50% respiratory variability, suggesting right atrial pressure of 3 mmHg. IAS/Shunts: No atrial level shunt detected by color flow Doppler.  LEFT VENTRICLE PLAX 2D LVIDd:         4.10 cm   Diastology LVIDs:         2.90 cm   LV e' medial:    10.00 cm/s LV PW:         1.00 cm   LV E/e' medial:  9.2 LV IVS:        1.20 cm   LV e' lateral:   13.40 cm/s LVOT diam:     2.00 cm   LV E/e' lateral: 6.9 LV SV:         52 LV SV Index:   27 LVOT Area:     3.14 cm  RIGHT VENTRICLE RV S prime:     13.70 cm/s TAPSE (M-mode): 1.9 cm LEFT ATRIUM             Index        RIGHT ATRIUM           Index LA diam:        3.10 cm 1.59 cm/m   RA Area:     19.10  cm LA Vol (A2C):   45.8 ml 23.56 ml/m  RA Volume:   55.50 ml  28.55 ml/m LA Vol (A4C):   38.3 ml 19.70 ml/m LA Biplane Vol: 42.0 ml 21.61 ml/m  AORTIC VALVE LVOT Vmax:   79.00 cm/s LVOT Vmean:  59.500 cm/s LVOT VTI:    0.167 m  AORTA Ao Root diam: 3.60 cm Ao Asc diam:  3.70 cm MITRAL VALVE MV Area (PHT): 3.06 cm    SHUNTS MV Decel Time: 248 msec    Systemic VTI:  0.17 m MV E velocity: 91.90 cm/s  Systemic Diam: 2.00 cm MV A velocity: 52.60 cm/s MV E/A ratio:  1.75 Mihai Croitoru MD Electronically signed by Thurmon Fair MD Signature Date/Time: 04/22/2023/1:29:17 PM    Final     PHYSICAL EXAM General: Sitting up in bedside chair, no acute distress HEENT: Atraumatic, normocephalic Respiratory: Unlabored breathing, room air CV: RRR, symmetric peripheral pulses Abdomen: Soft nontender  Neuro: MS: AOx4, follows commands. Speech: Fluid, no dysarthria or neglect present naming and repetition intact. CN: Visual fields full, EOMI without nystagmus, facial sensation intact, right-sided facial droop, hard of hearing at baseline, symmetrical head turn and shoulder shrug, tongue with midline protrusion Motor: RUE/RLE 4/5, 4/5 grip.   Sensation: Light touch intact. Coordination: Ataxia to RUE with finger-to-nose.  Rapid alternating movements intact heel-to-shin intact bilaterally Gait: Deferred  ASSESSMENT/PLAN Mr. SHIMON KANITZ is a 77 y.o. male with history of hypertension, hyperlipidemia Patient presented as code stroke 5/20 due to acute onset of right-sided weakness. Denied speech or vision issues.  CT showed no acute process.  Patient received TNK in the ED at 1823.  No intervention due  to no LVO on CTA.  Multiple small foci of acute ischemia along the left precentral and potential direct s/p TNK administration. Etiology:  Ischemia due to small vessel disease  Secondary risk factors include: Hypertension, hyperlipidemia, prediabetes Code Stroke CT head: No acute abnormality.  ASPECTS 10.     CTA head & neck: No LVO MRI: Multiple small foci of acute ischemia along the left precentral impression to try.  No hemorrhage or mass effect.  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 2D Echo: EF 55 to 60%, mild concentric LVH, trivial MVR, trivial AVR, no evidence of left atrial mass, no shunt LDL 219 HgbA1c 6.1 VTE prophylaxis - SCDs May need 30-day cardiac event monitor at discharge    Diet   Diet regular Room service appropriate? Yes; Fluid consistency: Thin   No antithrombotic prior to admission, now on aspirin 81 mg daily and clopidogrel 75 mg daily for 21 days followed by aspirin monotherapy  Therapy recommendations: CIR Disposition:  pending  Hypertension Home meds: Norvasc 5 mg, lisinopril 20 mg Stable Cleviprex gtt off since 5/21  Resume home medications  Long-term BP goal normotensive  Hyperlipidemia Home meds: None LDL 219, goal < 70 Patient reports statin intolerance and is refusing further statin therapy Discussed Leqvio injections with patient pending insurance authorization May need outpatient follow up with lipid clinic in the setting of hyperlipidemia and statin intolerance  Diabetes type II, Pre-Diabetic Home meds:  none HgbA1c 6.1, goal < 7.0 CBGs Recent Labs    04/21/23 2020 04/22/23 0317 04/23/23 0717  GLUCAP 113* 123* 118*     SSI Recommend close PCP follow-up as A1c is within prediabetic range  Other Stroke Risk Factors Advanced Age >/= 7  Hx stroke/TIA Family hx stroke (stroke)  Other Active Problems   Hospital day # 2  Pt seen by Neuro NP/APP and later by MD. Note/plan to be edited by MD as needed.    Lanae Boast, AGACNP-BC [facility] Pager: [phone] I have personally obtained history,examined this patient, reviewed notes, independently viewed imaging studies, participated in medical decision making and plan of care.ROS completed by me personally and  pertinent positives fully documented  I have made any additions or clarifications directly to the above note. Agree with note above.  Patient is doing well but had some right-sided weakness and neglect while working with therapist today who recommended inpatient rehab.  Mobilize out of bed.  Transfer to neurology stepdown bed..  Will consult inpatient rehab.  Long discussion with patient and wife and family at the bedside and answered questions.  Greater than 50% time during this 50-minute visit was spent on counseling and coordination of care about his stroke and discussion about weakness and rehabilitation needs needs and answering questions.  Delia Heady, MD Medical Director [facility] Pager: [phone] 04/23/2023 1:03 PM  To contact Stroke Continuity provider, please refer to [email or web address]. After hours, contact General Neurology

## 2023-04-23 NOTE — Evaluation (Signed)
Physical Therapy Evaluation Patient Details Name: Ethan Payne MRN: [id number] DOB: [birth year] Today's Date: 04/23/2023  History of Present Illness  Patient presented as code stroke 04/21/23 due to acute onset of right-sided weakness.  Patient was doing yard work then right hand became weak patient laid down on his hammock for around 15 minutes and when he went to get up he could not hold his cell phone and fell.  TNK given.  MRI showed Multiple small foci of acute ischemia along the left precentral  and postcentral gyri.  PMH: DM, HTN, hyperlipidemia, right knee pain  Clinical Impression  Pt admitted with above diagnosis. Pt was impaired in mobility needing up to mod assist for ambulation due to right LE deficits. Pt fatigues quickly, has poor safety awareness and appears to have some right inattention as well. Would benefit from therapy > 3hours post acute. Wife and pt concerned about pts' mobility at present. Nurse to make MD aware.  Pt currently with functional limitations due to the deficits listed below (see PT Problem List). Pt will benefit from acute skilled PT to increase their independence and safety with mobility to allow discharge.          Recommendations for follow up therapy are one component of a multi-disciplinary discharge planning process, led by the attending physician.  Recommendations may be updated based on patient status, additional functional criteria and insurance authorization.  Follow Up Recommendations       Assistance Recommended at Discharge Frequent or constant Supervision/Assistance  Patient can return home with the following  A lot of help with walking and/or transfers;A lot of help with bathing/dressing/bathroom;Assistance with cooking/housework;Assist for transportation;Help with stairs or ramp for entrance    Equipment Recommendations Rolling walker (2 wheels)  Recommendations for Other Services  Rehab consult    Functional Status Assessment Patient  has had a recent decline in their functional status and demonstrates the ability to make significant improvements in function in a reasonable and predictable amount of time.     Precautions / Restrictions Precautions Precautions: Fall Restrictions Weight Bearing Restrictions: No      Mobility  Bed Mobility               General bed mobility comments: sitting EOB on arrival    Transfers Overall transfer level: Needs assistance Equipment used: None Transfers: Sit to/from Stand Sit to Stand: Min guard           General transfer comment: Pt needed guard assist for safety as he has difficulty with coordination of right LE and right UE    Ambulation/Gait Ambulation/Gait assistance: Mod assist, +2 physical assistance Gait Distance (Feet): 50 Feet Assistive device: 1 person hand held assist, 2 person hand held assist Gait Pattern/deviations: Step-to pattern, Decreased step length - right, Decreased stance time - right, Decreased weight shift to right, Ataxic, Leaning posteriorly, Staggering left, Staggering right, Wide base of support   Gait velocity interpretation: <1.31 ft/sec, indicative of household ambulator   General Gait Details: Pt ambulated to door with +1 HHA with min assist initially with cues for safety as well as cues for sequencing steps as right LE with decr coordination at times. MD came and stopped pt in standing at door of room and talked to pt for up to 5 min.  When MD left, pt reported he didnt think he could make it back to chair as he has right knee problems and he felt that his leg was going to give out.  With +  2 mod assist pt was able to ambulate back to chair with HHA bilaterally and cues and assist for inc coordination of right LE.  Pt and wife concur that this happened to pt at home if he stood long time frames.  However after discussionand assessment of other deficits, pt and wife admit that pt is having incr deficits.  Stairs             Wheelchair Mobility    Modified Rankin (Stroke Patients Only)       Balance Overall balance assessment: Needs assistance Sitting-balance support: Feet supported Sitting balance-Leahy Scale: Good     Standing balance support: Single extremity supported, During functional activity Standing balance-Leahy Scale: Poor Standing balance comment: initially fair, progressed quickly to poor                             Pertinent Vitals/Pain Pain Assessment Pain Assessment: Faces Faces Pain Scale: Hurts little more Pain Location: R knee Pain Descriptors / Indicators: Grimacing, Discomfort, Guarding Pain Intervention(s): Limited activity within patient's tolerance, Monitored during session, Repositioned    Home Living Family/patient expects to be discharged to:: Private residence Living Arrangements: Spouse/significant other Available Help at Discharge: Family;Available 24 hours/day Type of Home: House Home Access: Stairs to enter Entrance Stairs-Rails: None Entrance Stairs-Number of Steps: 4   Home Layout: Two level;Able to live on main level with bedroom/bathroom Home Equipment: None (Pt has access to some DME from family)      Prior Function Prior Level of Function : Independent/Modified Independent;Driving             Mobility Comments: No issues with mobility PTA ADLs Comments: Independent with ADLs     Hand Dominance   Dominant Hand: Right    Extremity/Trunk Assessment   Upper Extremity Assessment Upper Extremity Assessment: Defer to OT evaluation RUE Deficits / Details: hx of rotator cuff injury, ROM is WFL. Strength is 4/5 throughout. Sensory motor coordination is significantly impaired, unable to touch thumb to fingers. Sensation is WFL. dysdiadochokinesia. Very poor dexterity - unable to manage pen, toothbrush or wallet RUE Sensation: decreased proprioception RUE Coordination: decreased fine motor;decreased gross motor    Lower Extremity  Assessment Lower Extremity Assessment: RLE deficits/detail RLE Deficits / Details: grossly 3+/5 RLE Sensation: decreased proprioception;decreased light touch RLE Coordination: decreased gross motor;decreased fine motor    Cervical / Trunk Assessment Cervical / Trunk Assessment: Kyphotic (mild)  Communication   Communication: Expressive difficulties  Cognition Arousal/Alertness: Awake/alert Behavior During Therapy: Flat affect Overall Cognitive Status: Impaired/Different from baseline Area of Impairment: Safety/judgement, Awareness, Problem solving                         Safety/Judgement: Decreased awareness of safety, Decreased awareness of deficits Awareness: Emergent Problem Solving: Difficulty sequencing, Requires verbal cues, Requires tactile cues, Decreased initiation General Comments: A&Ox4. anxious due to impairments. R inattention noted during functional tasks.  Poor safety awareness.        General Comments General comments (skin integrity, edema, etc.): BP elevated, wife present. Reports concern wtih d/c home. RN notified    Exercises     Assessment/Plan    PT Assessment Patient needs continued PT services  PT Problem List Decreased activity tolerance;Decreased balance;Decreased mobility;Decreased knowledge of use of DME;Decreased safety awareness;Decreased knowledge of precautions;Decreased strength;Decreased range of motion;Decreased coordination       PT Treatment Interventions DME instruction;Gait training;Stair training;Functional mobility training;Therapeutic activities;Therapeutic  exercise;Balance training;Patient/family education    PT Goals (Current goals can be found in the Care Plan section)  Acute Rehab PT Goals Patient Stated Goal: to go home PT Goal Formulation: With patient/family Time For Goal Achievement: 05/07/23 Potential to Achieve Goals: Good    Frequency Min 4X/week     Co-evaluation               AM-PAC PT "6  Clicks" Mobility  Outcome Measure Help needed turning from your back to your side while in a flat bed without using bedrails?: A Little Help needed moving from lying on your back to sitting on the side of a flat bed without using bedrails?: A Little Help needed moving to and from a bed to a chair (including a wheelchair)?: A Little Help needed standing up from a chair using your arms (e.g., wheelchair or bedside chair)?: A Little Help needed to walk in hospital room?: A Lot Help needed climbing 3-5 steps with a railing? : Total 6 Click Score: 15    End of Session Equipment Utilized During Treatment: Gait belt Activity Tolerance: Patient limited by fatigue Patient left: in chair;with call bell/phone within reach;with chair alarm set;with family/visitor present Nurse Communication: Mobility status PT Visit Diagnosis: Unsteadiness on feet (R26.81);Muscle weakness (generalized) (M62.81)    Time: 1610-9604 PT Time Calculation (min) (ACUTE ONLY): 24 min   Charges:   PT Evaluation $PT Eval Moderate Complexity: 1 Mod PT Treatments $Gait Training: 8-22 mins        [facility] M,PT Acute Rehab Services [phone]   Bevelyn Buckles 04/23/2023, 1:15 PM

## 2023-04-23 NOTE — Plan of Care (Signed)
  Problem: Education: Goal: Knowledge of disease or condition will improve Outcome: Progressing   Problem: Ischemic Stroke/TIA Tissue Perfusion: Goal: Complications of ischemic stroke/TIA will be minimized Outcome: Progressing   Problem: Coping: Goal: Will verbalize positive feelings about self Outcome: Progressing Goal: Will identify appropriate support needs Outcome: Progressing   Problem: Health Behavior/Discharge Planning: Goal: Ability to manage health-related needs will improve Outcome: Progressing Goal: Goals will be collaboratively established with patient/family Outcome: Progressing   Problem: Self-Care: Goal: Ability to communicate needs accurately will improve Outcome: Progressing   Problem: Nutrition: Goal: Risk of aspiration will decrease Outcome: Progressing Goal: Dietary intake will improve Outcome: Progressing   Problem: Education: Goal: Knowledge of General Education information will improve Description: Including pain rating scale, medication(s)/side effects and non-pharmacologic comfort measures Outcome: Progressing   Problem: Activity: Goal: Risk for activity intolerance will decrease Outcome: Progressing

## 2023-04-23 NOTE — Evaluation (Addendum)
Occupational Therapy Evaluation Patient Details Name: Ethan Payne MRN: [id number] DOB: [birth year] Today's Date: 04/23/2023   History of Present Illness Patient presented as code stroke 04/21/23 due to acute onset of right-sided weakness.  Patient was doing yard work then right hand became weak patient laid down on his hammock for around 15 minutes and when he went to get up he could not hold his cell phone and fell.  TNK given.  MRI showed Multiple small foci of acute ischemia along the left precentral  and postcentral gyri.  PMH: DM, HTN, hyperlipidemia, right knee pain   Clinical Impression   Ethan Payne was evaluated s/p the above admission list. He is active, independent and works at baseline. Upon evaluation he was significantly limited by RUE sensory motor coordination, proprioception, weakness, unsteady balance, limited insight, and poor activity tolerance. Overall he needed close min G for limited mobility with report of R knee instability and poor coordination noted. Due to the deficits listed below he also needs min A for all ADLs due to deficits of his dominant RUE. Pt will benefit from continued acute OT services and intensive inpatient follow up therapy, >3 hours/day after discharge to facilitate d/c home with his supportive family.         Recommendations for follow up therapy are one component of a multi-disciplinary discharge planning process, led by the attending physician.  Recommendations may be updated based on patient status, additional functional criteria and insurance authorization.   Assistance Recommended at Discharge Frequent or constant Supervision/Assistance  Patient can return home with the following A little help with walking and/or transfers;A little help with bathing/dressing/bathroom;Assistance with cooking/housework;Assistance with feeding;Help with stairs or ramp for entrance;Assist for transportation    Functional Status Assessment  Patient has had a recent  decline in their functional status and demonstrates the ability to make significant improvements in function in a reasonable and predictable amount of time.  Equipment Recommendations  Tub/shower bench;Other (comment) (RW)    Recommendations for Other Services Rehab consult     Precautions / Restrictions Precautions Precautions: Fall Restrictions Weight Bearing Restrictions: No      Mobility Bed Mobility Overal bed mobility: Needs Assistance             General bed mobility comments: OOB with PT upon arrival    Transfers Overall transfer level: Needs assistance Equipment used: None Transfers: Sit to/from Stand Sit to Stand: Min guard           General transfer comment: limited due to knee pain and fatigue after PT session      Balance Overall balance assessment: Needs assistance Sitting-balance support: Feet supported Sitting balance-Leahy Scale: Good     Standing balance support: Single extremity supported, During functional activity Standing balance-Leahy Scale: Poor Standing balance comment: initially fair, progressed quickly to poor                           ADL either performed or assessed with clinical judgement   ADL Overall ADL's : Needs assistance/impaired Eating/Feeding: Set up;Sitting Eating/Feeding Details (indicate cue type and reason): must use non-dom LUE Grooming: Set up;Sitting   Upper Body Bathing: Minimal assistance;Sitting   Lower Body Bathing: Minimal assistance;Sit to/from stand   Upper Body Dressing : Minimal assistance;Sitting   Lower Body Dressing: Sit to/from stand;Moderate assistance   Toilet Transfer: Min guard;Ambulation Toilet Transfer Details (indicate cue type and reason): close guard and limited tolerance Toileting- Clothing Manipulation and Hygiene: Min  guard;Sitting/lateral lean       Functional mobility during ADLs: Min guard General ADL Comments: pt must use his non-dom LUE for all functional  tasks. He has a very limited standing tolerance due to RLE incoordination and chronic knee pain     Vision Baseline Vision/History: 0 No visual deficits Vision Assessment?: Vision impaired- to be further tested in functional context Additional Comments: did not formally assess - R inattention noted. Would benefit from further evaluation     Perception Perception Perception Tested?: No   Praxis Praxis Praxis tested?: Not tested;Deficits Deficits: Limb apraxia Praxis-Other Comments: RUE    Pertinent Vitals/Pain Pain Assessment Pain Assessment: Faces Faces Pain Scale: Hurts little more Pain Location: R knee Pain Descriptors / Indicators: Grimacing, Discomfort, Guarding Pain Intervention(s): Limited activity within patient's tolerance, Monitored during session     Hand Dominance Right   Extremity/Trunk Assessment Upper Extremity Assessment Upper Extremity Assessment: RUE deficits/detail RUE Deficits / Details: hx of rotator cuff injury, ROM is WFL. Strength is 4/5 throughout. Sensory motor coordination is significantly impaired, unable to touch thumb to fingers. Sensation is WFL. dysdiadochokinesia. Very poor dexterity - unable to manage pen, toothbrush or wallet RUE Sensation: decreased proprioception RUE Coordination: decreased fine motor;decreased gross motor   Lower Extremity Assessment Lower Extremity Assessment: Defer to PT evaluation   Cervical / Trunk Assessment Cervical / Trunk Assessment: Kyphotic (mild)   Communication Communication Communication: Expressive difficulties   Cognition Arousal/Alertness: Awake/alert Behavior During Therapy: Flat affect Overall Cognitive Status: Impaired/Different from baseline Area of Impairment: Safety/judgement, Awareness, Problem solving                         Safety/Judgement: Decreased awareness of safety, Decreased awareness of deficits Awareness: Emergent Problem Solving: Difficulty sequencing, Requires verbal  cues, Requires tactile cues, Decreased initiation General Comments: A&Ox4. anxious due to impairments. R inattention noted during functional tasks.     General Comments  BP elevated, wife present. Reports concern wtih d/c home. RN notified    Exercises     Shoulder Instructions      Home Living Family/patient expects to be discharged to:: Private residence Living Arrangements: Spouse/significant other Available Help at Discharge: Family;Available 24 hours/day Type of Home: House Home Access: Stairs to enter [employer] of Steps: 4 Entrance Stairs-Rails: None Home Layout: Two level;Able to live on main level with bedroom/bathroom     Bathroom Shower/Tub: Chief Strategy Officer: Standard     Home Equipment: None (Pt has access to some DME from family)      Lives With: Spouse    Prior Functioning/Environment Prior Level of Function : Independent/Modified Independent;Driving             Mobility Comments: No issues with mobility PTA ADLs Comments: Independent with ADLs        OT Problem List: Decreased strength;Decreased range of motion;Decreased activity tolerance;Impaired balance (sitting and/or standing);Decreased safety awareness;Decreased knowledge of use of DME or AE;Decreased knowledge of precautions;Impaired UE functional use;Pain;Decreased coordination      OT Treatment/Interventions: Self-care/ADL training;Therapeutic exercise;Neuromuscular education;DME and/or AE instruction;Therapeutic activities;Patient/family education;Balance training    OT Goals(Current goals can be found in the care plan section) Acute Rehab OT Goals Patient Stated Goal: to stay in hospital OT Goal Formulation: With patient Time For Goal Achievement: 05/07/23 Potential to Achieve Goals: Good ADL Goals Pt Will Perform Grooming: with modified independence;standing Pt Will Perform Upper Body Dressing: with modified independence;sitting Pt Will Perform Lower  Body  Dressing: with set-up;sit to/from stand Pt Will Transfer to Toilet: ambulating Additional ADL Goal #1: Pt will complete RUE FM HEP with min A for neuro re-education  OT Frequency: Min 2X/week    Co-evaluation              AM-PAC OT "6 Clicks" Daily Activity     Outcome Measure Help from another person eating meals?: A Little Help from another person taking care of personal grooming?: A Little Help from another person toileting, which includes using toliet, bedpan, or urinal?: A Little Help from another person bathing (including washing, rinsing, drying)?: A Little Help from another person to put on and taking off regular upper body clothing?: A Little Help from another person to put on and taking off regular lower body clothing?: A Little 6 Click Score: 18   End of Session Equipment Utilized During Treatment: Gait belt Nurse Communication: Mobility status  Activity Tolerance: Patient tolerated treatment well Patient left: in chair;with call bell/phone within reach;with chair alarm set;with family/visitor present  OT Visit Diagnosis: Unsteadiness on feet (R26.81);Other abnormalities of gait and mobility (R26.89);Muscle weakness (generalized) (M62.81);Hemiplegia and hemiparesis;Pain Hemiplegia - Right/Left: Right Hemiplegia - dominant/non-dominant: Dominant Hemiplegia - caused by: Cerebral infarction                Time: 2952-8413 OT Time Calculation (min): 20 min Charges:  OT General Charges $OT Visit: 1 Visit OT Evaluation $OT Eval Moderate Complexity: 1 Mod  Derenda Mis, OTR/L Acute Rehabilitation Services Office [phone] Secure Chat Communication Preferred   Donia Pounds 04/23/2023, 12:13 PM

## 2023-04-23 NOTE — Progress Notes (Signed)
  Inpatient Rehabilitation Admissions Coordinator   Met with patient and son at bedside for rehab assessment. We discussed goals and expectations of a possible CIR admit. They prefer CIR for rehab. Family can provide expected caregiver support that is recommended of supervision level. I will begin insurance Auth with [facility] for possible CIR admit pending approval. 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 Very active and independent. Please call me with any questions.   Ottie Glazier, RN, MSN Rehab Admissions Coordinator [phone]

## 2023-04-23 NOTE — Progress Notes (Signed)
Inpatient Rehab Admissions Coordinator:  ? ?Per therapy recommendations,  patient was screened for CIR candidacy by Elon Eoff, MS, CCC-SLP. At this time, Pt. Appears to be a a potential candidate for CIR. I will place   order for rehab consult per protocol for full assessment. Please contact me any with questions. ? ?Alonzo Loving, MS, CCC-SLP ?Rehab Admissions Coordinator  ?[phone] (celll) ?[phone] (office) ? ?

## 2023-04-23 NOTE — Progress Notes (Signed)
Upon assessment of patient, this RN noted more prominence of the ataxia in the right arm, and the patient is no longer aware of the year. Spoke with Neurologist MD on call Derry Lory and no new orders received as patient is 24 hours out from receiving TNK.   Care ongoing.  Harriett Sine, RN

## 2023-04-23 NOTE — Telephone Encounter (Signed)
Pharmacy Patient Advocate Encounter  Insurance verification completed.    The patient is insured through [facility] Part D   The patient is currently admitted and ran test claims for the following: [employer].  Copays and coinsurance results were relayed to Inpatient clinical team.

## 2023-04-23 NOTE — PMR Pre-admission (Signed)
PMR Admission Coordinator Pre-Admission Assessment  Patient: Ethan Payne is an 77 y.o., male MRN: [id number] DOB: 1946-01-12 Height: 5\' 9"  (175.3 cm) Weight: 78.6 kg  Insurance Information HMO:     PPO: yes     PCP:      IPA:      80/20:      OTHER:  PRIMARY: Blue Medicare      Policy#: [id number]      Subscriber: pt CM Name: Carollee Herter      Phone#: [phone]     Fax#: [phone] Pre-Cert#: [id number]  approved for 7 days    Employer:  Benefits:  Phone #: [phone]     Name: 5/22 Eff. Date: 12/02/22     Deduct: none      Out of Pocket Max: $3150      Life Max: none CIR: $335 co pay per day days 1 until 5      SNF: no c copay days 1 until 20; $203 co pay per day days 21 until 60; no c copay days 61 until 100 Outpatient: $10 per visit     Co-Pay: visits per medical neccesity Home Health: 100%      Co-Pay: visits per medical neccesity DME: 80%     Co-Pay: 20% Providers: in network  SECONDARY: none  Financial Counselor:       Phone#:   The Data processing manager" for patients in Inpatient Rehabilitation Facilities with attached "Privacy Act Statement-Health Care Records" was provided and verbally reviewed with: Patient  Emergency Contact Information Contact Information     Name Relation Home Work Mobile   Kingsbury,Wanda Spouse [phone]     Potter,Michelle Daughter   [phone]      Current Medical History  Patient Admitting Diagnosis: CVA  History of Present Illness: 77 year old male with history of HTN,HLD, GERD with history of dilation, Paroxysmal atrial fibrillation with atrial myxoma removal 2010, and osteoarthritis right knee ( wears brace).  Presented on 04/21/23 with acute onset right sided weakness and incoordination while doing yard work.   Head CT with no acute process, CTA showed no LVO.  Imaging revealed multiple small foci of acute ischemia alond the left precentral . Old bilateral cerebellar small vessel infarcts and findings of chronic small  vessel ischemia. s/p TNK. Etiology felt to be due to small vessel disease. 2 d echo EF 55 to 60 % , mild concentric LVH, trivial MVR, trivial AVR, no evidence of left atrial mass, no shunt. LDL 219. Patient reports statin intolerance and refuses statins therapy. Discussed possible Leqvio injections.  Hgb A1c 6.1. Recommend 30 day cardiac event monitor at discharge. No antithrombotic pta, now on Asa and clopidogrel for 21 days followed by asa monotherapy. Home meds of Norvasc and lisinopril. Initially on Cleviprex gtt, now transitioned to home meds.   Complete NIHSS TOTAL: 3  Patient's medical record from [facility] has been reviewed by the rehabilitation admission coordinator and physician.  Past Medical History  Past Medical History:  Diagnosis Date   Acute prostatitis 02/04/2008   ATRIAL MYXOMA 02/04/2008   Closed fracture of four ribs 11/01/2009   HYPERLIPIDEMIA 02/04/2008   HYPERTENSION 02/04/2008   Osteoarthritis of right knee 04/18/2010   Has the patient had major surgery during 100 days prior to admission? No  Family History   family history includes Heart disease in his father; Hypertension in his father; Ovarian cancer in his sister; Stroke in his mother.  Current Medications  Current Facility-Administered Medications:  stroke: early stages of recovery book, , Does not apply, Once, Jefferson Fuel, MD   0.9 %  sodium chloride infusion, , Intravenous, Continuous, Jefferson Fuel, MD, Stopped at 04/22/23 1728   acetaminophen (TYLENOL) tablet 650 mg, 650 mg, Oral, Q4H PRN **OR** acetaminophen (TYLENOL) 160 MG/5ML solution 650 mg, 650 mg, Per Tube, Q4H PRN **OR** acetaminophen (TYLENOL) suppository 650 mg, 650 mg, Rectal, Q4H PRN, Jefferson Fuel, MD   amLODipine (NORVASC) tablet 5 mg, 5 mg, Oral, Daily, Toberman, Stevi W, NP, 5 mg at 04/24/23 1038   aspirin EC tablet 81 mg, 81 mg, Oral, Daily, Toberman, Stevi W, NP, 81 mg at 04/24/23 1038   clevidipine (CLEVIPREX) infusion 0.5  mg/mL, 0-21 mg/hr, Intravenous, Continuous, Jefferson Fuel, MD   clopidogrel (PLAVIX) tablet 75 mg, 75 mg, Oral, Daily, Toberman, Stevi W, NP, 75 mg at 04/24/23 1038   lisinopril (ZESTRIL) tablet 20 mg, 20 mg, Oral, Daily, Toberman, Stevi W, NP, 20 mg at 04/24/23 1038   Oral care mouth rinse, 15 mL, Mouth Rinse, PRN, Jefferson Fuel, MD   pantoprazole (PROTONIX) EC tablet 40 mg, 40 mg, Oral, QHS, Palikh, Gaurang M, MD, 40 mg at 04/23/23 2241   rosuvastatin (CRESTOR) tablet 40 mg, 40 mg, Oral, Daily, Toberman, Stevi W, NP   senna-docusate (Senokot-S) tablet 1 tablet, 1 tablet, Oral, QHS PRN, Jefferson Fuel, MD  Patients Current Diet:  Diet Order             Diet regular Room service appropriate? Yes; Fluid consistency: Thin  Diet effective now                  Precautions / Restrictions Precautions Precautions: Fall Restrictions Weight Bearing Restrictions: No   Has the patient had 2 or more falls or a fall with injury in the past year? 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 Independent  Indoor Mobility: Did the patient need assistance with walking from room to room (with or without device)? Independent  Stairs: Did the patient need assistance with internal or external stairs (with or without device)? Independent  Functional Cognition: Did the patient need help planning regular tasks such as shopping or remembering to take medications? Independent  Patient Information Are you of Hispanic, Latino/a,or Spanish origin?: A. No, not of Hispanic, Latino/a, or Spanish origin What is your race?: A. White Do you need or want an interpreter to communicate with a doctor or health care staff?: 0. No  Patient's Response To:  Health Literacy and Transportation Is  the patient able to respond to health literacy and transportation needs?: Yes Health Literacy - How often do you need to have someone help you when you read instructions, pamphlets, or other written material from your doctor or pharmacy?: Never In the past 12 months, has lack of transportation kept you from medical appointments or from getting medications?: No In the past 12 months, has lack of transportation kept you from meetings, work, or from getting things needed for daily living?: No  Journalist, newspaper / Equipment Home Assistive Devices/Equipment: Agricultural consultant, Eyeglasses Home Equipment: None (Pt has access to some DME from family)  Prior Device Use: Indicate devices/aids used by the patient prior to current illness, exacerbation or injury? None of the above  Current Functional Level Cognition  Overall Cognitive Status: Impaired/Different from baseline Orientation Level: Oriented X4 Safety/Judgement: Decreased awareness of safety, Decreased awareness of deficits General Comments: A&Ox4. anxious due to impairments. R inattention noted during functional tasks.  Poor safety awareness.    Extremity Assessment (includes Sensation/Coordination)  Upper Extremity Assessment: Defer to OT evaluation RUE Deficits / Details: hx of rotator cuff injury, ROM is WFL. Strength is 4/5 throughout. Sensory motor coordination is significantly impaired, unable to touch thumb to fingers. Sensation is WFL. dysdiadochokinesia. Very poor dexterity - unable to manage pen, toothbrush or wallet RUE Sensation: decreased proprioception RUE Coordination: decreased fine motor, decreased gross motor  Lower Extremity Assessment: RLE deficits/detail RLE Deficits / Details: grossly 3+/5 RLE Sensation: decreased proprioception, decreased light touch RLE Coordination: decreased gross motor, decreased fine motor    ADLs  Overall ADL's : Needs assistance/impaired Eating/Feeding: Set up, Sitting Eating/Feeding  Details (indicate cue type and reason): must use non-dom LUE Grooming: Set up, Sitting Upper Body Bathing: Minimal assistance, Sitting Lower Body Bathing: Minimal assistance, Sit to/from stand Upper Body Dressing : Minimal assistance, Sitting Lower Body Dressing: Sit to/from stand, Moderate assistance Toilet Transfer: Min guard, Ambulation Toilet Transfer Details (indicate cue type and reason): close guard and limited tolerance Toileting- Clothing Manipulation and Hygiene: Min guard, Sitting/lateral lean Functional mobility during ADLs: Min guard General ADL Comments: pt must use his non-dom LUE for all functional tasks. He has a very limited standing tolerance due to RLE incoordination and chronic knee pain    Mobility  Overal bed mobility: Needs Assistance General bed mobility comments: sitting EOB on arrival    Transfers  Overall transfer level: Needs assistance Equipment used: None Transfers: Sit to/from Stand Sit to Stand: Min guard General transfer comment: Pt needed guard assist for safety as he has difficulty with coordination of right LE and right UE    Ambulation / Gait / Stairs / Wheelchair Mobility  Ambulation/Gait Ambulation/Gait assistance: Mod assist, +2 physical assistance Gait Distance (Feet): 50 Feet Assistive device: 1 person hand held assist, 2 person hand held assist Gait Pattern/deviations: Step-to pattern, Decreased step length - right, Decreased stance time - right, Decreased weight shift to right, Ataxic, Leaning posteriorly, Staggering left, Staggering right, Wide base of support General Gait Details: Pt ambulated to door with +1 HHA with min assist initially with cues for safety as well as cues for sequencing steps as right LE with decr coordination at times. MD came and stopped pt in standing at door of room and talked to pt for up to 5 min.  When MD left, pt reported he didnt think he could make it back to chair as he has right knee problems and he felt that  his leg was going to give out.  With +2 mod assist pt was able to ambulate back to chair with HHA bilaterally and cues and assist for inc coordination of right LE.  Pt and wife concur that this happened to pt at home if he stood long time frames.  However after discussionand assessment of other deficits, pt and wife admit that pt is having incr deficits. Gait velocity interpretation: <1.31 ft/sec, indicative of household ambulator    Posture / Balance Balance Overall balance assessment: Needs assistance Sitting-balance support: Feet supported Sitting balance-Leahy Scale: Good Standing balance support: Single extremity supported, During functional activity Standing balance-Leahy Scale: Poor Standing balance comment: initially fair, progressed quickly to poor    Special needs/care consideration Hgb A1c 6.1 Recommend 30 day heart monitor at d/c home  Previous Home Environment  Living Arrangements: Spouse/significant other  Lives With: Spouse Available Help at Discharge: Family, Available 24 hours/day Type of Home: House Home Layout: Two level, Able to live on main level with bedroom/bathroom Home Access: Stairs to enter Entrance Stairs-Rails: None Entrance Stairs-Number of Steps: 4 Bathroom Shower/Tub: Associate Professor: No Home Care Services: No  Discharge Living Setting Plans for Discharge Living Setting: Patient's home, Lives with (comment) (wife) Type of Home at Discharge: House Discharge Home Layout: Two level, Able to live on main level with bedroom/bathroom Discharge Home Access: Stairs to enter Entrance Stairs-Rails: None Entrance Stairs-Number of Steps: 4 Discharge Bathroom Shower/Tub: Tub/shower unit Discharge Bathroom Toilet: Standard Discharge Bathroom Accessibility: Yes How Accessible: Accessible via walker Does the patient have any problems obtaining your medications?: No  Social/Family/Support Systems Patient Roles:  Spouse, Programme researcher, broadcasting/film/video (business owner) Solicitor Information: wife, Burna Mortimer Anticipated Caregiver: wife Anticipated Industrial/product designer Information: see contacts Ability/Limitations of Caregiver: none Caregiver Availability: 24/7 Discharge Plan Discussed with Primary Caregiver: Yes Is Caregiver In Agreement with Plan?: Yes Does Caregiver/Family have Issues with Lodging/Transportation while Pt is in Rehab?: No  Goals Patient/Family Goal for Rehab: Mod I to supervision wiht PT and OT Expected length of stay: ELOS 7 to 10 days Pt/Family Agrees to Admission and willing to participate: Yes Program Orientation Provided & Reviewed with Pt/Caregiver Including Roles  & Responsibilities: Yes  Decrease burden of Care through IP rehab admission: n/a  Possible need for SNF placement upon discharge: not anticipated  Patient Condition: I have reviewed medical records from [facility], spoken with CM, and patient and son. I met with patient at the bedside for inpatient rehabilitation assessment.  Patient will benefit from ongoing PT and OT, can actively participate in 3 hours of therapy a day 5 days of the week, and can make measurable gains during the admission.  Patient will also benefit from the coordinated team approach during an Inpatient Acute Rehabilitation admission.  The patient will receive intensive therapy as well as Rehabilitation physician, nursing, social worker, and care management interventions.  Due to bladder management, bowel management, safety, skin/wound care, disease management, medication administration, pain management, and patient education the patient requires 24 hour a day rehabilitation nursing.  The patient is currently mod assist overall with mobility and basic ADLs.  Discharge setting and therapy post discharge at home with outpatient is anticipated.  Patient has agreed to participate in the Acute Inpatient Rehabilitation Program and will admit today.  Preadmission Screen  Completed By:  Clois Dupes, RN MSN 04/24/2023 10:40 AM ______________________________________________________________________   Discussed status with Dr. Berline Chough on 04/24/23 at 1040 and received approval for admission today.  Admission Coordinator:  Clois Dupes, RN MSN time 1040 Date 04/24/23   Assessment/Plan: Diagnosis: R hemiparesis and inattention due to multiple L brain strokes Does the need for close, 24 hr/day Medical supervision in concert with the patient's rehab needs make it unreasonable for this patient to be served in a less intensive setting? Yes Co-Morbidities requiring supervision/potential complications: DM, HTN, HLD, R knee pain Due to bladder management, bowel management, safety, skin/wound care, disease management, medication administration, pain management, and patient education, does the patient require 24 hr/day rehab nursing? Yes Does the patient require coordinated care of a physician, rehab nurse, PT, OT, and SLP to address physical and functional deficits in the context of the above medical diagnosis(es)? Yes Addressing deficits in the following areas: balance, endurance, locomotion, strength, transferring, bowel/bladder control, bathing, dressing,  feeding, grooming, and toileting Can the patient actively participate in an intensive therapy program of at least 3 hrs of therapy 5 days a week? Yes The potential for patient to make measurable gains while on inpatient rehab is good Anticipated functional outcomes upon discharge from inpatient rehab: modified independent and supervision PT, modified independent and supervision OT, n/a SLP Estimated rehab length of stay to reach the above functional goals is: 7-10 days Anticipated discharge destination: Home 10. Overall Rehab/Functional Prognosis: good   MD Signature:

## 2023-04-23 NOTE — TOC Benefit Eligibility Note (Signed)
Patient Product/process development scientist completed.    The patient is currently admitted and upon discharge could be taking Leqvio 284 mg/1.5 ml sosy inj.  Product Not on Formulary  The patient is insured through [facility] Part D   This test claim was processed through [facility]- copay amounts may vary at other pharmacies due to pharmacy/plan contracts, or as the patient moves through the different stages of their insurance plan.  Roland Earl, CPHT Pharmacy Patient Advocate Specialist [facility] Patient Advocate Team Direct Number: [phone]  Fax: [phone]

## 2023-04-24 ENCOUNTER — Encounter (HOSPITAL_COMMUNITY): Payer: Self-pay | Admitting: Physical Medicine & Rehabilitation

## 2023-04-24 ENCOUNTER — Other Ambulatory Visit: Payer: Self-pay

## 2023-04-24 ENCOUNTER — Inpatient Hospital Stay (HOSPITAL_COMMUNITY)
Admission: RE | Admit: 2023-04-24 | Discharge: 2023-04-28 | DRG: 057 | Disposition: A | Discharge status: Home / Self Care | Payer: Medicare Other | Source: Intra-hospital | Attending: Physical Medicine & Rehabilitation | Admitting: Physical Medicine & Rehabilitation

## 2023-04-24 DIAGNOSIS — M25511 Pain in right shoulder: Secondary | ICD-10-CM | POA: Diagnosis not present

## 2023-04-24 DIAGNOSIS — Z885 Allergy status to narcotic agent status: Secondary | ICD-10-CM | POA: Diagnosis not present

## 2023-04-24 DIAGNOSIS — E860 Dehydration: Secondary | ICD-10-CM | POA: Diagnosis not present

## 2023-04-24 DIAGNOSIS — M1711 Unilateral primary osteoarthritis, right knee: Secondary | ICD-10-CM | POA: Diagnosis present

## 2023-04-24 DIAGNOSIS — I69322 Dysarthria following cerebral infarction: Secondary | ICD-10-CM | POA: Diagnosis not present

## 2023-04-24 DIAGNOSIS — R29818 Other symptoms and signs involving the nervous system: Secondary | ICD-10-CM | POA: Diagnosis not present

## 2023-04-24 DIAGNOSIS — E119 Type 2 diabetes mellitus without complications: Secondary | ICD-10-CM | POA: Diagnosis present

## 2023-04-24 DIAGNOSIS — I639 Cerebral infarction, unspecified: Principal | ICD-10-CM

## 2023-04-24 DIAGNOSIS — R200 Anesthesia of skin: Secondary | ICD-10-CM | POA: Diagnosis not present

## 2023-04-24 DIAGNOSIS — Z951 Presence of aortocoronary bypass graft: Secondary | ICD-10-CM | POA: Diagnosis not present

## 2023-04-24 DIAGNOSIS — I10 Essential (primary) hypertension: Secondary | ICD-10-CM | POA: Diagnosis not present

## 2023-04-24 DIAGNOSIS — Z79899 Other long term (current) drug therapy: Secondary | ICD-10-CM

## 2023-04-24 DIAGNOSIS — I69351 Hemiplegia and hemiparesis following cerebral infarction affecting right dominant side: Principal | ICD-10-CM

## 2023-04-24 DIAGNOSIS — Z8249 Family history of ischemic heart disease and other diseases of the circulatory system: Secondary | ICD-10-CM | POA: Diagnosis not present

## 2023-04-24 DIAGNOSIS — E785 Hyperlipidemia, unspecified: Secondary | ICD-10-CM | POA: Diagnosis not present

## 2023-04-24 DIAGNOSIS — Z823 Family history of stroke: Secondary | ICD-10-CM | POA: Diagnosis not present

## 2023-04-24 DIAGNOSIS — M4802 Spinal stenosis, cervical region: Secondary | ICD-10-CM | POA: Diagnosis present

## 2023-04-24 DIAGNOSIS — I69392 Facial weakness following cerebral infarction: Secondary | ICD-10-CM

## 2023-04-24 DIAGNOSIS — Z888 Allergy status to other drugs, medicaments and biological substances status: Secondary | ICD-10-CM

## 2023-04-24 DIAGNOSIS — I480 Paroxysmal atrial fibrillation: Secondary | ICD-10-CM | POA: Diagnosis not present

## 2023-04-24 DIAGNOSIS — M75101 Unspecified rotator cuff tear or rupture of right shoulder, not specified as traumatic: Secondary | ICD-10-CM | POA: Diagnosis present

## 2023-04-24 DIAGNOSIS — K219 Gastro-esophageal reflux disease without esophagitis: Secondary | ICD-10-CM | POA: Diagnosis not present

## 2023-04-24 MED ORDER — LISINOPRIL 20 MG PO TABS
20.0000 mg | ORAL_TABLET | Freq: Every day | ORAL | Status: DC
  Administered 2023-04-25: 20 mg via ORAL
  Filled 2023-04-24: qty 1

## 2023-04-24 MED ORDER — ASPIRIN 81 MG PO TBEC
81.0000 mg | DELAYED_RELEASE_TABLET | Freq: Every day | ORAL | Status: DC
  Administered 2023-04-25 – 2023-04-28 (×4): 81 mg via ORAL
  Filled 2023-04-24 (×4): qty 1

## 2023-04-24 MED ORDER — GUAIFENESIN-DM 100-10 MG/5ML PO SYRP: 10.0000 mL | ORAL_SOLUTION | Freq: Four times a day (QID) | ORAL | Status: DC | PRN

## 2023-04-24 MED ORDER — CLOPIDOGREL BISULFATE 75 MG PO TABS: 75.0000 mg | ORAL_TABLET | Freq: Every day | ORAL | 1 refills | Status: DC

## 2023-04-24 MED ORDER — ORAL CARE MOUTH RINSE: 15.0000 mL | OROMUCOSAL | Status: DC | PRN

## 2023-04-24 MED ORDER — ROSUVASTATIN CALCIUM 20 MG PO TABS
40.0000 mg | ORAL_TABLET | Freq: Every day | ORAL | Status: DC
  Administered 2023-04-25 – 2023-04-28 (×4): 40 mg via ORAL
  Filled 2023-04-24 (×4): qty 2

## 2023-04-24 MED ORDER — ONDANSETRON HCL 4 MG PO TABS: 4.0000 mg | ORAL_TABLET | Freq: Four times a day (QID) | ORAL | Status: DC | PRN

## 2023-04-24 MED ORDER — SORBITOL 70 % SOLN: 30.0000 mL | Freq: Every day | Status: DC | PRN

## 2023-04-24 MED ORDER — ALUM & MAG HYDROXIDE-SIMETH 200-200-20 MG/5ML PO SUSP: 30.0000 mL | ORAL | Status: DC | PRN

## 2023-04-24 MED ORDER — ENOXAPARIN SODIUM 40 MG/0.4ML IJ SOSY
40.0000 mg | PREFILLED_SYRINGE | INTRAMUSCULAR | Status: DC
  Filled 2023-04-24 (×2): qty 0.4

## 2023-04-24 MED ORDER — CLOPIDOGREL BISULFATE 75 MG PO TABS
75.0000 mg | ORAL_TABLET | Freq: Every day | ORAL | Status: DC
  Administered 2023-04-25 – 2023-04-28 (×4): 75 mg via ORAL
  Filled 2023-04-24 (×4): qty 1

## 2023-04-24 MED ORDER — PANTOPRAZOLE SODIUM 40 MG PO TBEC
40.0000 mg | DELAYED_RELEASE_TABLET | Freq: Every day | ORAL | Status: DC
  Administered 2023-04-24 – 2023-04-27 (×4): 40 mg via ORAL
  Filled 2023-04-24 (×4): qty 1

## 2023-04-24 MED ORDER — ONDANSETRON HCL 4 MG/2ML IJ SOLN: 4.0000 mg | Freq: Four times a day (QID) | INTRAMUSCULAR | Status: DC | PRN

## 2023-04-24 MED ORDER — AMLODIPINE BESYLATE 5 MG PO TABS
5.0000 mg | ORAL_TABLET | Freq: Every day | ORAL | Status: DC
  Administered 2023-04-25: 5 mg via ORAL
  Filled 2023-04-24: qty 1

## 2023-04-24 MED ORDER — TRAZODONE HCL 50 MG PO TABS
50.0000 mg | ORAL_TABLET | Freq: Every evening | ORAL | Status: DC | PRN
  Filled 2023-04-24: qty 1

## 2023-04-24 MED ORDER — POLYETHYLENE GLYCOL 3350 17 G PO PACK: 17.0000 g | PACK | Freq: Every day | ORAL | Status: DC | PRN

## 2023-04-24 MED ORDER — METHOCARBAMOL 500 MG PO TABS: 500.0000 mg | ORAL_TABLET | Freq: Four times a day (QID) | ORAL | Status: DC | PRN

## 2023-04-24 MED ORDER — ACETAMINOPHEN 325 MG PO TABS: 325.0000 mg | ORAL_TABLET | ORAL | Status: DC | PRN

## 2023-04-24 MED ORDER — ASPIRIN 81 MG PO TBEC: 81.0000 mg | DELAYED_RELEASE_TABLET | Freq: Every day | ORAL | 12 refills | Status: DC

## 2023-04-24 NOTE — Progress Notes (Signed)
Inpatient Rehabilitation Admissions Coordinator   I have insurance approval and Cir bed to admit him to today. I met at bedside with patient , wife and daughter. All in agreement. I will alert acute team , TOC and Dr Pearlean Brownie is aware. I will make the arrangements.  Ottie Glazier, RN, MSN Rehab Admissions Coordinator [phone] 04/24/2023 10:36 AM

## 2023-04-24 NOTE — Progress Notes (Signed)
Genice Rouge, MD  Physician Physical Medicine and Rehabilitation   PMR Pre-admission    Signed   Date of Service: 04/23/2023  4:02 PM  Related encounter: ED to Hosp-Admission (Discharged) from 04/21/2023 in [facility]   Signed      Show:Clear all [x] Written[x] Templated[] Copied  Added by: [x] Standley Brooking, RN[x] Genice Rouge, MD  [] Hover for details PMR Admission Coordinator Pre-Admission Assessment   Patient: Ethan Payne is an 77 y.o., male MRN: [id number] DOB: 19-Feb-1946 Height: 5\' 9"  (175.3 cm) Weight: 78.6 kg   Insurance Information HMO:     PPO: yes     PCP:      IPA:      80/20:      OTHER:  PRIMARY: Blue Medicare      Policy#: [id number]      Subscriber: pt CM Name: Shanda Bumps     Phone#: [phone]    Fax#: [phone] Pre-Cert#: [id number]  approved for 7 days    Employer:  Benefits:  Phone #: [phone]     Name: 5/22 Eff. Date: 12/02/22     Deduct: none      Out of Pocket Max: $3150      Life Max: none CIR: $335 co pay per day days 1 until 5      SNF: no c copay days 1 until 20; $203 co pay per day days 21 until 60; no c copay days 61 until 100 Outpatient: $10 per visit     Co-Pay: visits per medical neccesity Home Health: 100%      Co-Pay: visits per medical neccesity DME: 80%     Co-Pay: 20% Providers: in network  SECONDARY: none   Financial Counselor:       Phone#:    The Data processing manager" for patients in Inpatient Rehabilitation Facilities with attached "Privacy Act Statement-Health Care Records" was provided and verbally reviewed with: Patient   Emergency Contact Information Contact Information       Name Relation Home Work Mobile    Blacksher,Wanda Spouse [phone]        Potter,Michelle Daughter     [phone]         Current Medical History  Patient Admitting Diagnosis: CVA   History of Present Illness: 77 year old male with history of HTN,HLD, GERD with history of dilation,  Paroxysmal atrial fibrillation with atrial myxoma removal 2010, and osteoarthritis right knee ( wears brace).  Presented on 04/21/23 with acute onset right sided weakness and incoordination while doing yard work.    Head CT with no acute process, CTA showed no LVO.  Imaging revealed multiple small foci of acute ischemia alond the left precentral . 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 s/p TNK. Etiology felt to be due to small vessel disease. 2 d echo EF 55 to 60 % , mild concentric LVH, trivial MVR, trivial AVR, no evidence of left atrial mass, no shunt. LDL 219. Patient reports statin intolerance and refuses statins therapy. Discussed possible Leqvio injections.  Hgb A1c 6.1. Recommend 30 day cardiac event monitor at discharge. No antithrombotic pta, now on Asa and clopidogrel for 21 days followed by asa monotherapy. Home meds of Norvasc and lisinopril. Initially on Cleviprex gtt, now transitioned to home meds.    Complete NIHSS TOTAL: 3   Patient's medical record from [facility] has been reviewed by the rehabilitation admission coordinator and physician.   Past Medical History  Past Medical History:  Diagnosis Date   Acute prostatitis 02/04/2008   ATRIAL MYXOMA 02/04/2008   Closed fracture of four ribs 11/01/2009   HYPERLIPIDEMIA 02/04/2008   HYPERTENSION 02/04/2008   Osteoarthritis of right knee 04/18/2010    Has the patient had major surgery during 100 days prior to admission? No   Family History   family history includes Heart disease in his father; Hypertension in his father; Ovarian cancer in his sister; Stroke in his mother.   Current Medications   Current Facility-Administered Medications:     stroke: early stages of recovery book, , Does not apply, Once, Jefferson Fuel, MD   0.9 %  sodium chloride infusion, , Intravenous, Continuous, Jefferson Fuel, MD, Stopped at 04/22/23 1728   acetaminophen (TYLENOL) tablet 650  mg, 650 mg, Oral, Q4H PRN **OR** acetaminophen (TYLENOL) 160 MG/5ML solution 650 mg, 650 mg, Per Tube, Q4H PRN **OR** acetaminophen (TYLENOL) suppository 650 mg, 650 mg, Rectal, Q4H PRN, Jefferson Fuel, MD   amLODipine (NORVASC) tablet 5 mg, 5 mg, Oral, Daily, Toberman, Stevi W, NP, 5 mg at 04/24/23 1038   aspirin EC tablet 81 mg, 81 mg, Oral, Daily, Toberman, Stevi W, NP, 81 mg at 04/24/23 1038   clevidipine (CLEVIPREX) infusion 0.5 mg/mL, 0-21 mg/hr, Intravenous, Continuous, Jefferson Fuel, MD   clopidogrel (PLAVIX) tablet 75 mg, 75 mg, Oral, Daily, Toberman, Stevi W, NP, 75 mg at 04/24/23 1038   lisinopril (ZESTRIL) tablet 20 mg, 20 mg, Oral, Daily, Toberman, Stevi W, NP, 20 mg at 04/24/23 1038   Oral care mouth rinse, 15 mL, Mouth Rinse, PRN, Jefferson Fuel, MD   pantoprazole (PROTONIX) EC tablet 40 mg, 40 mg, Oral, QHS, Palikh, Gaurang M, MD, 40 mg at 04/23/23 2241   rosuvastatin (CRESTOR) tablet 40 mg, 40 mg, Oral, Daily, Toberman, Stevi W, NP   senna-docusate (Senokot-S) tablet 1 tablet, 1 tablet, Oral, QHS PRN, Jefferson Fuel, MD   Patients Current Diet:  Diet Order                  Diet regular Room service appropriate? Yes; Fluid consistency: Thin  Diet effective now                       Precautions / Restrictions Precautions Precautions: Fall Restrictions Weight Bearing Restrictions: No    Has the patient had 2 or more falls or a fall with injury in the past year? 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 Independent   Indoor Mobility: Did the patient need assistance with walking from room to room (with or without device)? Independent   Stairs: Did the patient need assistance with internal or external stairs (with or without device)?  Independent   Functional Cognition: Did the patient need help planning regular tasks such as shopping or remembering to take medications? Independent   Patient Information Are you of Hispanic, Latino/a,or Spanish origin?: A. No, not of Hispanic, Latino/a, or Spanish origin What is your race?: A. White Do you need or want an interpreter to communicate with a doctor or health care staff?: 0. No   Patient's Response To:  Health Literacy and Transportation Is the patient able to respond to health literacy and transportation needs?: Yes Health Literacy - How often  do you need to have someone help you when you read instructions, pamphlets, or other written material from your doctor or pharmacy?: Never In the past 12 months, has lack of transportation kept you from medical appointments or from getting medications?: No In the past 12 months, has lack of transportation kept you from meetings, work, or from getting things needed for daily living?: No   Journalist, newspaper / Equipment Home Assistive Devices/Equipment: Agricultural consultant, Eyeglasses Home Equipment: None (Pt has access to some DME from family)   Prior Device Use: Indicate devices/aids used by the patient prior to current illness, exacerbation or injury? None of the above   Current Functional Level Cognition   Overall Cognitive Status: Impaired/Different from baseline Orientation Level: Oriented X4 Safety/Judgement: Decreased awareness of safety, Decreased awareness of deficits General Comments: A&Ox4. anxious due to impairments. R inattention noted during functional tasks.  Poor safety awareness.    Extremity Assessment (includes Sensation/Coordination)   Upper Extremity Assessment: Defer to OT evaluation RUE Deficits / Details: hx of rotator cuff injury, ROM is WFL. Strength is 4/5 throughout. Sensory motor coordination is significantly impaired, unable to touch thumb to fingers. Sensation is WFL. dysdiadochokinesia. Very poor  dexterity - unable to manage pen, toothbrush or wallet RUE Sensation: decreased proprioception RUE Coordination: decreased fine motor, decreased gross motor  Lower Extremity Assessment: RLE deficits/detail RLE Deficits / Details: grossly 3+/5 RLE Sensation: decreased proprioception, decreased light touch RLE Coordination: decreased gross motor, decreased fine motor     ADLs   Overall ADL's : Needs assistance/impaired Eating/Feeding: Set up, Sitting Eating/Feeding Details (indicate cue type and reason): must use non-dom LUE Grooming: Set up, Sitting Upper Body Bathing: Minimal assistance, Sitting Lower Body Bathing: Minimal assistance, Sit to/from stand Upper Body Dressing : Minimal assistance, Sitting Lower Body Dressing: Sit to/from stand, Moderate assistance Toilet Transfer: Min guard, Ambulation Toilet Transfer Details (indicate cue type and reason): close guard and limited tolerance Toileting- Clothing Manipulation and Hygiene: Min guard, Sitting/lateral lean Functional mobility during ADLs: Min guard General ADL Comments: pt must use his non-dom LUE for all functional tasks. He has a very limited standing tolerance due to RLE incoordination and chronic knee pain     Mobility   Overal bed mobility: Needs Assistance General bed mobility comments: sitting EOB on arrival     Transfers   Overall transfer level: Needs assistance Equipment used: None Transfers: Sit to/from Stand Sit to Stand: Min guard General transfer comment: Pt needed guard assist for safety as he has difficulty with coordination of right LE and right UE     Ambulation / Gait / Stairs / Wheelchair Mobility   Ambulation/Gait Ambulation/Gait assistance: Mod assist, +2 physical assistance Gait Distance (Feet): 50 Feet Assistive device: 1 person hand held assist, 2 person hand held assist Gait Pattern/deviations: Step-to pattern, Decreased step length - right, Decreased stance time - right, Decreased weight shift  to right, Ataxic, Leaning posteriorly, Staggering left, Staggering right, Wide base of support General Gait Details: Pt ambulated to door with +1 HHA with min assist initially with cues for safety as well as cues for sequencing steps as right LE with decr coordination at times. MD came and stopped pt in standing at door of room and talked to pt for up to 5 min.  When MD left, pt reported he didnt think he could make it back to chair as he has right knee problems and he felt that his leg was going to give out.  With +2 mod  assist pt was able to ambulate back to chair with HHA bilaterally and cues and assist for inc coordination of right LE.  Pt and wife concur that this happened to pt at home if he stood long time frames.  However after discussionand assessment of other deficits, pt and wife admit that pt is having incr deficits. Gait velocity interpretation: <1.31 ft/sec, indicative of household ambulator     Posture / Balance Balance Overall balance assessment: Needs assistance Sitting-balance support: Feet supported Sitting balance-Leahy Scale: Good Standing balance support: Single extremity supported, During functional activity Standing balance-Leahy Scale: Poor Standing balance comment: initially fair, progressed quickly to poor     Special needs/care consideration Hgb A1c 6.1 Recommend 30 day heart monitor at d/c home    Previous Home Environment  Living Arrangements: Spouse/significant other  Lives With: Spouse Available Help at Discharge: Family, Available 24 hours/day Type of Home: House Home Layout: Two level, Able to live on main level with bedroom/bathroom Home Access: Stairs to enter Entrance Stairs-Rails: None Entrance Stairs-Number of Steps: 4 Bathroom Shower/Tub: Associate Professor: No Home Care Services: No   Discharge Living Setting Plans for Discharge Living Setting: Patient's home, Lives with (comment) (wife) Type of Home  at Discharge: House Discharge Home Layout: Two level, Able to live on main level with bedroom/bathroom Discharge Home Access: Stairs to enter Entrance Stairs-Rails: None Entrance Stairs-Number of Steps: 4 Discharge Bathroom Shower/Tub: Tub/shower unit Discharge Bathroom Toilet: Standard Discharge Bathroom Accessibility: Yes How Accessible: Accessible via walker Does the patient have any problems obtaining your medications?: No   Social/Family/Support Systems Patient Roles: Spouse, Programme researcher, broadcasting/film/video (business owner) Solicitor Information: wife, Burna Mortimer Anticipated Caregiver: wife Anticipated Industrial/product designer Information: see contacts Ability/Limitations of Caregiver: none Caregiver Availability: 24/7 Discharge Plan Discussed with Primary Caregiver: Yes Is Caregiver In Agreement with Plan?: Yes Does Caregiver/Family have Issues with Lodging/Transportation while Pt is in Rehab?: No   Goals Patient/Family Goal for Rehab: Mod I to supervision wiht PT and OT Expected length of stay: ELOS 7 to 10 days Pt/Family Agrees to Admission and willing to participate: Yes Program Orientation Provided & Reviewed with Pt/Caregiver Including Roles  & Responsibilities: Yes   Decrease burden of Care through IP rehab admission: n/a   Possible need for SNF placement upon discharge: not anticipated   Patient Condition: I have reviewed medical records from [facility], spoken with CM, and patient and son. I met with patient at the bedside for inpatient rehabilitation assessment.  Patient will benefit from ongoing PT and OT, can actively participate in 3 hours of therapy a day 5 days of the week, and can make measurable gains during the admission.  Patient will also benefit from the coordinated team approach during an Inpatient Acute Rehabilitation admission.  The patient will receive intensive therapy as well as Rehabilitation physician, nursing, social worker, and care management interventions.  Due to bladder  management, bowel management, safety, skin/wound care, disease management, medication administration, pain management, and patient education the patient requires 24 hour a day rehabilitation nursing.  The patient is currently mod assist overall with mobility and basic ADLs.  Discharge setting and therapy post discharge at home with outpatient is anticipated.  Patient has agreed to participate in the Acute Inpatient Rehabilitation Program and will admit today.   Preadmission Screen Completed By:  Clois Dupes, RN MSN 04/24/2023 10:40 AM ______________________________________________________________________   Discussed status with Dr. Berline Chough on 04/24/23 at 1040 and received approval for admission today.  Admission Coordinator:  Clois Dupes, RN MSN time 1040 Date 04/24/23    Assessment/Plan: Diagnosis: R hemiparesis and inattention due to multiple L brain strokes Does the need for close, 24 hr/day Medical supervision in concert with the patient's rehab needs make it unreasonable for this patient to be served in a less intensive setting? Yes Co-Morbidities requiring supervision/potential complications: DM, HTN, HLD, R knee pain Due to bladder management, bowel management, safety, skin/wound care, disease management, medication administration, pain management, and patient education, does the patient require 24 hr/day rehab nursing? Yes Does the patient require coordinated care of a physician, rehab nurse, PT, OT, and SLP to address physical and functional deficits in the context of the above medical diagnosis(es)? Yes Addressing deficits in the following areas: balance, endurance, locomotion, strength, transferring, bowel/bladder control, bathing, dressing, feeding, grooming, and toileting Can the patient actively participate in an intensive therapy program of at least 3 hrs of therapy 5 days a week? Yes The potential for patient to make measurable gains while on inpatient rehab is  good Anticipated functional outcomes upon discharge from inpatient rehab: modified independent and supervision PT, modified independent and supervision OT, n/a SLP Estimated rehab length of stay to reach the above functional goals is: 7-10 days Anticipated discharge destination: Home 10. Overall Rehab/Functional Prognosis: good     MD Signature:           Revision History

## 2023-04-24 NOTE — Progress Notes (Signed)
STROKE TEAM PROGRESS NOTE   INTERVAL HISTORY His wife and daughter are   at the bedside.   Patient is sitting up in bedside chair.  He is doing better.  Right-sided strength and coordination seem much improved.  There is no neglect.  Speech also is quite clear without aphasia.  He has been seen by inpatient rehab team and found to be a good candidate to go to rehab.  Is medically stable to transfer to rehab when bed available.  Vital signs stable.  Vitals:   04/24/23 0330 04/24/23 0727 04/24/23 1037 04/24/23 1231  BP: 116/77 (!) 142/78 (!) 146/84 120/77  Pulse: 92 88 91 76  Resp: 18 18  17   Temp: 98.4 F (36.9 C) 98.2 F (36.8 C)  98 F (36.7 C)  TempSrc: Oral Oral  Oral  SpO2: 95% 100% 97% 99%  Weight:      Height:       CBC:  Recent Labs  Lab 04/21/23 1800 04/21/23 1805 04/22/23 0204  WBC 9.9  --  10.5  NEUTROABS 5.6  --  6.7  HGB 14.2 13.9 14.2  HCT 42.1 41.0 41.1  MCV 90.5  --  88.6  PLT 261  --  256   Basic Metabolic Panel:  Recent Labs  Lab 04/21/23 1800 04/21/23 1805 04/22/23 0204  NA 138 140 137  K 3.3* 3.3* 4.2  CL 103 103 102  CO2 25  --  27  GLUCOSE 119* 118* 121*  BUN 13 15 11   CREATININE 1.25* 1.30* 1.14  CALCIUM 9.5  --  9.2  MG  --   --  2.0   Lipid Panel:  Recent Labs  Lab 04/22/23 0204  CHOL 289*  TRIG 100  HDL 50  CHOLHDL 5.8  VLDL 20  LDLCALC 161*   HgbA1c:  Recent Labs  Lab 04/21/23 1800  HGBA1C 6.1*   Urine Drug Screen: No results for input(s): "LABOPIA", "COCAINSCRNUR", "LABBENZ", "AMPHETMU", "THCU", "LABBARB" in the last 168 hours.  Alcohol Level  Recent Labs  Lab 04/21/23 1800  ETH <10   IMAGING past 24 hours No results found.  PHYSICAL EXAM General: Sitting up in bedside chair, no acute distress HEENT: Atraumatic, normocephalic Respiratory: Unlabored breathing, room air CV: RRR, symmetric peripheral pulses Abdomen: Soft nontender  Neuro: MS: AOx4, follows commands. Speech: Fluid, no dysarthria or neglect  present naming and repetition intact. CN: Visual fields full, EOMI without nystagmus, facial sensation intact, right-sided facial droop, hard of hearing at baseline, symmetrical head turn and shoulder shrug, tongue with midline protrusion Motor: RUE/RLE 4/5, 4/5 grip.   Sensation: Light touch intact. Coordination: Ataxia to RUE with finger-to-nose.  Rapid alternating movements intact heel-to-shin intact bilaterally Gait: Deferred  ASSESSMENT/PLAN Ethan Payne is a 77 y.o. male with history of hypertension, hyperlipidemia Patient presented as code stroke 5/20 due to acute onset of right-sided weakness. Denied speech or vision issues.  CT showed no acute process.  Patient received TNK in the ED at 1823.  No intervention due to no LVO on CTA.  Multiple small foci of acute ischemia along the left precentral and potential direct s/p TNK administration. Etiology:  Ischemia due to small vessel disease  Secondary risk factors include: Hypertension, hyperlipidemia, prediabetes Code Stroke CT head: No acute abnormality.  ASPECTS 10.    CTA head & neck: No LVO MRI: Multiple small foci of acute ischemia along the left precentral impression to try.  No hemorrhage or mass effect.  Old bilateral cerebellar small  vessel infarcts and findings of chronic small vessel ischemia. 2D Echo: EF 55 to 60%, mild concentric LVH, trivial MVR, trivial AVR, no evidence of left atrial mass, no shunt LDL 219 HgbA1c 6.1 VTE prophylaxis - SCDs May need 30-day cardiac event monitor at discharge    Diet   Diet regular Room service appropriate? Yes; Fluid consistency: Thin   No antithrombotic prior to admission, now on aspirin 81 mg daily and clopidogrel 75 mg daily for 21 days followed by aspirin monotherapy  Therapy recommendations: CIR Disposition:  pending  Hypertension Home meds: Norvasc 5 mg, lisinopril 20 mg Stable Cleviprex gtt off since 5/21  Resume home medications  Long-term BP goal  normotensive  Hyperlipidemia Home meds: None LDL 219, goal < 70 Patient reports statin intolerance and is refusing further statin therapy Discussed Leqvio injections with patient pending insurance authorization May need outpatient follow up with lipid clinic in the setting of hyperlipidemia and statin intolerance  Diabetes type II, Pre-Diabetic Home meds:  none HgbA1c 6.1, goal < 7.0 CBGs Recent Labs    04/21/23 2020 04/22/23 0317 04/23/23 0717  GLUCAP 113* 123* 118*    [facility] Recommend close PCP follow-up as A1c is within prediabetic range  Other Stroke Risk Factors Advanced Age >/= 43  Hx stroke/TIA Family hx stroke (stroke)  Other Active Problems    Patient is doing well but had some right-sided weakness and neglect while working with therapist today who recommended inpatient rehab.  Transfer to inpatient rehab when bed available..  Long discussion with patient and wife and family at the bedside and answered questions.     Delia Heady, MD Medical Director [facility] Pager: [phone] 04/24/2023 1:21 PM  To contact Stroke Continuity provider, please refer to [email or web address]. After hours, contact General Neurology

## 2023-04-24 NOTE — Progress Notes (Signed)
Occupational Therapy Treatment Patient Details Name: Ethan Payne MRN: [id number] DOB: 10-16-1946 Today's Date: 04/24/2023   History of present illness Patient presented as code stroke 04/21/23 due to acute onset of right-sided weakness.  Patient was doing yard work then right hand became weak patient laid down on his hammock for around 15 minutes and when he went to get up he could not hold his cell phone and fell.  TNK given.  MRI showed Multiple small foci of acute ischemia along the left precentral  and postcentral gyri.  PMH: DM, HTN, hyperlipidemia, right knee pain   OT comments  Patient demonstrating good gains with OT treatment with patient performing grooming tasks, including shaving with electric razor, with min guard assist, ambulating to toilet with HHA, and returning to sink. Patient instructed in [facility] exercises with red therapy putty for RUE. Patient demonstrated good understanding of exercises. Patient will benefit from intensive inpatient follow up therapy, >3 hours/day.   Recommendations for follow up therapy are one component of a multi-disciplinary discharge planning process, led by the attending physician.  Recommendations may be updated based on patient status, additional functional criteria and insurance authorization.    Assistance Recommended at Discharge Frequent or constant Supervision/Assistance  Patient can return home with the following  A little help with walking and/or transfers;A little help with bathing/dressing/bathroom;Assistance with cooking/housework;Assistance with feeding;Help with stairs or ramp for entrance;Assist for transportation   Equipment Recommendations  Tub/shower bench;Other (comment) (RW)    Recommendations for Other Services Rehab consult    Precautions / Restrictions Precautions Precautions: Fall Restrictions Weight Bearing Restrictions: No       Mobility Bed Mobility Overal bed mobility: Needs Assistance             General  bed mobility comments: in recliner upon arrival    Transfers Overall transfer level: Needs assistance Equipment used: None Transfers: Sit to/from Stand Sit to Stand: Min guard           General transfer comment: HHA to ambulate to bathroom and back to recliner     Balance Overall balance assessment: Needs assistance Sitting-balance support: Feet supported Sitting balance-Leahy Scale: Good     Standing balance support: Single extremity supported, During functional activity Standing balance-Leahy Scale: Poor Standing balance comment: stood at sink for grooming tasks with min guard assist                           ADL either performed or assessed with clinical judgement   ADL Overall ADL's : Needs assistance/impaired     Grooming: Wash/dry hands;Wash/dry face;Oral care;Min guard;Standing Grooming Details (indicate cue type and reason): at sink with occassional cue to locate items to right                 Toilet Transfer: Min guard;Ambulation Toilet Transfer Details (indicate cue type and reason): HHA to ambulate to bathroom           General ADL Comments: stood at sink for grooming, ambualted to bathroom from sink, returned to sink to wash hands, and transferred to recliner    Extremity/Trunk Assessment              Vision       Perception     Praxis      Cognition Arousal/Alertness: Awake/alert Behavior During Therapy: Flat affect Overall Cognitive Status: Impaired/Different from baseline Area of Impairment: Safety/judgement, Awareness, Problem solving  Safety/Judgement: Decreased awareness of safety, Decreased awareness of deficits Awareness: Emergent   General Comments: A&Ox4, cues to locate items to right        Exercises Exercises: Other exercises Other Exercises Other Exercises: [facility] exercises with red therapy putty    Shoulder Instructions       General Comments      Pertinent  Vitals/ Pain       Pain Assessment Pain Assessment: Faces Faces Pain Scale: No hurt Pain Intervention(s): Monitored during session  Home Living                                          Prior Functioning/Environment              Frequency  Min 2X/week        Progress Toward Goals  OT Goals(current goals can now be found in the care plan section)  Progress towards OT goals: Progressing toward goals  Acute Rehab OT Goals Patient Stated Goal: get stronger OT Goal Formulation: With patient Time For Goal Achievement: 05/07/23 Potential to Achieve Goals: Good ADL Goals Pt Will Perform Grooming: with modified independence;standing Pt Will Perform Upper Body Dressing: with modified independence;sitting Pt Will Perform Lower Body Dressing: with set-up;sit to/from stand Pt Will Transfer to Toilet: ambulating Additional ADL Goal #1: Pt will complete RUE FM HEP with min A for neuro re-education  Plan Discharge plan remains appropriate    Co-evaluation                 AM-PAC OT "6 Clicks" Daily Activity     Outcome Measure   Help from another person eating meals?: A Little Help from another person taking care of personal grooming?: A Little Help from another person toileting, which includes using toliet, bedpan, or urinal?: A Little Help from another person bathing (including washing, rinsing, drying)?: A Little Help from another person to put on and taking off regular upper body clothing?: A Little Help from another person to put on and taking off regular lower body clothing?: A Little 6 Click Score: 18    End of Session Equipment Utilized During Treatment: Gait belt  OT Visit Diagnosis: Unsteadiness on feet (R26.81);Other abnormalities of gait and mobility (R26.89);Muscle weakness (generalized) (M62.81);Hemiplegia and hemiparesis;Pain Hemiplegia - Right/Left: Right Hemiplegia - dominant/non-dominant: Dominant Hemiplegia - caused by: Cerebral  infarction   Activity Tolerance Patient tolerated treatment well   Patient Left in chair;with call bell/phone within reach;with chair alarm set;with family/visitor present   Nurse Communication Mobility status        Time: 4098-1191 OT Time Calculation (min): 27 min  Charges: OT General Charges $OT Visit: 1 Visit OT Treatments $Self Care/Home Management : 8-22 mins $Neuromuscular Re-education: 8-22 mins  Alfonse Flavors, OTA Acute Rehabilitation Services  Office [phone]   Dewain Penning 04/24/2023, 1:22 PM

## 2023-04-24 NOTE — H&P (Signed)
Physical Medicine and Rehabilitation Admission H&P   CC: Functional deficits secondary to acute ischemia along the left precentral and postcentral gyri  HPI: Ethan Payne is a 77 year old male who presented to the [facility] ED on 04/21/2023 with acute right-sided weakness. Code stroke initiated. Head CT showed no acute process. Evaluated by Dr. Selina Cooley and risks, benefits, and alternatives to TNK were discussed with patient who gave informed consent to proceed after further discussion with his adult son. CTA showed no LVO therefore no intervention indicated. TNK administered at 18:23 hours. Admitted to ICU. On 5/21, right weakness had improved, right facial droop present, some ataxia continued in right arm, weaker grip right hand, right hand bilateral sensation intact. Blood pressure adequately controlled. 2D Echo: EF 55 to 60%, mild concentric LVH, trivial MVR, trivial AVR, no evidence of left atrial mass, no shunt. LDL 219, HgbA1c 6.1%. VTE prophylaxis - SCDs. On 5/22, the patient has reported right upper extremity proximal weakness due to rotator cuff tear and right leg weakness due to a "bad knee". Right grip remains weaker, mild right facial droop persists. MRI brain with multiple small foci of acute ischemia along the left precentral and postcentral gyri. No hemorrhage or mass effect. Old bilateral cerebellar small vessel infarcts and findings of chronic follows all ischemia. Started on aspirin and Plavix for 21 days followed by aspirin alone. Patient reports statin intolerance and is refusing further statin therapy Discussed Leqvio injections with patient pending insurance authorization. May need outpatient follow up with lipid clinic in the setting of hyperlipidemia and statin intolerance per Dr. Pearlean Brownie. The patient has impairment mobility needing up to mod assist for ambulation due to right LE deficits. HE fatigues quickly, has poor safety awareness and appears to have some right inattention as well.  He is  tolerating regular diet. Norvasc and Zestril continue. The patient requires inpatient medicine and rehabilitation evaluations and services for ongoing dysfunction secondary to acute ischemia along the left precentral and postcentral gyri.  Pt reports tingling and sensory changes he had initially have basically resolved. LBM was today and voiding using urinal and BSC.   No pain- no HA's.  No visual changes  Review of Systems  Constitutional:  Positive for malaise/fatigue.  HENT: Negative.    Eyes: Negative.   Respiratory:  Negative for sputum production and shortness of breath.   Cardiovascular: Negative.   Gastrointestinal:  Negative for constipation, diarrhea and nausea.  Genitourinary: Negative.   Musculoskeletal: Negative.   Skin: Negative.   Neurological:  Positive for tingling and focal weakness. Negative for sensory change and seizures.  Endo/Heme/Allergies: Negative.   Psychiatric/Behavioral: Negative.    All other systems reviewed and are negative.  Past Medical History:  Diagnosis Date   Acute prostatitis 02/04/2008   ATRIAL MYXOMA 02/04/2008   Closed fracture of four ribs 11/01/2009   HYPERLIPIDEMIA 02/04/2008   HYPERTENSION 02/04/2008   Osteoarthritis of right knee 04/18/2010   Past Surgical History:  Procedure Laterality Date   CATARACT EXTRACTION Left 08/03/2019   CORONARY ARTERY BYPASS GRAFT  2007   x2 Saph vein   COX-MAZE MICROWAVE ABLATION  2007   EXCISION OF ATRIAL MYXOMA  2007   Dr C. Cornelius Moras   Family History  Problem Relation Age of Onset   Stroke Mother    Heart disease Father        MI 29   Hypertension Father    Ovarian cancer Sister    Social History:  reports that he has never smoked. He  has never used smokeless tobacco. He reports that he does not drink alcohol and does not use drugs. Allergies:  Allergies  Allergen Reactions   Hydrocodone Palpitations    After rib fractures. Hydrocodone-States had shortness of breath, 911 was called After rib  fractures. Hydrocodone-States had shortness of breath, 911 was called   Loratadine Other (See Comments) and Anxiety    Heart speeds up Other reaction(s): Other (See Comments) Heart speeds up   Medications Prior to Admission  Medication Sig Dispense Refill   amLODipine (NORVASC) 5 MG tablet Take 1 tablet (5 mg total) by mouth daily. 90 tablet 3   calcium carbonate (TUMS - DOSED IN MG ELEMENTAL CALCIUM) 500 MG chewable tablet Chew 2 tablets by mouth at bedtime.     lisinopril (ZESTRIL) 20 MG tablet Take 20 mg by mouth daily.     sildenafil (VIAGRA) 100 MG tablet Take 1 tablet (100 mg total) by mouth daily as needed for erectile dysfunction. 10 tablet 11      Home: Home Living Family/patient expects to be discharged to:: Private residence Living Arrangements: Spouse/significant other Available Help at Discharge: Family, Available 24 hours/day Type of Home: House Home Access: Stairs to enter [employer] of Steps: 4 Entrance Stairs-Rails: None Home Layout: Two level, Able to live on main level with bedroom/bathroom Bathroom Shower/Tub: Engineer, manufacturing systems: Standard Bathroom Accessibility: No Home Equipment: None (Pt has access to some DME from family)  Lives With: Spouse   Functional History: Prior Function Prior Level of Function : Independent/Modified Independent, Driving Mobility Comments: No issues with mobility PTA ADLs Comments: Independent with ADLs  Functional Status:  Mobility: Bed Mobility Overal bed mobility: Needs Assistance General bed mobility comments: sitting EOB on arrival Transfers Overall transfer level: Needs assistance Equipment used: None Transfers: Sit to/from Stand Sit to Stand: Min guard General transfer comment: Pt needed guard assist for safety as he has difficulty with coordination of right LE and right UE Ambulation/Gait Ambulation/Gait assistance: Mod assist, +2 physical assistance Gait Distance (Feet): 50  Feet Assistive device: 1 person hand held assist, 2 person hand held assist Gait Pattern/deviations: Step-to pattern, Decreased step length - right, Decreased stance time - right, Decreased weight shift to right, Ataxic, Leaning posteriorly, Staggering left, Staggering right, Wide base of support General Gait Details: Pt ambulated to door with +1 HHA with min assist initially with cues for safety as well as cues for sequencing steps as right LE with decr coordination at times. MD came and stopped pt in standing at door of room and talked to pt for up to 5 min.  When MD left, pt reported he didnt think he could make it back to chair as he has right knee problems and he felt that his leg was going to give out.  With +2 mod assist pt was able to ambulate back to chair with HHA bilaterally and cues and assist for inc coordination of right LE.  Pt and wife concur that this happened to pt at home if he stood long time frames.  However after discussionand assessment of other deficits, pt and wife admit that pt is having incr deficits. Gait velocity interpretation: <1.31 ft/sec, indicative of household ambulator    ADL: ADL Overall ADL's : Needs assistance/impaired Eating/Feeding: Set up, Sitting Eating/Feeding Details (indicate cue type and reason): must use non-dom LUE Grooming: Set up, Sitting Upper Body Bathing: Minimal assistance, Sitting Lower Body Bathing: Minimal assistance, Sit to/from stand Upper Body Dressing : Minimal assistance, Sitting Lower  Body Dressing: Sit to/from stand, Moderate assistance Toilet Transfer: Min guard, Ambulation Toilet Transfer Details (indicate cue type and reason): close guard and limited tolerance Toileting- Clothing Manipulation and Hygiene: Min guard, Sitting/lateral lean Functional mobility during ADLs: Min guard General ADL Comments: pt must use his non-dom LUE for all functional tasks. He has a very limited standing tolerance due to RLE incoordination and  chronic knee pain  Cognition: Cognition Overall Cognitive Status: Impaired/Different from baseline Orientation Level: Oriented X4 Cognition Arousal/Alertness: Awake/alert Behavior During Therapy: Flat affect Overall Cognitive Status: Impaired/Different from baseline Area of Impairment: Safety/judgement, Awareness, Problem solving Safety/Judgement: Decreased awareness of safety, Decreased awareness of deficits Awareness: Emergent Problem Solving: Difficulty sequencing, Requires verbal cues, Requires tactile cues, Decreased initiation General Comments: A&Ox4. anxious due to impairments. R inattention noted during functional tasks.  Poor safety awareness.  Physical Exam: Blood pressure (!) 142/78, pulse 88, temperature 98.2 F (36.8 C), temperature source Oral, resp. rate 18, height 5\' 9"  (1.753 m), weight 78.6 kg, SpO2 100 %. Physical Exam Vitals and nursing note reviewed. Exam conducted with a chaperone present.  Constitutional:      General: He is not in acute distress.    Appearance: Normal appearance. He is normal weight.     Comments: Pt appears stated age; sitting up in bed; awake, alert, wife at bedside, NAD  HENT:     Head: Normocephalic and atraumatic.     Comments: Mild R facial droop Mild R tongue deviation Sensation on face intact    Nose: Nose normal. No congestion.     Mouth/Throat:     Mouth: Mucous membranes are dry.     Pharynx: Oropharynx is clear. No oropharyngeal exudate.  Eyes:     General:        Right eye: No discharge.        Left eye: No discharge.     Extraocular Movements: Extraocular movements intact.  Cardiovascular:     Rate and Rhythm: Normal rate and regular rhythm.     Heart sounds: Normal heart sounds. No murmur heard.    No gallop.  Pulmonary:     Effort: Pulmonary effort is normal. No respiratory distress.     Breath sounds: Normal breath sounds. No wheezing, rhonchi or rales.  Abdominal:     General: Bowel sounds are normal. There is  no distension.     Palpations: Abdomen is soft.     Tenderness: There is no abdominal tenderness.  Musculoskeletal:     Cervical back: Neck supple. No tenderness.     Comments: RUE- 5-/5 proximally and 4+/5 grip and FA LUE 5/5 in same muscles RLE 5-/5 throughout and LLE 5/5  Skin:    General: Skin is warm and dry.     Comments: L forearm IV_ looks OK  Neurological:     Comments: Some word finding issues, pt feels it's baseline- wife says it's a little worse- was evident during basic conversation Intact to light touch in all  4 extremities Maybe mild R inattention  Psychiatric:        Mood and Affect: Mood normal.        Behavior: Behavior normal.     Results for orders placed or performed during the hospital encounter of 04/21/23 (from the past 48 hour(s))  Glucose, capillary     Status: Abnormal   Collection Time: 04/23/23  7:17 AM  Result Value Ref Range   Glucose-Capillary 118 (H) 70 - 99 mg/dL    Comment: Glucose reference range  applies only to samples taken after fasting for at least 8 hours.   MR BRAIN WO CONTRAST  Result Date: 04/22/2023 CLINICAL DATA:  Right-sided ataxia and weakness EXAM: MRI HEAD WITHOUT CONTRAST TECHNIQUE: Multiplanar, multiecho pulse sequences of the brain and surrounding structures were obtained without intravenous contrast. COMPARISON:  None Available. FINDINGS: Brain: There are multiple small foci of abnormal diffusion restriction along the left precentral and postcentral gyri. Minimal associated cytotoxic edema. No acute or chronic hemorrhage. There are old bilateral cerebellar small vessel infarcts. There is multifocal hyperintense T2-weighted signal within the periventricular and deep white matter. The midline structures are normal. Vascular: Major flow voids are preserved. Skull and upper cervical spine: Normal calvarium and skull base. Visualized upper cervical spine and soft tissues are normal. Sinuses/Orbits:Right maxillary sinus mucosal  thickening. Normal orbits. IMPRESSION: 1. Multiple small foci of acute ischemia along the left precentral and postcentral gyri. No hemorrhage or mass effect. 2. 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 Electronically Signed   By: Deatra Robinson M.D.   On: 04/22/2023 19:57   ECHOCARDIOGRAM COMPLETE  Result Date: 04/22/2023    ECHOCARDIOGRAM REPORT   Patient Name:   Ethan Payne Date of Exam: 04/22/2023 Medical Rec #:  [id number]       Height:       69.0 in Accession #:    [id number]      Weight:       173.3 lb Date of Birth:  May 18, 1946      BSA:          1.944 m Patient Age:    76 years        BP:           156/75 mmHg Patient Gender: M               HR:           85 bpm. Exam Location:  Inpatient Procedure: 2D Echo, Color Doppler and Cardiac Doppler Indications:    Stroke i63.9  History:        Patient has no prior history of Echocardiogram examinations.                 CAD, Arrythmias:Atrial Fibrillation; Risk Factors:Hypertension                 and Dyslipidemia. History of cardiac myxoma removed in 2009.  Sonographer:    Irving Burton Senior RDCS Referring Phys: Malachi Carl STACK IMPRESSIONS  1. Left ventricular ejection fraction, by estimation, is 55 to 60%. The left ventricle has normal function. The left ventricle has no regional wall motion abnormalities. There is mild concentric left ventricular hypertrophy of the septal segment. Left ventricular diastolic parameters were normal.  2. Right ventricular systolic function is normal. The right ventricular size is normal. Tricuspid regurgitation signal is inadequate for assessing PA pressure.  3. The mitral valve is grossly normal. Trivial mitral valve regurgitation. No evidence of mitral stenosis.  4. The aortic valve is tricuspid. Aortic valve regurgitation is trivial. Aortic valve sclerosis is present, with no evidence of aortic valve stenosis.  5. The inferior vena cava is normal in size with greater than 50%  respiratory variability, suggesting right atrial pressure of 3 mmHg. Comparison(s): No evidence of left atrial mass on transthoracic echo. In view of patient's history of myxoma resection, consider TEE if a cardioembolic cause of stroke is suspected. FINDINGS  Left Ventricle: Left ventricular ejection fraction, by estimation, is 55  to 60%. The left ventricle has normal function. The left ventricle has no regional wall motion abnormalities. The left ventricular internal cavity size was normal in size. There is  mild concentric left ventricular hypertrophy of the septal segment. Left ventricular diastolic parameters were normal. Right Ventricle: The right ventricular size is normal. No increase in right ventricular wall thickness. Right ventricular systolic function is normal. Tricuspid regurgitation signal is inadequate for assessing PA pressure. Left Atrium: Left atrial size was normal in size. Right Atrium: Right atrial size was normal in size. Pericardium: Trivial pericardial effusion is present. Presence of epicardial fat layer. Mitral Valve: The mitral valve is grossly normal. Trivial mitral valve regurgitation. No evidence of mitral valve stenosis. Tricuspid Valve: The tricuspid valve is normal in structure. Tricuspid valve regurgitation is trivial. No evidence of tricuspid stenosis. Aortic Valve: The aortic valve is tricuspid. Aortic valve regurgitation is trivial. Aortic valve sclerosis is present, with no evidence of aortic valve stenosis. Pulmonic Valve: The pulmonic valve was normal in structure. Pulmonic valve regurgitation is trivial. No evidence of pulmonic stenosis. Aorta: The aortic root is normal in size and structure. Venous: The inferior vena cava is normal in size with greater than 50% respiratory variability, suggesting right atrial pressure of 3 mmHg. IAS/Shunts: No atrial level shunt detected by color flow Doppler.  LEFT VENTRICLE PLAX 2D LVIDd:         4.10 cm   Diastology LVIDs:         2.90  cm   LV e' medial:    10.00 cm/s LV PW:         1.00 cm   LV E/e' medial:  9.2 LV IVS:        1.20 cm   LV e' lateral:   13.40 cm/s LVOT diam:     2.00 cm   LV E/e' lateral: 6.9 LV SV:         52 LV SV Index:   27 LVOT Area:     3.14 cm  RIGHT VENTRICLE RV S prime:     13.70 cm/s TAPSE (M-mode): 1.9 cm LEFT ATRIUM             Index        RIGHT ATRIUM           Index LA diam:        3.10 cm 1.59 cm/m   RA Area:     19.10 cm LA Vol (A2C):   45.8 ml 23.56 ml/m  RA Volume:   55.50 ml  28.55 ml/m LA Vol (A4C):   38.3 ml 19.70 ml/m LA Biplane Vol: 42.0 ml 21.61 ml/m  AORTIC VALVE LVOT Vmax:   79.00 cm/s LVOT Vmean:  59.500 cm/s LVOT VTI:    0.167 m  AORTA Ao Root diam: 3.60 cm Ao Asc diam:  3.70 cm MITRAL VALVE MV Area (PHT): 3.06 cm    SHUNTS MV Decel Time: 248 msec    Systemic VTI:  0.17 m MV E velocity: 91.90 cm/s  Systemic Diam: 2.00 cm MV A velocity: 52.60 cm/s MV E/A ratio:  1.75 Mihai Croitoru MD Electronically signed by Thurmon Fair MD Signature Date/Time: 04/22/2023/1:29:17 PM    Final       Blood pressure (!) 142/78, pulse 88, temperature 98.2 F (36.8 C), temperature source Oral, resp. rate 18, height 5\' 9"  (1.753 m), weight 78.6 kg, SpO2 100 %.  Medical Problem List and Plan: 1. Functional deficits secondary to multiple L brain stroke/infarcts- precentral and  post central gyri  -patient may  shower  -ELOS/Goals: 7-10 days mod I to supervision  2.  Antithrombotics: -DVT/anticoagulation:  Pharmaceutical: Lovenox>>start 40 mg daily  -antiplatelet therapy: Aspirin and Plavix for three weeks followed by aspirin alone (started 5/22)  3. Pain Management: Tylenol as needed  4. Mood/Behavior/Sleep: LCSW to evaluate and provide emotional support  -antipsychotic agents: n/a  5. Neuropsych/cognition: This patient is capable of making decisions on his own behalf.  6. Skin/Wound Care: Routine skin care checks   7. Fluids/Electrolytes/Nutrition: Routine Is and Os and follow-up  chemistries  8: Hypertension: monitor TID and prn (home Zestoretic 20/12.5 daily)  -continue amlodipine 5 mg daily  -continue lisinopril 20 mg daily  9: Hyperlipidemia: "Discussed Leqvio injection with patient and family pending insurance authorization. Form faxed to Leqvio. May need outpatient follow up with lipid clinic in the setting of hyperlipidemia and statin intolerance." >> per Dr. Pearlean Brownie  -Crestor is on current medication admin list>>continue (d/c summary not yet completed)   10: Prediabetes: carb modified diet; plasma/CBGs ~120s  -CBGs BID and monitor (no meds at home or this admission)   -follow-up PCP  11: pAF: >10 yrs ago which resolved after resection of cardiac myxoma in 2009.   -consider 30 day event monitor per neurology  12: GERD: s/p esophageal dil in 2023: continue Protonix  13: OA right knee- getting home R knee brace     Milinda Antis, PA-C 04/24/2023   I have personally performed a face to face diagnostic evaluation of this patient and formulated the key components of the plan.  Additionally, I have personally reviewed laboratory data, imaging studies, as well as relevant notes and concur with the physician assistant's documentation above.   The patient's status has not changed from the original H&P.  Any changes in documentation from the acute care chart have been noted above.

## 2023-04-24 NOTE — Progress Notes (Signed)
[facility]patient [facility] Individual Statement of Services  Patient Name:  Ethan Payne  Date:  04/24/2023  Welcome to the Inpatient Rehabilitation Center.  Our goal is to provide you with an individualized program based on your diagnosis and situation, designed to meet your specific needs.  With this comprehensive rehabilitation program, you will be expected to participate in at least 3 hours of rehabilitation therapies Monday-Friday, with modified therapy programming on the weekends.  Your rehabilitation program will include the following services:  Physical Therapy (PT), Occupational Therapy (OT), Speech Therapy (ST), 24 hour per day rehabilitation nursing, Therapeutic Recreaction (TR), Neuropsychology, Care Coordinator, Rehabilitation Medicine, Nutrition Services, Pharmacy Services, and Other  Weekly team conferences will be held on Wednesdays to discuss your progress.  Your Inpatient Rehabilitation Care Coordinator will talk with you frequently to get your input and to update you on team discussions.  Team conferences with you and your family in attendance may also be held.  Expected length of stay: 7-10 Days  Overall anticipated outcome: Mod I to supervision   Depending on your progress and recovery, your program may change. Your Inpatient Rehabilitation Care Coordinator will coordinate services and will keep you informed of any changes. Your Inpatient Rehabilitation Care Coordinator's name and contact numbers are listed  below.  The following services may also be recommended but are not provided by the Inpatient Rehabilitation Center:   Home Health Rehabiltiation Services Outpatient Rehabilitation Services  Arrangements will be made to provide these services after discharge if needed.  Arrangements include referral to agencies that provide these services.  Your insurance has been verified to be:   [employer] Your primary doctor is:  Tana Conch, MD  Pertinent  information will be shared with your doctor and your insurance company.  Inpatient Rehabilitation Care Coordinator:  Lavera Guise, [place] [phone] or [phone]  Information discussed with and copy given to patient by: Andria Rhein, 04/24/2023, 3:21 PM

## 2023-04-24 NOTE — H&P (Signed)
Physical Medicine and Rehabilitation Admission H&P     CC: Functional deficits secondary to acute ischemia along the left precentral and postcentral gyri   HPI: Ethan Payne is a 77 year old male who presented to the [facility] ED on 04/21/2023 with acute right-sided weakness. Code stroke initiated. Head CT showed no acute process. Evaluated by Dr. Selina Cooley and risks, benefits, and alternatives to TNK were discussed with patient who gave informed consent to proceed after further discussion with his adult son. CTA showed no LVO therefore no intervention indicated. TNK administered at 18:23 hours. Admitted to ICU. On 5/21, right weakness had improved, right facial droop present, some ataxia continued in right arm, weaker grip right hand, right hand bilateral sensation intact. Blood pressure adequately controlled. 2D Echo: EF 55 to 60%, mild concentric LVH, trivial MVR, trivial AVR, no evidence of left atrial mass, no shunt. LDL 219, HgbA1c 6.1%. VTE prophylaxis - SCDs. On 5/22, the patient has reported right upper extremity proximal weakness due to rotator cuff tear and right leg weakness due to a "bad knee". Right grip remains weaker, mild right facial droop persists. MRI brain with multiple small foci of acute ischemia along the left precentral and postcentral gyri. No hemorrhage or mass effect. Old bilateral cerebellar small vessel infarcts and findings of chronic follows all ischemia. Started on aspirin and Plavix for 21 days followed by aspirin alone. Patient reports statin intolerance and is refusing further statin therapy Discussed Leqvio injections with patient pending insurance authorization. May need outpatient follow up with lipid clinic in the setting of hyperlipidemia and statin intolerance per Dr. Pearlean Brownie. The patient has impairment mobility needing up to mod assist for ambulation due to right LE deficits. HE fatigues quickly, has poor safety awareness and appears to have some right inattention as well.  He  is tolerating regular diet. Norvasc and Zestril continue. The patient requires inpatient medicine and rehabilitation evaluations and services for ongoing dysfunction secondary to acute ischemia along the left precentral and postcentral gyri.   Pt reports tingling and sensory changes he had initially have basically resolved. LBM was today and voiding using urinal and BSC.   No pain- no HA's.  No visual changes   Review of Systems  Constitutional:  Positive for malaise/fatigue.  HENT: Negative.    Eyes: Negative.   Respiratory:  Negative for sputum production and shortness of breath.   Cardiovascular: Negative.   Gastrointestinal:  Negative for constipation, diarrhea and nausea.  Genitourinary: Negative.   Musculoskeletal: Negative.   Skin: Negative.   Neurological:  Positive for tingling and focal weakness. Negative for sensory change and seizures.  Endo/Heme/Allergies: Negative.   Psychiatric/Behavioral: Negative.    All other systems reviewed and are negative.       Past Medical History:  Diagnosis Date   Acute prostatitis 02/04/2008   ATRIAL MYXOMA 02/04/2008   Closed fracture of four ribs 11/01/2009   HYPERLIPIDEMIA 02/04/2008   HYPERTENSION 02/04/2008   Osteoarthritis of right knee 04/18/2010         Past Surgical History:  Procedure Laterality Date   CATARACT EXTRACTION Left 08/03/2019   CORONARY ARTERY BYPASS GRAFT   2007    x2 Saph vein   COX-MAZE MICROWAVE ABLATION   2007   EXCISION OF ATRIAL MYXOMA   2007    Dr C. Cornelius Moras         Family History  Problem Relation Age of Onset   Stroke Mother     Heart disease Father  MI 24   Hypertension Father     Ovarian cancer Sister      Social History:  reports that he has never smoked. He has never used smokeless tobacco. He reports that he does not drink alcohol and does not use drugs. Allergies:       Allergies  Allergen Reactions   Hydrocodone Palpitations      After rib fractures. Hydrocodone-States had shortness  of breath, 911 was called After rib fractures. Hydrocodone-States had shortness of breath, 911 was called   Loratadine Other (See Comments) and Anxiety      Heart speeds up Other reaction(s): Other (See Comments) Heart speeds up          Medications Prior to Admission  Medication Sig Dispense Refill   amLODipine (NORVASC) 5 MG tablet Take 1 tablet (5 mg total) by mouth daily. 90 tablet 3   calcium carbonate (TUMS - DOSED IN MG ELEMENTAL CALCIUM) 500 MG chewable tablet Chew 2 tablets by mouth at bedtime.       lisinopril (ZESTRIL) 20 MG tablet Take 20 mg by mouth daily.       sildenafil (VIAGRA) 100 MG tablet Take 1 tablet (100 mg total) by mouth daily as needed for erectile dysfunction. 10 tablet 11          Home: Home Living Family/patient expects to be discharged to:: Private residence Living Arrangements: Spouse/significant other Available Help at Discharge: Family, Available 24 hours/day Type of Home: House Home Access: Stairs to enter [employer] of Steps: 4 Entrance Stairs-Rails: None Home Layout: Two level, Able to live on main level with bedroom/bathroom Bathroom Shower/Tub: Engineer, manufacturing systems: Standard Bathroom Accessibility: No Home Equipment: None (Pt has access to some DME from family)  Lives With: Spouse   Functional History: Prior Function Prior Level of Function : Independent/Modified Independent, Driving Mobility Comments: No issues with mobility PTA ADLs Comments: Independent with ADLs   Functional Status:  Mobility: Bed Mobility Overal bed mobility: Needs Assistance General bed mobility comments: sitting EOB on arrival Transfers Overall transfer level: Needs assistance Equipment used: None Transfers: Sit to/from Stand Sit to Stand: Min guard General transfer comment: Pt needed guard assist for safety as he has difficulty with coordination of right LE and right UE Ambulation/Gait Ambulation/Gait assistance: Mod assist, +2  physical assistance Gait Distance (Feet): 50 Feet Assistive device: 1 person hand held assist, 2 person hand held assist Gait Pattern/deviations: Step-to pattern, Decreased step length - right, Decreased stance time - right, Decreased weight shift to right, Ataxic, Leaning posteriorly, Staggering left, Staggering right, Wide base of support General Gait Details: Pt ambulated to door with +1 HHA with min assist initially with cues for safety as well as cues for sequencing steps as right LE with decr coordination at times. MD came and stopped pt in standing at door of room and talked to pt for up to 5 min.  When MD left, pt reported he didnt think he could make it back to chair as he has right knee problems and he felt that his leg was going to give out.  With +2 mod assist pt was able to ambulate back to chair with HHA bilaterally and cues and assist for inc coordination of right LE.  Pt and wife concur that this happened to pt at home if he stood long time frames.  However after discussionand assessment of other deficits, pt and wife admit that pt is having incr deficits. Gait velocity interpretation: <1.31 ft/sec, indicative of  household ambulator   ADL: ADL Overall ADL's : Needs assistance/impaired Eating/Feeding: Set up, Sitting Eating/Feeding Details (indicate cue type and reason): must use non-dom LUE Grooming: Set up, Sitting Upper Body Bathing: Minimal assistance, Sitting Lower Body Bathing: Minimal assistance, Sit to/from stand Upper Body Dressing : Minimal assistance, Sitting Lower Body Dressing: Sit to/from stand, Moderate assistance Toilet Transfer: Min guard, Ambulation Toilet Transfer Details (indicate cue type and reason): close guard and limited tolerance Toileting- Clothing Manipulation and Hygiene: Min guard, Sitting/lateral lean Functional mobility during ADLs: Min guard General ADL Comments: pt must use his non-dom LUE for all functional tasks. He has a very limited standing  tolerance due to RLE incoordination and chronic knee pain   Cognition: Cognition Overall Cognitive Status: Impaired/Different from baseline Orientation Level: Oriented X4 Cognition Arousal/Alertness: Awake/alert Behavior During Therapy: Flat affect Overall Cognitive Status: Impaired/Different from baseline Area of Impairment: Safety/judgement, Awareness, Problem solving Safety/Judgement: Decreased awareness of safety, Decreased awareness of deficits Awareness: Emergent Problem Solving: Difficulty sequencing, Requires verbal cues, Requires tactile cues, Decreased initiation General Comments: A&Ox4. anxious due to impairments. R inattention noted during functional tasks.  Poor safety awareness.   Physical Exam: Blood pressure (!) 142/78, pulse 88, temperature 98.2 F (36.8 C), temperature source Oral, resp. rate 18, height 5\' 9"  (1.753 m), weight 78.6 kg, SpO2 100 %. Physical Exam Vitals and nursing note reviewed. Exam conducted with a chaperone present.  Constitutional:      General: He is not in acute distress.    Appearance: Normal appearance. He is normal weight.     Comments: Pt appears stated age; sitting up in bed; awake, alert, wife at bedside, NAD  HENT:     Head: Normocephalic and atraumatic.     Comments: Mild R facial droop Mild R tongue deviation Sensation on face intact    Nose: Nose normal. No congestion.     Mouth/Throat:     Mouth: Mucous membranes are dry.     Pharynx: Oropharynx is clear. No oropharyngeal exudate.  Eyes:     General:        Right eye: No discharge.        Left eye: No discharge.     Extraocular Movements: Extraocular movements intact.  Cardiovascular:     Rate and Rhythm: Normal rate and regular rhythm.     Heart sounds: Normal heart sounds. No murmur heard.    No gallop.  Pulmonary:     Effort: Pulmonary effort is normal. No respiratory distress.     Breath sounds: Normal breath sounds. No wheezing, rhonchi or rales.  Abdominal:      General: Bowel sounds are normal. There is no distension.     Palpations: Abdomen is soft.     Tenderness: There is no abdominal tenderness.  Musculoskeletal:     Cervical back: Neck supple. No tenderness.     Comments: RUE- 5-/5 proximally and 4+/5 grip and FA LUE 5/5 in same muscles RLE 5-/5 throughout and LLE 5/5  Skin:    General: Skin is warm and dry.     Comments: L forearm IV_ looks OK  Neurological:     Comments: Some word finding issues, pt feels it's baseline- wife says it's a little worse- was evident during basic conversation Intact to light touch in all  4 extremities Maybe mild R inattention  Psychiatric:        Mood and Affect: Mood normal.        Behavior: Behavior normal.  Lab Results Last 48 Hours        Results for orders placed or performed during the hospital encounter of 04/21/23 (from the past 48 hour(s))  Glucose, capillary     Status: Abnormal    Collection Time: 04/23/23  7:17 AM  Result Value Ref Range    Glucose-Capillary 118 (H) 70 - 99 mg/dL      Comment: Glucose reference range applies only to samples taken after fasting for at least 8 hours.       Imaging Results (Last 48 hours)  MR BRAIN WO CONTRAST   Result Date: 04/22/2023 CLINICAL DATA:  Right-sided ataxia and weakness EXAM: MRI HEAD WITHOUT CONTRAST TECHNIQUE: Multiplanar, multiecho pulse sequences of the brain and surrounding structures were obtained without intravenous contrast. COMPARISON:  None Available. FINDINGS: Brain: There are multiple small foci of abnormal diffusion restriction along the left precentral and postcentral gyri. Minimal associated cytotoxic edema. No acute or chronic hemorrhage. There are old bilateral cerebellar small vessel infarcts. There is multifocal hyperintense T2-weighted signal within the periventricular and deep white matter. The midline structures are normal. Vascular: Major flow voids are preserved. Skull and upper cervical spine: Normal calvarium and  skull base. Visualized upper cervical spine and soft tissues are normal. Sinuses/Orbits:Right maxillary sinus mucosal thickening. Normal orbits. IMPRESSION: 1. Multiple small foci of acute ischemia along the left precentral and postcentral gyri. No hemorrhage or mass effect. 2. 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 Electronically Signed   By: Deatra Robinson M.D.   On: 04/22/2023 19:57    ECHOCARDIOGRAM COMPLETE   Result Date: 04/22/2023    ECHOCARDIOGRAM REPORT   Patient Name:   Ethan Payne Date of Exam: 04/22/2023 Medical Rec #:  [id number]       Height:       69.0 in Accession #:    [id number]      Weight:       173.3 lb Date of Birth:  1946-05-15      BSA:          1.944 m Patient Age:    76 years        BP:           156/75 mmHg Patient Gender: M               HR:           85 bpm. Exam Location:  Inpatient Procedure: 2D Echo, Color Doppler and Cardiac Doppler Indications:    Stroke i63.9  History:        Patient has no prior history of Echocardiogram examinations.                 CAD, Arrythmias:Atrial Fibrillation; Risk Factors:Hypertension                 and Dyslipidemia. History of cardiac myxoma removed in 2009.  Sonographer:    Irving Burton Senior RDCS Referring Phys: Malachi Carl STACK IMPRESSIONS  1. Left ventricular ejection fraction, by estimation, is 55 to 60%. The left ventricle has normal function. The left ventricle has no regional wall motion abnormalities. There is mild concentric left ventricular hypertrophy of the septal segment. Left ventricular diastolic parameters were normal.  2. Right ventricular systolic function is normal. The right ventricular size is normal. Tricuspid regurgitation signal is inadequate for assessing PA pressure.  3. The mitral valve is grossly normal. Trivial mitral valve regurgitation. No evidence of mitral stenosis.  4. The aortic  valve is tricuspid. Aortic valve regurgitation is trivial. Aortic valve sclerosis is  present, with no evidence of aortic valve stenosis.  5. The inferior vena cava is normal in size with greater than 50% respiratory variability, suggesting right atrial pressure of 3 mmHg. Comparison(s): No evidence of left atrial mass on transthoracic echo. In view of patient's history of myxoma resection, consider TEE if a cardioembolic cause of stroke is suspected. FINDINGS  Left Ventricle: Left ventricular ejection fraction, by estimation, is 55 to 60%. The left ventricle has normal function. The left ventricle has no regional wall motion abnormalities. The left ventricular internal cavity size was normal in size. There is  mild concentric left ventricular hypertrophy of the septal segment. Left ventricular diastolic parameters were normal. Right Ventricle: The right ventricular size is normal. No increase in right ventricular wall thickness. Right ventricular systolic function is normal. Tricuspid regurgitation signal is inadequate for assessing PA pressure. Left Atrium: Left atrial size was normal in size. Right Atrium: Right atrial size was normal in size. Pericardium: Trivial pericardial effusion is present. Presence of epicardial fat layer. Mitral Valve: The mitral valve is grossly normal. Trivial mitral valve regurgitation. No evidence of mitral valve stenosis. Tricuspid Valve: The tricuspid valve is normal in structure. Tricuspid valve regurgitation is trivial. No evidence of tricuspid stenosis. Aortic Valve: The aortic valve is tricuspid. Aortic valve regurgitation is trivial. Aortic valve sclerosis is present, with no evidence of aortic valve stenosis. Pulmonic Valve: The pulmonic valve was normal in structure. Pulmonic valve regurgitation is trivial. No evidence of pulmonic stenosis. Aorta: The aortic root is normal in size and structure. Venous: The inferior vena cava is normal in size with greater than 50% respiratory variability, suggesting right atrial pressure of 3 mmHg. IAS/Shunts: No atrial level  shunt detected by color flow Doppler.  LEFT VENTRICLE PLAX 2D LVIDd:         4.10 cm   Diastology LVIDs:         2.90 cm   LV e' medial:    10.00 cm/s LV PW:         1.00 cm   LV E/e' medial:  9.2 LV IVS:        1.20 cm   LV e' lateral:   13.40 cm/s LVOT diam:     2.00 cm   LV E/e' lateral: 6.9 LV SV:         52 LV SV Index:   27 LVOT Area:     3.14 cm  RIGHT VENTRICLE RV S prime:     13.70 cm/s TAPSE (M-mode): 1.9 cm LEFT ATRIUM             Index        RIGHT ATRIUM           Index LA diam:        3.10 cm 1.59 cm/m   RA Area:     19.10 cm LA Vol (A2C):   45.8 ml 23.56 ml/m  RA Volume:   55.50 ml  28.55 ml/m LA Vol (A4C):   38.3 ml 19.70 ml/m LA Biplane Vol: 42.0 ml 21.61 ml/m  AORTIC VALVE LVOT Vmax:   79.00 cm/s LVOT Vmean:  59.500 cm/s LVOT VTI:    0.167 m  AORTA Ao Root diam: 3.60 cm Ao Asc diam:  3.70 cm MITRAL VALVE MV Area (PHT): 3.06 cm    SHUNTS MV Decel Time: 248 msec    Systemic VTI:  0.17 m MV E velocity: 91.90  cm/s  Systemic Diam: 2.00 cm MV A velocity: 52.60 cm/s MV E/A ratio:  1.75 Mihai Croitoru MD Electronically signed by Thurmon Fair MD Signature Date/Time: 04/22/2023/1:29:17 PM    Final            Blood pressure (!) 142/78, pulse 88, temperature 98.2 F (36.8 C), temperature source Oral, resp. rate 18, height 5\' 9"  (1.753 m), weight 78.6 kg, SpO2 100 %.   Medical Problem List and Plan: 1. Functional deficits secondary to multiple L brain stroke/infarcts- precentral and post central gyri             -patient may  shower             -ELOS/Goals: 7-10 days mod I to supervision   2.  Antithrombotics: -DVT/anticoagulation:  Pharmaceutical: Lovenox>>start 40 mg daily             -antiplatelet therapy: Aspirin and Plavix for three weeks followed by aspirin alone (started 5/22)   3. Pain Management: Tylenol as needed   4. Mood/Behavior/Sleep: LCSW to evaluate and provide emotional support             -antipsychotic agents: n/a   5. Neuropsych/cognition: This patient is  capable of making decisions on his own behalf.   6. Skin/Wound Care: Routine skin care checks   7. Fluids/Electrolytes/Nutrition: Routine Is and Os and follow-up chemistries   8: Hypertension: monitor TID and prn (home Zestoretic 20/12.5 daily)             -continue amlodipine 5 mg daily             -continue lisinopril 20 mg daily   9: Hyperlipidemia: "Discussed Leqvio injection with patient and family pending insurance authorization. Form faxed to Leqvio. May need outpatient follow up with lipid clinic in the setting of hyperlipidemia and statin intolerance." >> per Dr. 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              -consider 30 day event monitor per neurology   12: GERD: s/p esophageal dil in 2023: continue Protonix   13: OA right knee- getting home R knee brace         Milinda Antis, PA-C 04/24/2023     I have personally performed a face to face diagnostic evaluation of this patient and formulated the key components of the plan.  Additionally, I have personally reviewed laboratory data, imaging studies, as well as relevant notes and concur with the physician assistant's documentation above.   The patient's status has not changed from the original H&P.  Any changes in documentation from the acute care chart have been noted above.

## 2023-04-24 NOTE — Progress Notes (Signed)
Inpatient Rehabilitation Admission Medication Review by a Pharmacist  A complete drug regimen review was completed for this patient to identify any potential clinically significant medication issues.  High Risk Drug Classes Is patient taking? Indication by Medication  Antipsychotic No   Anticoagulant Yes Enoxaparin - VTE prophylaxis  Antibiotic No   Opioid No   Antiplatelet Yes Aspirin, clopidogrel - CVA prophylaxis  Hypoglycemics/insulin No   Vasoactive Medication Yes Amlodipine, lisinopril - blood pressure  Chemotherapy No   Other Yes Pantoprazole - GERD Rosuvastatin - hyperlipidemia  PRNs: Acetaminophen - mild pain Maalox - indigestion Guaifenesin/dextromethorphan - cough Methocarbamol - muscle spasms Ondansetron - nausea/vomiting Miralax, sorbitol - constipation Trazodone - sleep     Type of Medication Issue Identified Description of Issue Recommendation(s)  Drug Interaction(s) (clinically significant)     Duplicate Therapy     Allergy     No Medication Administration End Date     Incorrect Dose     Additional Drug Therapy Needed     Significant med changes from prior encounter (inform family/care partners about these prior to discharge). Aspirin 81 mg and Clopidogrel x 21 days, then Aspirin alone. Both begun 04/23/23.  Prior Lisinopril-HCTZ 20-12.5 mg.  Off HCTZ component.  Off Tums (two tablets) qhs Stop date is in place for Clopidogrel > last dose 05/13/23.   Monitor blood pressure; resume HCTZ if clinically warranted.  Resume during CIR admit or at discharge if warranted.  Other  Has been refusing rosuvastatin due to reported intolerance. Stroke Team notes discussed Leqvio injections pending insurance authorization.  Noted may need follow up with Lipid Clinic as outpatient.    Clinically significant medication issues were identified that warrant physician communication and completion of prescribed/recommended actions by midnight of the next day:   No  Pharmacist comments:  - anticipate continued refusal of rosuvastatin doses - per dispense records, rosuvastatin 40 mg Rx last filled 03/10/23 #90  Time spent performing this drug regimen review (minutes):  20   Dennie Fetters, [place] 04/24/2023 4:02 PM

## 2023-04-24 NOTE — Progress Notes (Signed)
Patient arrived and oriented to unit. Family members at bedside

## 2023-04-24 NOTE — Discharge Summary (Addendum)
Stroke Discharge Summary  Patient ID: Ethan Payne   MRN: [id number]      DOB: [birth year]  Date of Admission: 04/21/2023 Date of Discharge: 04/24/2023  Attending Physician:  Stroke, Md, MD, Stroke MD Consultant(s):   rehabilitation medicine Patient's PCP:  Shelva Majestic, MD  Discharge Diagnoses:  Principal Problem:   Acute ischemic stroke [facility]) left MCA branch infarct s/p IV TNK administration etiology likely embolism from cryptogenic source Hyperlipidemia Hypertension Medications to be continued on Rehab Allergies as of 04/24/2023       Reactions   Hydrocodone Palpitations   After rib fractures. Hydrocodone-States had shortness of breath, 911 was called After rib fractures. Hydrocodone-States had shortness of breath, 911 was called   Loratadine Other (See Comments), Anxiety   Heart speeds up Other reaction(s): Other (See Comments) Heart speeds up        Medication List     TAKE these medications    amLODipine 5 MG tablet Commonly known as: NORVASC Take 1 tablet (5 mg total) by mouth daily.   aspirin EC 81 MG tablet Take 1 tablet (81 mg total) by mouth daily. Swallow whole. Start taking on: Apr 25, 2023   calcium carbonate 500 MG chewable tablet Commonly known as: TUMS - dosed in mg elemental calcium Chew 2 tablets by mouth at bedtime.   clopidogrel 75 MG tablet Commonly known as: PLAVIX Take 1 tablet (75 mg total) by mouth daily. Start taking on: Apr 25, 2023   lisinopril 20 MG tablet Commonly known as: ZESTRIL Take 20 mg by mouth daily.   sildenafil 100 MG tablet Commonly known as: Viagra Take 1 tablet (100 mg total) by mouth daily as needed for erectile dysfunction.       LABORATORY STUDIES CBC    Component Value Date/Time   WBC 10.5 04/22/2023 0204   RBC 4.64 04/22/2023 0204   HGB 14.2 04/22/2023 0204   HCT 41.1 04/22/2023 0204   PLT 256 04/22/2023 0204   MCV 88.6 04/22/2023 0204   MCH 30.6 04/22/2023 0204   MCHC 34.5 04/22/2023  0204   RDW 12.7 04/22/2023 0204   LYMPHSABS 2.4 04/22/2023 0204   MONOABS 0.9 04/22/2023 0204   EOSABS 0.5 04/22/2023 0204   BASOSABS 0.1 04/22/2023 0204   CMP    Component Value Date/Time   NA 137 04/22/2023 0204   K 4.2 04/22/2023 0204   CL 102 04/22/2023 0204   CO2 27 04/22/2023 0204   GLUCOSE 121 (H) 04/22/2023 0204   BUN 11 04/22/2023 0204   CREATININE 1.14 04/22/2023 0204   CALCIUM 9.2 04/22/2023 0204   PROT 7.3 04/22/2023 0204   ALBUMIN 3.9 04/22/2023 0204   AST 23 04/22/2023 0204   ALT 24 04/22/2023 0204   ALKPHOS 45 04/22/2023 0204   BILITOT 1.2 04/22/2023 0204   GFRNONAA >60 04/22/2023 0204   GFRAA  10/19/2009 0315    >60        The eGFR has been calculated using the MDRD equation. This calculation has not been validated in all clinical situations. eGFR's persistently <60 mL/min signify possible Chronic Kidney Disease.   COAGS Lab Results  Component Value Date   INR 1.1 04/21/2023   Lipid Panel    Component Value Date/Time   CHOL 289 (H) 04/22/2023 0204   TRIG 100 04/22/2023 0204   HDL 50 04/22/2023 0204   CHOLHDL 5.8 04/22/2023 0204   VLDL 20 04/22/2023 0204   LDLCALC 219 (H) 04/22/2023 4696  HgbA1C  Lab Results  Component Value Date   HGBA1C 6.1 (H) 04/21/2023   Urinalysis    Component Value Date/Time   COLORURINE yellow 01/05/2010 1000   APPEARANCEUR Clear 01/05/2010 1000   LABSPEC 1.020 01/05/2010 1000   PHURINE 7.0 01/05/2010 1000   HGBUR trace-intact 01/05/2010 1000   BILIRUBINUR n 01/14/2011 0000   PROTEINUR n 01/14/2011 0000   UROBILINOGEN 0.2 01/14/2011 0000   UROBILINOGEN 0.2 01/05/2010 1000   NITRITE n 01/14/2011 0000   NITRITE negative 01/05/2010 1000   LEUKOCYTESUR n 01/14/2011 0000   Urine Drug Screen No results found for: "LABOPIA", "COCAINSCRNUR", "LABBENZ", "AMPHETMU", "THCU", "LABBARB"  Alcohol Level    Component Value Date/Time   ETH <10 04/21/2023 1800   SIGNIFICANT DIAGNOSTIC STUDIES MR BRAIN WO  CONTRAST  Result Date: 04/22/2023 CLINICAL DATA:  Right-sided ataxia and weakness EXAM: MRI HEAD WITHOUT CONTRAST TECHNIQUE: Multiplanar, multiecho pulse sequences of the brain and surrounding structures were obtained without intravenous contrast. COMPARISON:  None Available. FINDINGS: Brain: There are multiple small foci of abnormal diffusion restriction along the left precentral and postcentral gyri. Minimal associated cytotoxic edema. No acute or chronic hemorrhage. There are old bilateral cerebellar small vessel infarcts. There is multifocal hyperintense T2-weighted signal within the periventricular and deep white matter. The midline structures are normal. Vascular: Major flow voids are preserved. Skull and upper cervical spine: Normal calvarium and skull base. Visualized upper cervical spine and soft tissues are normal. Sinuses/Orbits:Right maxillary sinus mucosal thickening. Normal orbits. IMPRESSION: 1. Multiple small foci of acute ischemia along the left precentral and postcentral gyri. No hemorrhage or mass effect. 2. 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 Electronically Signed   By: Deatra Robinson M.D.   On: 04/22/2023 19:57   ECHOCARDIOGRAM COMPLETE  Result Date: 04/22/2023    ECHOCARDIOGRAM REPORT   Patient Name:   Ethan Payne Date of Exam: 04/22/2023 Medical Rec #:  [id number]       Height:       69.0 in Accession #:    [id number]      Weight:       173.3 lb Date of Birth:  Feb 22, 1946      BSA:          1.944 m Patient Age:    76 years        BP:           156/75 mmHg Patient Gender: M               HR:           85 bpm. Exam Location:  Inpatient Procedure: 2D Echo, Color Doppler and Cardiac Doppler Indications:    Stroke i63.9  History:        Patient has no prior history of Echocardiogram examinations.                 CAD, Arrythmias:Atrial Fibrillation; Risk Factors:Hypertension                 and Dyslipidemia. History of cardiac myxoma  removed in 2009.  Sonographer:    Irving Burton Senior RDCS Referring Phys: Malachi Carl STACK IMPRESSIONS  1. Left ventricular ejection fraction, by estimation, is 55 to 60%. The left ventricle has normal function. The left ventricle has no regional wall motion abnormalities. There is mild concentric left ventricular hypertrophy of the septal segment. Left ventricular diastolic parameters were normal.  2. Right ventricular systolic function is normal. The right ventricular  size is normal. Tricuspid regurgitation signal is inadequate for assessing PA pressure.  3. The mitral valve is grossly normal. Trivial mitral valve regurgitation. No evidence of mitral stenosis.  4. The aortic valve is tricuspid. Aortic valve regurgitation is trivial. Aortic valve sclerosis is present, with no evidence of aortic valve stenosis.  5. The inferior vena cava is normal in size with greater than 50% respiratory variability, suggesting right atrial pressure of 3 mmHg. Comparison(s): No evidence of left atrial mass on transthoracic echo. In view of patient's history of myxoma resection, consider TEE if a cardioembolic cause of stroke is suspected. FINDINGS  Left Ventricle: Left ventricular ejection fraction, by estimation, is 55 to 60%. The left ventricle has normal function. The left ventricle has no regional wall motion abnormalities. The left ventricular internal cavity size was normal in size. There is  mild concentric left ventricular hypertrophy of the septal segment. Left ventricular diastolic parameters were normal. Right Ventricle: The right ventricular size is normal. No increase in right ventricular wall thickness. Right ventricular systolic function is normal. Tricuspid regurgitation signal is inadequate for assessing PA pressure. Left Atrium: Left atrial size was normal in size. Right Atrium: Right atrial size was normal in size. Pericardium: Trivial pericardial effusion is present. Presence of epicardial fat layer. Mitral Valve: The  mitral valve is grossly normal. Trivial mitral valve regurgitation. No evidence of mitral valve stenosis. Tricuspid Valve: The tricuspid valve is normal in structure. Tricuspid valve regurgitation is trivial. No evidence of tricuspid stenosis. Aortic Valve: The aortic valve is tricuspid. Aortic valve regurgitation is trivial. Aortic valve sclerosis is present, with no evidence of aortic valve stenosis. Pulmonic Valve: The pulmonic valve was normal in structure. Pulmonic valve regurgitation is trivial. No evidence of pulmonic stenosis. Aorta: The aortic root is normal in size and structure. Venous: The inferior vena cava is normal in size with greater than 50% respiratory variability, suggesting right atrial pressure of 3 mmHg. IAS/Shunts: No atrial level shunt detected by color flow Doppler.  LEFT VENTRICLE PLAX 2D LVIDd:         4.10 cm   Diastology LVIDs:         2.90 cm   LV e' medial:    10.00 cm/s LV PW:         1.00 cm   LV E/e' medial:  9.2 LV IVS:        1.20 cm   LV e' lateral:   13.40 cm/s LVOT diam:     2.00 cm   LV E/e' lateral: 6.9 LV SV:         52 LV SV Index:   27 LVOT Area:     3.14 cm  RIGHT VENTRICLE RV S prime:     13.70 cm/s TAPSE (M-mode): 1.9 cm LEFT ATRIUM             Index        RIGHT ATRIUM           Index LA diam:        3.10 cm 1.59 cm/m   RA Area:     19.10 cm LA Vol (A2C):   45.8 ml 23.56 ml/m  RA Volume:   55.50 ml  28.55 ml/m LA Vol (A4C):   38.3 ml 19.70 ml/m LA Biplane Vol: 42.0 ml 21.61 ml/m  AORTIC VALVE LVOT Vmax:   79.00 cm/s LVOT Vmean:  59.500 cm/s LVOT VTI:    0.167 m  AORTA Ao Root diam: 3.60 cm Ao Asc  diam:  3.70 cm MITRAL VALVE MV Area (PHT): 3.06 cm    SHUNTS MV Decel Time: 248 msec    Systemic VTI:  0.17 m MV E velocity: 91.90 cm/s  Systemic Diam: 2.00 cm MV A velocity: 52.60 cm/s MV E/A ratio:  1.75 Mihai Croitoru MD Electronically signed by Thurmon Fair MD Signature Date/Time: 04/22/2023/1:29:17 PM    Final    CT ANGIO HEAD NECK W WO CM (CODE  STROKE)  Result Date: 04/21/2023 CLINICAL DATA:  Neuro deficit, acute, stroke suspected. Right-sided weakness. Right-sided ataxia > weakness. EXAM: CT ANGIOGRAPHY HEAD AND NECK WITH AND WITHOUT CONTRAST TECHNIQUE: Multidetector CT imaging of the head and neck was performed using the standard protocol during bolus administration of intravenous contrast. Multiplanar CT image reconstructions and MIPs were obtained to evaluate the vascular anatomy. Carotid stenosis measurements (when applicable) are obtained utilizing NASCET criteria, using the distal internal carotid diameter as the denominator. RADIATION DOSE REDUCTION: This exam was performed according to the departmental dose-optimization program which includes automated exposure control, adjustment of the mA and/or kV according to patient size and/or use of iterative reconstruction technique. CONTRAST:  75mL OMNIPAQUE IOHEXOL 350 MG/ML SOLN COMPARISON:  Head CT 04/21/2023. FINDINGS: CTA NECK FINDINGS Aortic arch: Four vessel arch configuration with separate origin of the left vertebral artery. Arch vessel origins are patent. Right carotid system: No evidence of dissection, stenosis (50% or greater), or occlusion. Left carotid system: No evidence of dissection, stenosis (50% or greater), or occlusion. Vertebral arteries: Codominant. No evidence of dissection, stenosis (50% or greater), or occlusion. Skeleton: Multilevel cervical spondylosis, worst at C5-6, where there is at least moderate spinal canal stenosis. Other neck: Unremarkable. Upper chest: Unremarkable. Review of the MIP images confirms the above findings CTA HEAD FINDINGS Anterior circulation: Intracranial ICAs are patent without stenosis or aneurysm. The proximal ACAs and MCAs are patent without stenosis or aneurysm. Distal branches are symmetric. Posterior circulation: Normal basilar artery. The SCAs, AICAs and PICAs are patent proximally. The PCAs are patent proximally without stenosis or aneurysm.  Distal branches are symmetric. Venous sinuses: Early phase of contrast. Anatomic variants: Persistent fetal origin of the right PCA with hypoplastic right P1 segment. Review of the MIP images confirms the above findings IMPRESSION: 1. No large vessel occlusion, hemodynamically significant stenosis, dissection, or aneurysm in the head or neck vessels. 2. Multilevel cervical spondylosis, worst at C5-6 where there is at least moderate spinal canal stenosis. Electronically Signed   By: Orvan Falconer M.D.   On: 04/21/2023 18:39   CT HEAD CODE STROKE WO CONTRAST  Result Date: 04/21/2023 CLINICAL DATA:  Code stroke. Neuro deficit, acute, stroke suspected. Right-sided weakness and ataxia. EXAM: CT HEAD WITHOUT CONTRAST TECHNIQUE: Contiguous axial images were obtained from the base of the skull through the vertex without intravenous contrast. RADIATION DOSE REDUCTION: This exam was performed according to the departmental dose-optimization program which includes automated exposure control, adjustment of the mA and/or kV according to patient size and/or use of iterative reconstruction technique. COMPARISON:  MRI brain 01/14/2006. FINDINGS: Brain: Within limits of streak artifact in the posterior fossa, no evidence of acute hemorrhage or loss of gray-white differentiation. Mild chronic small-vessel disease. No hydrocephalus or extra-axial collection. No mass effect or midline shift. Vascular: No hyperdense vessel or unexpected calcification. Skull: No calvarial fracture or suspicious bone lesion. Skull base is unremarkable. Sinuses/Orbits: Moderate mucosal disease in the right maxillary sinus. Orbits are unremarkable. Other: None. ASPECTS [facility] Stroke Program Early CT Score) - Ganglionic level infarction (caudate,  lentiform nuclei, internal capsule, insula, M1-M3 cortex): 7 - Supraganglionic infarction (M4-M6 cortex): 3 Total score (0-10 with 10 being normal): 10 IMPRESSION: No acute intracranial hemorrhage or evidence  of evolving large vessel territory infarct. ASPECT score is 10. Code stroke imaging results were communicated on 04/21/2023 at 6:15 pm to provider Dr. Selina Cooley via telephone, who verbally acknowledged these results. Electronically Signed   By: Orvan Falconer M.D.   On: 04/21/2023 18:23       HISTORY OF PRESENT ILLNESS Mr. MOE YONAMINE is a 77 y.o. male with history of hypertension, hyperlipidemia. Patient presented as code stroke 5/20 due to acute onset of right-sided weakness. Denied speech or vision issues.  CT showed no acute process.  Patient received TNK in the ED at 1823.  No intervention due to no LVO on CTA.    HOSPITAL COURSE Multiple small foci of acute ischemia along the left precentral and potential direct s/p TNK administration. Etiology:  Ischemia due to small vessel disease  Secondary risk factors include: Hypertension, hyperlipidemia, prediabetes Code Stroke CT head: No acute abnormality.  ASPECTS 10.    CTA head & neck: No LVO MRI: Multiple small foci of acute ischemia along the left precentral impression to try.  No hemorrhage or mass effect.  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 2D Echo: EF 55 to 60%, mild concentric LVH, trivial MVR, trivial AVR, no evidence of left atrial mass, no shunt LDL 219 HgbA1c 6.1 VTE prophylaxis - SCDs Will need 30-day cardiac event monitor at discharge       Diet    Diet regular Room service appropriate? Yes; Fluid consistency: Thin        No antithrombotic prior to admission, now on aspirin 81 mg daily and clopidogrel 75 mg daily for 21 days followed by aspirin monotherapy  Therapy recommendations: CIR Disposition:  Discharge to CIR   Hypertension Home meds: Norvasc 5 mg, lisinopril 20 mg Stable Cleviprex gtt off since 5/21  Resume home medications  Long-term BP goal normotensive   Hyperlipidemia Home meds: None LDL 219, goal < 70 Patient reports statin intolerance and is  refusing further statin therapy Discussed Leqvio injections with patient pending insurance authorization May need outpatient follow up with lipid clinic in the setting of hyperlipidemia and statin intolerance   Diabetes type II, Pre-Diabetic Home meds:  none HgbA1c 6.1, goal < 7.0 CBGs Recent Labs (last 2 labs)       Recent Labs    04/21/23 2020 04/22/23 0317 04/23/23 0717  GLUCAP 113* 123* 118*      SSI Recommend close PCP follow-up as A1c is within prediabetic range   Other Stroke Risk Factors Advanced Age >/= 59  Hx stroke/TIA Family hx stroke (stroke)   Other Active Problems    DISCHARGE EXAM Blood pressure (!) 146/84, pulse 91, temperature 98.2 F (36.8 C), temperature source Oral, resp. rate 18, height 5\' 9"  (1.753 m), weight 78.6 kg, SpO2 97 %.  PHYSICAL EXAM General: Sitting up in bedside chair, no acute distress HEENT: Atraumatic, normocephalic Respiratory: Unlabored breathing, room air CV: RRR, symmetric peripheral pulses Abdomen: Soft nontender   Neuro: MS: AOx4, follows commands. Speech: Fluid, no dysarthria or neglect present naming and repetition intact. CN: Visual fields full, EOMI without nystagmus, facial sensation intact, right-sided facial droop, hard of hearing at baseline, symmetrical head turn and shoulder shrug, tongue with midline protrusion Motor: RUE/RLE 4/5, 4/5 grip.   Sensation: Light touch intact. Coordination: Ataxia  to RUE with finger-to-nose.  Rapid alternating movements intact heel-to-shin intact bilaterally Gait: Deferred  Discharge Diet      Diet   Diet regular Room service appropriate? Yes; Fluid consistency: Thin   liquids  DISCHARGE PLAN Disposition:  Transfer to [facility] for ongoing PT, OT and ST aspirin 81 mg daily and clopidogrel 75 mg daily for secondary stroke prevention for 3 weeks then aspirin alone. Recommend ongoing stroke risk factor control by Primary Care Physician at time of discharge from  inpatient rehabilitation. Recommend outpatient 30-day cardiac monitor for paroxysmal A-fib Follow-up PCP Shelva Majestic, MD in 2 weeks following discharge from rehab. Follow-up in [facility] in 8 weeks following discharge from rehab, office to schedule an appointment.   35 minutes were spent preparing discharge.  Lanae Boast, AGACNP-BC [facility] Pager: [phone]  I have personally obtained history,examined this patient, reviewed notes, independently viewed imaging studies, participated in medical decision making and plan of care.ROS completed by me personally and pertinent positives fully documented  I have made any additions or clarifications directly to the above note. Agree with note above.    Delia Heady, MD Medical Director [facility] Pager: [phone] 04/24/2023 5:37 PM

## 2023-04-25 ENCOUNTER — Inpatient Hospital Stay (HOSPITAL_COMMUNITY): Payer: Medicare Other

## 2023-04-25 DIAGNOSIS — I639 Cerebral infarction, unspecified: Secondary | ICD-10-CM | POA: Diagnosis not present

## 2023-04-25 LAB — COMPREHENSIVE METABOLIC PANEL
ALT: 24 U/L (ref 0–44)
AST: 21 U/L (ref 15–41)
Albumin: 3.7 g/dL (ref 3.5–5.0)
Alkaline Phosphatase: 39 U/L (ref 38–126)
Anion gap: 8 (ref 5–15)
BUN: 17 mg/dL (ref 8–23)
CO2: 25 mmol/L (ref 22–32)
Calcium: 8.9 mg/dL (ref 8.9–10.3)
Chloride: 102 mmol/L (ref 98–111)
Creatinine, Ser: 1.27 mg/dL — ABNORMAL HIGH (ref 0.61–1.24)
GFR, Estimated: 59 mL/min — ABNORMAL LOW (ref >60)
Glucose, Bld: 113 mg/dL — ABNORMAL HIGH (ref 70–99)
Potassium: 3.8 mmol/L (ref 3.5–5.1)
Sodium: 135 mmol/L (ref 135–145)
Total Bilirubin: 0.9 mg/dL (ref 0.3–1.2)
Total Protein: 7.1 g/dL (ref 6.5–8.1)

## 2023-04-25 LAB — GLUCOSE, CAPILLARY: Glucose-Capillary: 136 mg/dL — ABNORMAL HIGH (ref 70–99)

## 2023-04-25 MED ORDER — SODIUM CHLORIDE 0.9 % IV BOLUS
500.0000 mL | Freq: Once | INTRAVENOUS | Status: AC
  Administered 2023-04-25: 500 mL via INTRAVENOUS

## 2023-04-25 MED ORDER — SODIUM CHLORIDE 0.9 % IV SOLN: INTRAVENOUS | Status: DC

## 2023-04-25 MED ORDER — MUSCLE RUB 10-15 % EX CREA: TOPICAL_CREAM | Freq: Two times a day (BID) | CUTANEOUS | Status: DC | PRN

## 2023-04-25 NOTE — Progress Notes (Signed)
Inpatient Rehabilitation Care Coordinator Discharge Note   Patient Details  Name: Ethan Payne MRN: [id number] Date of Birth: [birth year]   Discharge location: Home  Length of Stay: 2 days  Discharge activity level: MOD i- SUP  Home/community participation: spouse, sister and other family.  Patient response NW:GNFAOZ Literacy - How often do you need to have someone help you when you read instructions, pamphlets, or other written material from your doctor or pharmacy?: Rarely  Patient response HY:QMVHQI Isolation - How often do you feel lonely or isolated from those around you?: Never  Services provided included: MD, RD, PT, OT, SLP, CM, RN, TR, Pharmacy, SW  Financial Services:  Field seismologist Utilized: Barrister's clerk MEDICARE  Choices offered to/list presented to: Patient  Follow-up services arranged:  Outpatient, DME    Outpatient Servicies: OP at [facility] - PT DME : No reccs    Patient response to transportation need: Is the patient able to respond to transportation needs?: Yes In the past 12 months, has lack of transportation kept you from medical appointments or from getting medications?: No In the past 12 months, has lack of transportation kept you from meetings, work, or from getting things needed for daily living?: No   Patient/Family verbalized understanding of follow-up arrangements:  Yes  Individual responsible for coordination of the follow-up plan: self or spouse  Confirmed correct DME delivered: Andria Rhein 04/25/2023    Comments (or additional information):  Summary of Stay    Date/Time Discharge Planning CSW  04/25/23 1209 Patient admittied today, per therpay pt high level and ready for d/c tomorrow. CJB       Andria Rhein

## 2023-04-25 NOTE — Patient Care Conference (Incomplete)
Inpatient RehabilitationTeam Conference and Plan of Care Update Date: 04/28/2023   Time: 8:27 AM    Patient Name: Ethan Payne      Medical Record Number: [id number]  Date of Birth: 05/04/1946 Sex: Male         Room/Bed: 4M09C/4M09C-01 Payor Info: Payor: [employer]UE [employer]UE [employer] MEDICARE / Plan: BC[employer] MEDICARE / Product Type: *No Product type* /    Admit Date/Time:  04/24/2023  3:05 PM  Primary Diagnosis:  Acute embolic stroke [facility])  Hospital Problems: Principal Problem:   Acute embolic stroke [facility])    Expected Discharge Date: Expected Discharge Date: 04/26/23  Team Members Present: Physician leading conference: Dr. Claudette Laws Social Worker Present: Lavera Guise, BSW Nurse Present: Chana Bode, RN PT Present: Ralph Leyden, PT OT Present: Primitivo Gauze, OT     Current Status/Progress Goal Weekly Team Focus  Bowel/Bladder      Continent of bowel and bladder          Swallow/Nutrition/ Hydration               ADL's   independent with self care, pt ready for discharge   no goals   pt education completed    Mobility   IND with all functional mobility with no need for AD. Chronic R knee OA pain and recommended OPPT.   no goals  All education completed. Recommendations for OPPT/ ortho.    Communication                Safety/Cognition/ Behavioral Observations               Pain      Right knee chronic; uses orthosis and aspercreme PTA    Knee pain managed   Had injection years ago, for knee pain, using muscle rub   Skin      N/a           Discharge Planning:  Patient admittied today, per therpay pt high level and ready for d/c tomorrow.   Team Discussion: Patient doing well overall  Patient on target to meet rehab goals: yes  *See Care Plan and progress notes for long and short-term goals.   Revisions to Treatment Plan:  N/A Teaching Needs: Safety, medications, dietary modifications, etc.   Current Barriers to  Discharge: Home enviroment access/layout  Possible Resolutions to Barriers: Family education OP PT follow up services No DME recommendations     Medical Summary Current Status: medically stable     Possible Resolutions to [employer] Focus: D/C home in am   Continued Need for Acute Rehabilitation Level of Care: The patient requires daily medical management by a physician with specialized training in physical medicine and rehabilitation for the following reasons: Direction of a multidisciplinary physical rehabilitation program to maximize functional independence : Yes Medical management of patient stability for increased activity during participation in an intensive rehabilitation regime.: Yes Analysis of laboratory values and/or radiology reports with any subsequent need for medication adjustment and/or medical intervention. : Yes   I attest that I was present, lead the team conference, and concur with the assessment and plan of the team.   Chana Bode B 04/28/2023, 8:27 AM

## 2023-04-25 NOTE — Progress Notes (Signed)
PROGRESS NOTE   Subjective/Complaints:  Chronic RIght shoulder pain , was to have ortho consult but appt not made, no nocturnal pain , has normal movement  RIght knee pain , chronic , had injection years ago which was helpful , uses orthosis at home and aspercreme ROS- neg CP, SOB, N/V/D Objective:   No results found. No results for input(s): "WBC", "HGB", "HCT", "PLT" in the last 72 hours. Recent Labs    04/25/23 0539  NA 135  K 3.8  CL 102  CO2 25  GLUCOSE 113*  BUN 17  CREATININE 1.27*  CALCIUM 8.9    Intake/Output Summary (Last 24 hours) at 04/25/2023 0755 Last data filed at 04/25/2023 0751 Gross per 24 hour  Intake 120 ml  Output --  Net 120 ml        Physical Exam: Vital Signs Blood pressure (!) 147/83, pulse 81, temperature 98.6 F (37 C), temperature source Oral, resp. rate 18, weight 75 kg, SpO2 99 %.  HOH , ? Mild anomia  General: No acute distress Mood and affect are appropriate Heart: Regular rate and rhythm no rubs murmurs or extra sounds Lungs: Clear to auscultation, breathing unlabored, no rales or wheezes Abdomen: Positive bowel sounds, soft nontender to palpation, nondistended Extremities: No clubbing, cyanosis, or edema Skin: No evidence of breakdown, no evidence of rash Neurologic: Cranial nerves II through XII intact, motor strength is 5/5 in bilateral deltoid, bicep, tricep, 4/5 R and 5/5 Left grip, 5/5 B hip flexor, knee extensors, ankle dorsiflexor and plantar flexor Sensory exam normal sensation to light touch a in bilateral upper and lower extremities Finger to thumb opposition intact, neg dysdiadochokinesis RAM BUE  Musculoskeletal: Full range of motion in all 4 extremities. No joint swelling Neg impingement RIght shoulder full ROM without pain    Assessment/Plan: 1. Functional deficits which require 3+ hours per day of interdisciplinary therapy in a comprehensive inpatient rehab  setting. Physiatrist is providing close team supervision and 24 hour management of active medical problems listed below. Physiatrist and rehab team continue to assess barriers to discharge/monitor patient progress toward functional and medical goals  Care Tool:  Bathing              Bathing assist       Upper Body Dressing/Undressing Upper body dressing        Upper body assist      Lower Body Dressing/Undressing Lower body dressing            Lower body assist       Toileting Toileting    Toileting assist       Transfers Chair/bed transfer  Transfers assist           Locomotion Ambulation   Ambulation assist              Walk 10 feet activity   Assist           Walk 50 feet activity   Assist           Walk 150 feet activity   Assist           Walk 10 feet on uneven surface  activity  Assist           Wheelchair     Assist               Wheelchair 50 feet with 2 turns activity    Assist            Wheelchair 150 feet activity     Assist          Blood pressure (!) 147/83, pulse 81, temperature 98.6 F (37 C), temperature source Oral, resp. rate 18, weight 75 kg, SpO2 99 %.  Medical Problem List and Plan: 1. Functional deficits secondary to multiple L brain stroke/infarcts- precentral and post central gyri             -patient may  shower             -ELOS/Goals: 7-10 days mod I to supervision, initial PT, OT SLP evals    2.  Antithrombotics: -DVT/anticoagulation:  Pharmaceutical: Lovenox>>start 40 mg daily             -antiplatelet therapy: Aspirin and Plavix for three weeks followed by aspirin alone (started 5/22)   3. Pain Management: Tylenol as needed RIght shoulder exam unremarkable will monitor in therapy    4. Mood/Behavior/Sleep: LCSW to evaluate and provide emotional support             -antipsychotic agents: n/a   5. Neuropsych/cognition: This patient is  capable of making decisions on his own behalf.   6. Skin/Wound Care: Routine skin care checks   7. Fluids/Electrolytes/Nutrition: Routine Is and Os and follow-up chemistries   8: Hypertension: monitor TID and prn (home Zestoretic 20/12.5 daily)             -continue amlodipine 5 mg daily             -continue lisinopril 20 mg daily Vitals:   04/24/23 1448 04/25/23 0552  BP: 122/76 (!) 147/83  Pulse: 91 81  Resp: 16 18  Temp: 98.6 F (37 C)   SpO2: 98% 99%   Good control , monitor with activity    9: Hyperlipidemia: "Discussed Leqvio injection with patient and family pending insurance authorization. Form faxed to Leqvio. May need outpatient follow up with lipid clinic in the setting of hyperlipidemia and statin intolerance." >> per Dr. 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              -consider 30 day event monitor per neurology   12: GERD: s/p esophageal dil in 2023: continue Protonix   13: OA right knee- getting home R knee brace, add aspercreme, consider injection if needed     LOS: 1 days A FACE TO FACE EVALUATION WAS PERFORMED  Erick Colace 04/25/2023, 7:55 AM

## 2023-04-25 NOTE — IPOC Note (Signed)
Overall Plan of Care [facility]) Patient Details Name: Ethan Payne MRN: [id number] DOB: 1946-11-13  Admitting Diagnosis: Acute embolic stroke [facility])  Hospital Problems: Principal Problem:   Acute embolic stroke [facility])     Functional Problem List: Nursing    PT Other (comment) (pt with chronic R knee OA pain pta)  OT  (n/a)  SLP    TR         Basic ADL's: OT  (n/a)     Advanced  ADL's: OT       Transfers: PT Other (comment) (n/a)  OT  (n/a)     Locomotion: PT Other (comment) (n/a)     Additional Impairments: OT  (n/a)  SLP        TR      Anticipated Outcomes Item Anticipated Outcome  Self Feeding n/a pt is independent  Swallowing      Basic self-care  n/a pt is independent  Toileting  n/a pt is independent   Bathroom Transfers n/a pt is independent  Bowel/Bladder     Transfers  IND  Locomotion  IND  Communication     Cognition     Pain     Safety/Judgment      Therapy Plan: PT Intensity:  (n/a) PT Frequency:  (n/a) PT Duration Estimated Length of Stay: n/a - no impairments noted to indicate IR stay OT Intensity:  (n/a) OT Frequency:  (n/a) OT Duration/Estimated Length of Stay: 0 days, pt ready for discharge     Team Interventions: Nursing Interventions    PT interventions Patient/family education  OT Interventions Patient/family education  SLP Interventions    TR Interventions    SW/CM Interventions Discharge Planning, Psychosocial Support, Patient/Family Education, Disease Management/Prevention   Barriers to Discharge MD  Medical stability  Nursing      PT Other (comments) (n/a) n/a  OT  (n/a)    SLP      SW Insurance for SNF coverage     Team Discharge Planning: Destination: PT-Home ,OT- Home , SLP-  Projected Follow-up: PT-None, Outpatient PT (no neuro PT follow up - will require OPPT for R knee OA pain), OT-  None, SLP-  Projected Equipment Needs: PT-None recommended by PT, OT- None recommended by OT, SLP-  Equipment  Details: PT- , OT-  Patient/family involved in discharge planning: PT- Patient, Family member/caregiver,  OT-Patient, Family member/caregiver, SLP-   MD ELOS: 5-7d Medical Rehab Prognosis:  Excellent Assessment: The patient has been admitted for CIR therapies with the diagnosis of Acute embolic CVA. The team will be addressing functional mobility, strength, stamina, balance, safety, adaptive techniques and equipment, self-care, bowel and bladder mgt, patient and caregiver education, monitor neurologic status . Goals have been set at Mod I. Anticipated discharge destination is Home.        See Team Conference Notes for weekly updates to the plan of care

## 2023-04-25 NOTE — Code Documentation (Signed)
Stroke Response Nurse Documentation Code Documentation  Ethan Payne is a 77 y.o. male admitted to [facility]  on 04/21/2023 for a stroke with past medical hx of HTN and HLD. On No antithrombotic. Code stroke was activated by Paulino Rily .   Patient on 32M unit where he was LKW at 1320 and now complaining of right hand/arm numbness/weakness. Patient PT/OT this morning/early afternoon and was feeling fine. At 1320 patient started to experience right hand/arm numbness/weakness/burning.   Stroke team at the bedside after patient activation. Patient to CT with team. NIHSS 1, see documentation for details and code stroke times. Patient with right arm weakness on exam. The following imaging was completed:  CT Head. Patient is not a candidate for IV Thrombolytic due to receiving TNK on 04/21/2023. Patient is not a candidate for IR due to no LVO noted on imaging.   Care/Plan: VS/NIHSS Q2hr x12, then Q4hr; BP Goal <220/120.   Bedside handoff with RN Morrie Sheldon.    Felecia Jan  Stroke Response RN

## 2023-04-25 NOTE — Progress Notes (Signed)
Inpatient Rehabilitation  Patient information reviewed and entered into eRehab system by Letonia Stead Roniesha Hollingshead, OTR/L, Rehab Quality Coordinator.   Information including medical coding, functional ability and quality indicators will be reviewed and updated through discharge.   

## 2023-04-25 NOTE — Progress Notes (Signed)
Patient ID: Ethan Payne, male   DOB: 12-28-1945, 77 y.o.   MRN: [id number]  No DME recommendations. OP referral faxed to OP at [facility].

## 2023-04-25 NOTE — Evaluation (Signed)
Occupational Therapy Assessment and Plan  Patient Details  Name: Ethan Payne MRN: [id number] Date of Birth: 28-Feb-1946  OT Diagnosis:  R knee pain from osteoarthritis, recent CVA with good return in function  Rehab Potential: Rehab Potential (ACUTE ONLY): Excellent ELOS: 0 days, pt ready for discharge   Today's Date: 04/25/2023 OT Individual Time: 1045-1200 OT Individual Time Calculation (min): 75 min   (evaluation and education only)  Hospital Problem: Principal Problem:   Acute embolic stroke [facility])   Past Medical History:  Past Medical History:  Diagnosis Date   Acute prostatitis 02/04/2008   ATRIAL MYXOMA 02/04/2008   Closed fracture of four ribs 11/01/2009   HYPERLIPIDEMIA 02/04/2008   HYPERTENSION 02/04/2008   Osteoarthritis of right knee 04/18/2010   Past Surgical History:  Past Surgical History:  Procedure Laterality Date   CATARACT EXTRACTION Left 08/03/2019   CORONARY ARTERY BYPASS GRAFT  2007   x2 Saph vein   COX-MAZE MICROWAVE ABLATION  2007   EXCISION OF ATRIAL MYXOMA  2007   Dr C. Cornelius Moras    Assessment & Plan Clinical Impression:  Ethan Payne is a 77 year old male who presented to the [facility] ED on 04/21/2023 with acute right-sided weakness. Code stroke initiated. Head CT showed no acute process. Evaluated by Dr. Selina Cooley and risks, benefits, and alternatives to TNK were discussed with patient who gave informed consent to proceed after further discussion with his adult son. CTA showed no LVO therefore no intervention indicated. TNK administered at 18:23 hours. Admitted to ICU. On 5/21, right weakness had improved, right facial droop present, some ataxia continued in right arm, weaker grip right hand, right hand bilateral sensation intact. Blood pressure adequately controlled. 2D Echo: EF 55 to 60%, mild concentric LVH, trivial MVR, trivial AVR, no evidence of left atrial mass, no shunt. LDL 219, HgbA1c 6.1%. VTE prophylaxis - SCDs. On 5/22, the patient has reported right upper  extremity proximal weakness due to rotator cuff tear and right leg weakness due to a "bad knee". Right grip remains weaker, mild right facial droop persists. MRI brain with multiple small foci of acute ischemia along the left precentral and postcentral gyri. No hemorrhage or mass effect. Old bilateral cerebellar small vessel infarcts and findings of chronic follows all ischemia. Started on aspirin and Plavix for 21 days followed by aspirin alone. Patient reports statin intolerance and is refusing further statin therapy Discussed Leqvio injections with patient pending insurance authorization. May need outpatient follow up with lipid clinic in the setting of hyperlipidemia and statin intolerance per Dr. Pearlean Brownie. The patient has impairment mobility needing up to mod assist for ambulation due to right LE deficits. HE fatigues quickly, has poor safety awareness and appears to have some right inattention as well.  He is tolerating regular diet. Norvasc and Zestril continue. The patient requires inpatient medicine and rehabilitation evaluations and services for ongoing dysfunction secondary to acute ischemia along the left precentral and postcentral gyri.  Patient transferred to CIR on 04/24/2023 .    Patient currently requires independent  with basic self-care skills..  Prior to hospitalization, patient was fully independent.    Pt is ready for discharge.  He does not need any further OT services.  Recommending pt wait a few weeks to return to drive and work. Recommend pt start with light housework/ yardwork tasks.   OT - End of Session Activity Tolerance: Tolerates 30+ min activity without fatigue Endurance Deficit: No OT Assessment Rehab Potential (ACUTE ONLY): Excellent OT Barriers to Discharge:  (  n/a) OT Patient demonstrates impairments in the following area(s):  (n/a) OT Basic ADL's Functional Problem(s):  (n/a) OT Transfers Functional Problem(s):  (n/a) OT Additional Impairment(s):  (n/a) OT  Plan OT Intensity:  (n/a) OT Frequency:  (n/a) OT Duration/Estimated Length of Stay: 0 days, pt ready for discharge OT Treatment/Interventions: Patient/family education OT Self Feeding Anticipated Outcome(s): n/a pt is independent OT Basic Self-Care Anticipated Outcome(s): n/a pt is independent OT Toileting Anticipated Outcome(s): n/a pt is independent OT Bathroom Transfers Anticipated Outcome(s): n/a pt is independent OT Recommendation Patient destination: Home Follow Up Recommendations: None Equipment Recommended: None recommended by OT   OT Evaluation Precautions/Restrictions  Precautions Precautions: Fall Restrictions Weight Bearing Restrictions: No    Vital Signs Therapy Vitals Temp: 98 F (36.7 C) Temp Source: Oral BP: (Abnormal) 141/86 Pain Pain Assessment Pain Scale: 0-10 Pain Score: 0-No pain Home Living/Prior Functioning Home Living Family/patient expects to be discharged to:: Private residence Living Arrangements: Spouse/significant other Available Help at Discharge: Family, Available 24 hours/day Type of Home: House Home Access: Stairs to enter Secretary/administrator of Steps: 4 Entrance Stairs-Rails: None Home Layout: Two level, Able to live on main level with bedroom/bathroom Bathroom Shower/Tub: Tub/shower unit, Health visitor: Standard Bathroom Accessibility: Yes  Lives With: Spouse Prior Function Level of Independence: Independent with basic ADLs, Independent with homemaking with ambulation, Independent with gait, Independent with transfers  Able to Take Stairs?: Yes Driving: Yes Vocation: Full time employment Vision Baseline Vision/History: 0 No visual deficits Ability to See in Adequate Light: 0 Adequate Patient Visual Report: No change from baseline Vision Assessment?: No apparent visual deficits Eye Alignment: Within Functional Limits Ocular Range of Motion: Within Functional Limits Alignment/Gaze Preference: Within  Defined Limits Tracking/Visual Pursuits: Able to track stimulus in all quads without difficulty Saccades: Within functional limits Convergence: Within functional limits Visual Fields: No apparent deficits Perception  Perception: Within Functional Limits Praxis Praxis: Intact Cognition Cognition Overall Cognitive Status: Within Functional Limits for tasks assessed Arousal/Alertness: Awake/alert Orientation Level: Person;Place;Situation Person: Oriented Place: Oriented Situation: Oriented Memory: Appears intact Awareness: Appears intact Problem Solving: Appears intact Safety/Judgment: Appears intact Brief Interview for Mental Status (BIMS) Repetition of Three Words (First Attempt): 3 Temporal Orientation: Year: Correct Temporal Orientation: Month: Accurate within 5 days Temporal Orientation: Day: Correct Recall: "Sock": Yes, no cue required Recall: "Blue": Yes, no cue required Recall: "Bed": Yes, no cue required BIMS Summary Score: 15 Sensation Sensation Light Touch: Appears Intact (pt can feel touch,  pt reports sense of "heaviness" in R arm but able to move arm functionally  Simultaneous filing. User may not have seen previous data.) Hot/Cold: Appears Intact Proprioception: Appears Intact Stereognosis: Appears Intact Coordination Gross Motor Movements are Fluid and Coordinated: Yes (Simultaneous filing. User may not have seen previous data.) Fine Motor Movements are Fluid and Coordinated: Yes (Simultaneous filing. User may not have seen previous data.) Finger Nose Finger Test: [facility] Heel Shin Test: EFL bilaterally 9 Hole Peg Test: left hand 27 seconds, Right hand 41 seconds;  Box and Block L hand 52 blocks and R hand 45 blocks (R hand slightly slower due to pt feeling a sense of heaviness in arm) Motor  Motor Motor: Within Functional Limits Motor - Skilled Clinical Observations: only motor deficits d/t R knee OA pain  Trunk/Postural Assessment  Postural Control Postural  Control: Within Functional Limits  Balance Standardized Balance Assessment Standardized Balance Assessment: Berg Balance Test;Functional Gait Assessment Dynamic Standing Balance Dynamic Standing - Level of Assistance: 7: Independent Extremity/Trunk Assessment RUE  Assessment RUE Assessment: Within Functional Limits General Strength Comments: R hand grip strength 107 lbs (vs 103 on L ),  R hand lateral pinch 22 lbs (vs 21 on L ) LUE Assessment LUE Assessment: Within Functional Limits  Care Tool Care Tool Self Care Eating   Eating Assist Level: Independent    Oral Care    Oral Care Assist Level: Independent    Bathing   Body parts bathed by patient: Right arm;Left arm;Chest;Abdomen;Front perineal area;Buttocks;Right upper leg;Left upper leg;Face;Left lower leg;Right lower leg     Assist Level: Independent    Upper Body Dressing(including orthotics)   What is the patient wearing?: Pull over shirt   Assist Level: Independent    Lower Body Dressing (excluding footwear)   What is the patient wearing?: Underwear/pull up;Pants Assist for lower body dressing: Independent    Putting on/Taking off footwear   What is the patient wearing?: Shoes;Socks Assist for footwear: Independent       Care Tool Toileting Toileting activity   Assist for toileting: Independent     Care Tool Bed Mobility Roll left and right activity   Roll left and right assist level: Independent    Sit to lying activity   Sit to lying assist level: Independent    Lying to sitting on side of bed activity   Lying to sitting on side of bed assist level: the ability to move from lying on the back to sitting on the side of the bed with no back support.: Independent     Care Tool Transfers Sit to stand transfer   Sit to stand assist level: Independent    Chair/bed transfer   Chair/bed transfer assist level: Independent     Toilet transfer   Assist Level: Independent     Care Tool  Cognition  Expression of Ideas and Wants Expression of Ideas and Wants: 4. Without difficulty (complex and basic) - expresses complex messages without difficulty and with speech that is clear and easy to understand  Understanding Verbal and Non-Verbal Content Understanding Verbal and Non-Verbal Content: 4. Understands (complex and basic) - clear comprehension without cues or repetitions   Memory/Recall Ability Memory/Recall Ability : Current season;Location of own room;Staff names and faces;That he or she is in a hospital/hospital unit        Recommendations for other services: None    Skilled Therapeutic Intervention ADL ADL Eating: Independent Grooming: Independent Upper Body Bathing: Independent Lower Body Bathing: Independent Upper Body Dressing: Independent Lower Body Dressing: Independent Toileting: Independent Toilet Transfer: Independent Toilet Transfer Method: Ambulating Tub/Shower Transfer: Designer, industrial/product Method: Ambulating Tub/Shower Equipment: Other (comment) (no equipment needed, pt able to step over tub wall without UE support) Mobility  Bed Mobility Bed Mobility: Sit to Supine;Supine to Sit Supine to Sit: Independent with assistive device Sit to Supine: Independent Transfers Sit to Stand: Independent Stand to Sit: Independent   Pt received in room with wife and son present.   Explained role of OT and today was to assess what deficits pt may have from his recent CVA.  Pt actively participated in numerous assessments for this 75 minute evaluation.  He demonstrated excellent balance to shower (simulated as pt had already dressed in earlier PT session) and dress in standing, he is able to step over tub wall with no UE support, carry a heavy 20 lb box 50 ft,  fasten heavy nuts and bolts,  complete a simulated yard work activity of using a "weed wacker " using a hockey stick, walking  independently in hospital hallways over 1000 ft.  Pt does not need OT  services.    Pt is ready for discharge.  He does not need any further OT services.  Recommending pt wait a few weeks to return to drive and work. Recommend pt start with light housework/ yardwork tasks. Pt agreeable and stated he is ready to go home.  Discharge Criteria: Patient will be discharged from OT if patient refuses treatment 3 consecutive times without medical reason, if treatment goals not met, if there is a change in medical status, if patient makes no progress towards goals or if patient is discharged from hospital.  The above assessment, treatment plan, treatment alternatives and goals were discussed and mutually agreed upon: by patient and by family  Marialuiza Car 04/25/2023, 12:52 PM

## 2023-04-25 NOTE — Discharge Instructions (Signed)
Inpatient Rehab Discharge Instructions  Ethan Payne Discharge date and time:  04/28/2023  Activities/Precautions/ Functional Status: Activity: no lifting, driving, or strenuous exercise until cleared by MD Diet: diabetic diet Wound Care: none needed Functional status:  ___ No restrictions     ___ Walk up steps independently ___ 24/7 supervision/assistance   ___ Walk up steps with assistance __x_ Intermittent supervision/assistance  ___ Bathe/dress independently ___ Walk with walker     ___ Bathe/dress with assistance ___ Walk Independently    ___ Shower independently ___ Walk with assistance    __x_ Shower with assistance _x__ No alcohol     ___ Return to work/school ________  Special Instructions:  No driving, alcohol consumption or tobacco use.  COMMUNITY REFERRALS UPON DISCHARGE:    Outpatient: PT             Agency:[facility] Phone:[phone]              Appointment Date/Time:WILL CALL TO MAKE FOLLOW UP APPOINTMENTS  Medical Equipment/Items Ordered: NONE RECOMMENDED                                                 Agency/Supplier:NA   STROKE/TIA DISCHARGE INSTRUCTIONS SMOKING Cigarette smoking nearly doubles your risk of having a stroke & is the single most alterable risk factor  If you smoke or have smoked in the last 12 months, you are advised to quit smoking for your health. Most of the excess cardiovascular risk related to smoking disappears within a year of stopping. Ask you doctor about anti-smoking medications [place] Quit Line: 1-800-QUIT NOW Free Smoking Cessation Classes [phone]  CHOLESTEROL Know your levels; limit fat & cholesterol in your diet  Lipid Panel     Component Value Date/Time   CHOL 289 (H) 04/22/2023 0204   TRIG 100 04/22/2023 0204   HDL 50 04/22/2023 0204   CHOLHDL 5.8 04/22/2023 0204   VLDL 20 04/22/2023 0204   LDLCALC 219 (H) 04/22/2023 0204     Many patients benefit from treatment even if their cholesterol is at  goal. Goal: Total Cholesterol (CHOL) less than 160 Goal:  Triglycerides (TRIG) less than 150 Goal:  HDL greater than 40 Goal:  LDL (LDLCALC) less than 100   BLOOD PRESSURE American Stroke Association blood pressure target is less that 120/80 mm/Hg  Your discharge blood pressure is:  BP: (!) 148/87 Monitor your blood pressure Limit your salt and alcohol intake Many individuals will require more than one medication for high blood pressure  DIABETES (A1c is a blood sugar average for last 3 months) Goal HGBA1c is under 7% (HBGA1c is blood sugar average for last 3 months)  Diabetes: Diagnosis of diabetes:  Your A1c:6.1 %   6.1 Lab Results  Component Value Date   HGBA1C 6.1 (H) 04/21/2023    Your HGBA1c can be lowered with medications, healthy diet, and exercise. Check your blood sugar as directed by your physician Call your physician if you experience unexplained or low blood sugars.  PHYSICAL ACTIVITY/REHABILITATION Goal is 30 minutes at least 4 days per week  Activity: Increase activity slowly, Therapies: Physical Therapy: Outpatient Return to work: not applicable Activity decreases your risk of heart attack and stroke and makes your heart stronger.  It helps control your weight and blood pressure; helps you relax and can improve your mood. Participate in a  regular exercise program. Talk with your doctor about the best form of exercise for you (dancing, walking, swimming, cycling).  DIET/WEIGHT Goal is to maintain a healthy weight  Your discharge diet is:  Diet Order             Diet regular Room service appropriate? Yes; Fluid consistency: Thin  Diet effective now                  thin liquids Your height is:  Height: 5\' 9"  (175.3 cm) Your current weight is: Weight: 75 kg Your Body Mass Index (BMI) is:  BMI (Calculated): 24.41 Following the type of diet specifically designed for you will help prevent another stroke. Your goal weight range is:   Your goal Body Mass Index  (BMI) is 19-24. Healthy food habits can help reduce 3 risk factors for stroke:  High cholesterol, hypertension, and excess weight.  RESOURCES Stroke/Support Group:  Call [phone]   STROKE EDUCATION PROVIDED/REVIEWED AND GIVEN TO PATIENT Stroke warning signs and symptoms How to activate emergency medical system (call 911). Medications prescribed at discharge. Need for follow-up after discharge. Personal risk factors for stroke. Pneumonia vaccine given: No Flu vaccine given: No My questions have been answered, the writing is legible, and I understand these instructions.  I will adhere to these goals & educational materials that have been provided to me after my discharge from the hospital.     My questions have been answered and I understand these instructions. I will adhere to these goals and the provided educational materials after my discharge from the hospital.  Patient/Caregiver Signature _______________________________ Date __________  Clinician Signature _______________________________________ Date __________  Please bring this form and your medication list with you to all your follow-up doctor's appointments.

## 2023-04-25 NOTE — Progress Notes (Signed)
Call from nursing regarding new neurologic symptoms. Family in room and noticed facial asymmetry and patient reports new right forearm weakness, tingling and numbness. These were his presenting symptoms at admission on 5/20. He had OT session this morning and did well. He is sitting in chair oriented times 4, follows commands and moving all extremities well. Right grip strength 4/5. Slightly dysarthric. VS are stable, mildly tachycardic, heart rhythm is regular. Code stroke initiated and evaluated by Dr. Pearlean Brownie. Symptoms rapidly improving. Doubt new stroke, most likely fatigue and dehydration contributing to symptomology. IVFs ordered. CT head to rule out bleed. Updated family.

## 2023-04-25 NOTE — Progress Notes (Signed)
STROKE TEAM PROGRESS NOTE   INTERVAL HISTORY His wife and daughter are   at the bedside.   Stroke team was called back to evaluate the patient as a code stroke was called.  Patient developed sudden onset of right facial asymmetry and right forearm weakness and numbness which was similar to his presenting symptoms from his initial stroke on 04/21/2023.  When evaluated at the bedside patient's symptoms appear to be improving though he has some diminished right hand fine finger movements speech was improved.  Stat CT scan of the head was obtained which showed no acute abnormality.  Patient was advised to rest in bed and given IV normal saline bolus followed by IV infusion.  Patient had earlier work with physical therapy and appeared to be tired.  Labs from this morning had shown slightly elevated creatinine of 1.27 and patient and family admitted he was not drinking enough fluids.  Vitals:   04/25/23 1311 04/25/23 1336 04/25/23 1405 04/25/23 1638  BP: 114/73 (!) 148/82 (!) 153/88 (!) 158/80  Pulse: (!) 108 (!) 108 99 79  Resp: 16  16 17   Temp: 98.4 F (36.9 C)   98.2 F (36.8 C)  TempSrc: Oral     SpO2: 99% 100% 99% 99%  Weight:      Height:       CBC:  Recent Labs  Lab 04/21/23 1800 04/21/23 1805 04/22/23 0204  WBC 9.9  --  10.5  NEUTROABS 5.6  --  6.7  HGB 14.2 13.9 14.2  HCT 42.1 41.0 41.1  MCV 90.5  --  88.6  PLT 261  --  256   Basic Metabolic Panel:  Recent Labs  Lab 04/22/23 0204 04/25/23 0539  NA 137 135  K 4.2 3.8  CL 102 102  CO2 27 25  GLUCOSE 121* 113*  BUN 11 17  CREATININE 1.14 1.27*  CALCIUM 9.2 8.9  MG 2.0  --    Lipid Panel:  Recent Labs  Lab 04/22/23 0204  CHOL 289*  TRIG 100  HDL 50  CHOLHDL 5.8  VLDL 20  LDLCALC 130*   HgbA1c:  Recent Labs  Lab 04/21/23 1800  HGBA1C 6.1*   Urine Drug Screen: No results for input(s): "LABOPIA", "COCAINSCRNUR", "LABBENZ", "AMPHETMU", "THCU", "LABBARB" in the last 168 hours.  Alcohol Level  Recent Labs   Lab 04/21/23 1800  ETH <10   IMAGING past 24 hours CT HEAD CODE STROKE WO CONTRAST  Result Date: 04/25/2023 CLINICAL DATA:  Code stroke. Neuro deficit, acute, stroke suspected RUE weakness EXAM: CT HEAD WITHOUT CONTRAST TECHNIQUE: Contiguous axial images were obtained from the base of the skull through the vertex without intravenous contrast. RADIATION DOSE REDUCTION: This exam was performed according to the departmental dose-optimization program which includes automated exposure control, adjustment of the mA and/or kV according to patient size and/or use of iterative reconstruction technique. COMPARISON:  CT head Apr 21, 2023. FINDINGS: Brain: Known left perirolandic acute infarct better characterized on recent MRI. No evidence of interval acute large vascular territory infarction, hemorrhage, hydrocephalus, extra-axial collection or mass lesion/mass effect. Patchy white matter hypodensities, nonspecific but compatible with chronic microvascular ischemic disease. Vascular: No hyperdense vessel identified. Skull: No acute fracture. Sinuses/Orbits: Clear sinuses.  No acute orbital findings. Other: No mastoid effusions. ASPECTS [facility] Stroke Program Early CT Score) total score (0-10 with 10 being normal): 10 when measured for new/interval abnormality. IMPRESSION: 1. Known left perirolandic acute infarct better characterized on recent MRI. 2. No evidence of acute large vascular  territory infarct or acute hemorrhage. Code stroke imaging results were communicated on 04/25/2023 at 2:26 pm to provider Pearlean Brownie via telephone, who verbally acknowledged these results. Electronically Signed   By: Feliberto Harts M.D.   On: 04/25/2023 14:27    PHYSICAL EXAM General: Sitting up in bedside chair, no acute distress HEENT: Atraumatic, normocephalic Respiratory: Unlabored breathing, room air CV: RRR, symmetric peripheral pulses Abdomen: Soft nontender  Neuro: MS: AOx4, follows commands. Speech: Fluid, no  dysarthria or neglect present naming and repetition intact. CN: Visual fields full, EOMI without nystagmus, facial sensation intact, right-sided facial droop, hard of hearing at baseline, symmetrical head turn and shoulder shrug, tongue with midline protrusion Motor: RUE/RLE 4/5, 4/5 grip.  Diminished fine finger movements on the right.  Orbits left or right upper extremity. Sensation: Light touch intact. Coordination: Ataxia to RUE with finger-to-nose.  Rapid alternating movements intact heel-to-shin intact bilaterally Gait: Deferred  ASSESSMENT/PLAN Mr. ARLOS KRUMM is a 77 y.o. male with history of hypertension, hyperlipidemia Patient presented as code stroke 5/20 due to acute onset of right-sided weakness. Denied speech or vision issues.  CT showed no acute process.  Patient received TNK in the ED at 1823.  No intervention due to no LVO on CTA.  Multiple small foci of acute ischemia along the left precentral and potential direct s/p TNK administration. Etiology:  Ischemia due to small vessel disease  Secondary risk factors include: Hypertension, hyperlipidemia, prediabetes Transient neurological worsening of old deficits on 04/25/2023 likely in the setting of dehydration and exertion.  Doubt new stroke Code Stroke CT head: No acute abnormality.  ASPECTS 10.    CTA head & neck: No LVO MRI: Multiple small foci of acute ischemia along the left precentral impression to try.  No hemorrhage or mass effect.  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 2D Echo: EF 55 to 60%, mild concentric LVH, trivial MVR, trivial AVR, no evidence of left atrial mass, no shunt LDL 219 HgbA1c 6.1 VTE prophylaxis - SCDs May need 30-day cardiac event monitor at discharge    Diet   Diet regular Room service appropriate? Yes; Fluid consistency: Thin   No antithrombotic prior to admission, now on aspirin 81 mg daily and clopidogrel 75 mg daily for 21 days followed  by aspirin monotherapy  Therapy recommendations: CIR Disposition:  pending  Hypertension Home meds: Norvasc 5 mg, lisinopril 20 mg Stable Cleviprex gtt off since 5/21  Resume home medications  Long-term BP goal normotensive  Hyperlipidemia Home meds: None LDL 219, goal < 70 Patient reports statin intolerance and is refusing further statin therapy Discussed Leqvio injections with patient pending insurance authorization May need outpatient follow up with lipid clinic in the setting of hyperlipidemia and statin intolerance  Diabetes type II, Pre-Diabetic Home meds:  none HgbA1c 6.1, goal < 7.0 CBGs Recent Labs    04/23/23 0717 04/25/23 1401  GLUCAP 118* 136*    SSI Recommend close PCP follow-up as A1c is within prediabetic range  Other Stroke Risk Factors Advanced Age >/= 15  Hx stroke/TIA Family hx stroke (stroke)  Other Active Problems   Patient had transient worsening of his neurological deficits I doubt this is the presence of new stroke.  Likely worsening of recent deficit in setting of mild dehydration physical exertion.  Recommend bedrest today and IV hydration.  Long discussion with patient and wife and family at the bedside and answered questions.   Discussed with rehab coordinator.  Greater than 50%  time during this 50-minute visit was spent on counseling and coordination of care about his recent stroke and transient worsening of deficits.  Stroke team will sign off.  Kindly call for questions.  Delia Heady, MD Medical Director [facility] Pager: [phone] 04/25/2023 5:11 PM  To contact Stroke Continuity provider, please refer to [email or web address]. After hours, contact General Neurology

## 2023-04-25 NOTE — Evaluation (Signed)
Physical Therapy Assessment and Plan  Patient Details  Name: Ethan Payne MRN: [id number] Date of Birth: [birth year]  PT Diagnosis: OA Pain in R knee Rehab Potential: Good ELOS: n/a - no impairments noted to indicate IR stay   Today's Date: 04/25/2023 PT Individual Time: 4098-1191 PT Individual Time Calculation (min): 75 min    EVAL ONLY  Hospital Problem: Principal Problem:   Acute embolic stroke [facility])   Past Medical History:  Past Medical History:  Diagnosis Date   Acute prostatitis 02/04/2008   ATRIAL MYXOMA 02/04/2008   Closed fracture of four ribs 11/01/2009   HYPERLIPIDEMIA 02/04/2008   HYPERTENSION 02/04/2008   Osteoarthritis of right knee 04/18/2010   Past Surgical History:  Past Surgical History:  Procedure Laterality Date   CATARACT EXTRACTION Left 08/03/2019   CORONARY ARTERY BYPASS GRAFT  2007   x2 Saph vein   COX-MAZE MICROWAVE ABLATION  2007   EXCISION OF ATRIAL MYXOMA  2007   Dr C. Cornelius Moras    Assessment & Plan Clinical Impression: Patient is a 77 y.o. male who presented to the [facility] ED on 04/21/2023 with acute right-sided weakness. Code stroke initiated. Head CT showed no acute process. Evaluated by Dr. Selina Cooley and risks, benefits, and alternatives to TNK were discussed with patient who gave informed consent to proceed after further discussion with his adult son. CTA showed no LVO therefore no intervention indicated. TNK administered at 18:23 hours. Admitted to ICU. On 5/21, right weakness had improved, right facial droop present, some ataxia continued in right arm, weaker grip right hand, right hand bilateral sensation intact. Blood pressure adequately controlled. 2D Echo: EF 55 to 60%, mild concentric LVH, trivial MVR, trivial AVR, no evidence of left atrial mass, no shunt. LDL 219, HgbA1c 6.1%. VTE prophylaxis - SCDs. On 5/22, the patient has reported right upper extremity proximal weakness due to rotator cuff tear and right leg weakness due to a "bad knee". Right grip  remains weaker, mild right facial droop persists. MRI brain with multiple small foci of acute ischemia along the left precentral and postcentral gyri. No hemorrhage or mass effect. Old bilateral cerebellar small vessel infarcts and findings of chronic follows all ischemia. Started on aspirin and Plavix for 21 days followed by aspirin alone. Patient reports statin intolerance and is refusing further statin therapy Discussed Leqvio injections with patient pending insurance authorization. May need outpatient follow up with lipid clinic in the setting of hyperlipidemia and statin intolerance per Dr. Pearlean Brownie. The patient has impairment mobility needing up to mod assist for ambulation due to right LE deficits. HE fatigues quickly, has poor safety awareness and appears to have some right inattention as well.  He is tolerating regular diet. Norvasc and Zestril continue. The patient requires inpatient medicine and rehabilitation evaluations and services for ongoing dysfunction secondary to acute ischemia along the left precentral and postcentral gyri.   Pt reports tingling and sensory changes he had initially have basically resolved. LBM was today and voiding using urinal and BSC. No pain- no HA's. No visual changes.  Patient transferred to CIR on 04/24/2023 .   Patient currently is independent  with functional mobility.  Prior to hospitalization, patient was independent  with mobility and lived with Spouse in a House home.  Home access is 4Stairs to enter.  Patient's functional mobility has significantly improved from time between acute care and move to Inpatient Rehab. At time of eval, pt is functionally performing at higher level and not demonstrating need for IP Rehab skilled  PT intervention.  Plan is for discharge home with 24 hour availability for wife and family to provide prn supervision .  Anticipate patient will benefit from follow up OP PT for chronic R knee OA pain at discharge.  PT - End of  Session Activity Tolerance: Tolerates 30+ min activity without fatigue;Endurance does not limit participation in activity Endurance Deficit: No PT Assessment Rehab Potential (ACUTE/IP ONLY): Good PT Barriers to Discharge: Other (comments) (n/a) PT Barriers to Discharge Comments: n/a PT Patient demonstrates impairments in the following area(s): Other (comment) (pt with chronic R knee OA pain pta) PT Transfers Functional Problem(s): Other (comment) (n/a) PT Locomotion Functional Problem(s): Other (comment) (n/a) PT Plan PT Intensity:  (n/a) PT Frequency:  (n/a) PT Duration Estimated Length of Stay: n/a - no impairments noted to indicate IR stay PT Treatment/Interventions: Patient/family education PT Transfers Anticipated Outcome(s): IND PT Locomotion Anticipated Outcome(s): IND PT Recommendation Follow Up Recommendations: None;Outpatient PT (no neuro PT follow up - will require OPPT for R knee OA pain) Patient destination: Home Equipment Recommended: None recommended by PT   PT Evaluation Precautions/Restrictions Precautions Precautions: Fall Restrictions Weight Bearing Restrictions: No General   Vital SignsTherapy Vitals Temp: 98.4 F (36.9 C) Temp Source: Oral Pulse Rate: (!) 108 Resp: 16 BP: 114/73 Patient Position (if appropriate): Sitting Oxygen Therapy SpO2: 99 % O2 Device: Room Air Pain Pain Assessment Pain Scale: 0-10 Pain Score: 0-No pain Pain Interference Pain Interference Pain Effect on Sleep: 0. Does not apply - I have not had any pain or hurting in the past 5 days Pain Interference with Therapy Activities: 0. Does not apply - I have not received rehabilitationtherapy in the past 5 days;1. Rarely or not at all Pain Interference with Day-to-Day Activities: 1. Rarely or not at all;2. Occasionally (R knee OA pain) Home Living/Prior Functioning Home Living Living Arrangements: Spouse/significant other Available Help at Discharge: Family;Available 24  hours/day Type of Home: House Home Access: Stairs to enter [employer] of Steps: 4 Entrance Stairs-Rails: None Home Layout: Two level;Able to live on main level with bedroom/bathroom Bathroom Shower/Tub: Tub/shower unit;Walk-in shower Bathroom Toilet: Standard Bathroom Accessibility: Yes  Lives With: Spouse Prior Function Level of Independence: Independent with basic ADLs;Independent with homemaking with ambulation;Independent with gait;Independent with transfers  Able to Take Stairs?: Yes Driving: Yes Vocation: Full time employment Vision/Perception  Vision - History Ability to See in Adequate Light: 0 Adequate Vision - Assessment Eye Alignment: Within Functional Limits Ocular Range of Motion: Within Functional Limits Alignment/Gaze Preference: Within Defined Limits Tracking/Visual Pursuits: Able to track stimulus in all quads without difficulty Saccades: Within functional limits Convergence: Within functional limits Perception Perception: Within Functional Limits Praxis Praxis: Intact  Cognition Overall Cognitive Status: Within Functional Limits for tasks assessed Arousal/Alertness: Awake/alert Orientation Level: Oriented X4 Memory: Appears intact Awareness: Appears intact Problem Solving: Appears intact Safety/Judgment: Appears intact Sensation Sensation Light Touch: Appears Intact (pt can feel touch,  pt reports sense of "heaviness" in R arm but able to move arm functionally  Simultaneous filing. User may not have seen previous data.) Hot/Cold: Appears Intact Proprioception: Appears Intact Stereognosis: Appears Intact Coordination Gross Motor Movements are Fluid and Coordinated: Yes (Simultaneous filing. User may not have seen previous data.) Fine Motor Movements are Fluid and Coordinated: Yes (Simultaneous filing. User may not have seen previous data.) Finger Nose Finger Test: [facility] [place] Heel Shin Test: EFL bilaterally 9 Hole Peg Test: left hand 27 seconds,  Right hand 41 seconds;  Box and Block L hand 52 blocks and  R hand 45 blocks (R hand slightly slower due to pt feeling a sense of heaviness in arm) Motor  Motor Motor: Within Functional Limits Motor - Skilled Clinical Observations: only motor deficits d/t R knee OA pain   Trunk/Postural Assessment  Cervical Assessment Cervical Assessment: Exceptions to [facility] (forrward head) Thoracic Assessment Thoracic Assessment: Within Functional Limits Lumbar Assessment Lumbar Assessment: Within Functional Limits Postural Control Postural Control: Within Functional Limits  Balance Standardized Balance Assessment Standardized Balance Assessment: Berg Balance Test;Functional Gait Assessment Berg Balance Test Sit to Stand: Able to stand without using hands and stabilize independently Standing Unsupported: Able to stand safely 2 minutes Sitting with Back Unsupported but Feet Supported on Floor or Stool: Able to sit safely and securely 2 minutes Stand to Sit: Sits safely with minimal use of hands Transfers: Able to transfer safely, minor use of hands Standing Unsupported with Eyes Closed: Able to stand 10 seconds safely Standing Ubsupported with Feet Together: Able to place feet together independently and stand 1 minute safely From Standing, Reach Forward with Outstretched Arm: Can reach confidently >25 cm (10") From Standing Position, Pick up Object from Floor: Able to pick up shoe safely and easily From Standing Position, Turn to Look Behind Over each Shoulder: Looks behind from both sides and weight shifts well Turn 360 Degrees: Able to turn 360 degrees safely one side only in 4 seconds or less Standing Unsupported, Alternately Place Feet on Step/Stool: Able to stand independently and safely and complete 8 steps in 20 seconds Standing Unsupported, One Foot in Front: Able to plae foot ahead of the other independently and hold 30 seconds Standing on One Leg: Able to lift leg independently and hold 5-10  seconds Total Score: 53 Dynamic Standing Balance Dynamic Standing - Level of Assistance: 7: Independent Functional Gait  Assessment Gait assessed : Yes Gait Level Surface: Walks 20 ft in less than 5.5 sec, no assistive devices, good speed, no evidence for imbalance, normal gait pattern, deviates no more than 6 in outside of the 12 in walkway width. Change in Gait Speed: Able to smoothly change walking speed without loss of balance or gait deviation. Deviate no more than 6 in outside of the 12 in walkway width. Gait with Horizontal Head Turns: Performs head turns smoothly with no change in gait. Deviates no more than 6 in outside 12 in walkway width Gait with Vertical Head Turns: Performs head turns with no change in gait. Deviates no more than 6 in outside 12 in walkway width. Gait and Pivot Turn: Pivot turns safely within 3 sec and stops quickly with no loss of balance. Step Over Obstacle: Is able to step over one shoe box (4.5 in total height) without changing gait speed. No evidence of imbalance. Gait with Narrow Base of Support: Is able to ambulate for 10 steps heel to toe with no staggering. Gait with Eyes Closed: Walks 20 ft, no assistive devices, good speed, no evidence of imbalance, normal gait pattern, deviates no more than 6 in outside 12 in walkway width. Ambulates 20 ft in less than 7 sec. Ambulating Backwards: Walks 20 ft, uses assistive device, slower speed, mild gait deviations, deviates 6-10 in outside 12 in walkway width. Steps: Alternating feet, no rail. Total Score: 28 FGA comment:: 28/ 30 = low fall risk Extremity Assessment  RUE Assessment RUE Assessment: Within Functional Limits General Strength Comments: R hand grip strength 107 lbs (vs 103 on L ),  R hand lateral pinch 22 lbs (vs 21 on L )  LUE Assessment LUE Assessment: Within Functional Limits RLE Assessment RLE Assessment: Within Functional Limits General Strength Comments: R knee extension limited only d/t R knee  OA pain LLE Assessment LLE Assessment: Within Functional Limits  Care Tool Care Tool Bed Mobility Roll left and right activity   Roll left and right assist level: Independent    Sit to lying activity   Sit to lying assist level: Independent    Lying to sitting on side of bed activity   Lying to sitting on side of bed assist level: the ability to move from lying on the back to sitting on the side of the bed with no back support.: Independent     Care Tool Transfers Sit to stand transfer   Sit to stand assist level: Independent    Chair/bed transfer   Chair/bed transfer assist level: Independent     Toilet transfer   Assist Level: Psychologist, sport and exercise transfer assist level: Independent      Care Tool Locomotion Ambulation   Assist level: Independent   Max distance: >36ft  Walk 10 feet activity   Assist level: Independent Assistive device: No Device   Walk 50 feet with 2 turns activity   Assist level: Independent Assistive device: No Device  Walk 150 feet activity   Assist level: Independent Assistive device: No Device  Walk 10 feet on uneven surfaces activity   Assist level: Independent    Stairs   Assist level: Independent Stairs assistive device: No device Max number of stairs: 12  Walk up/down 1 step activity   Walk up/down 1 step (curb) assist level: Independent    Walk up/down 4 steps activity   Walk up/down 4 steps assist level: Independent    Walk up/down 12 steps activity   Walk up/down 12 steps assist level: Independent    Pick up small objects from floor   Pick up small object from the floor assist level: Independent Pick up small object from the floor assistive device: n/a  Wheelchair Is the patient using a wheelchair?: No   Wheelchair activity did not occur: N/A      Wheel 50 feet with 2 turns activity Wheelchair 50 feet with 2 turns activity did not occur: N/A    Wheel 150 feet activity Wheelchair 150 feet activity did  not occur: N/A      Refer to Care Plan for Long Term Goals  SHORT TERM GOAL WEEK 1 PT Short Term Goal 1 (Week 1): none d/t no indication for IR stay  Recommendations for other services: None   Skilled Therapeutic Intervention Mobility Bed Mobility Bed Mobility: Sit to Supine;Supine to Sit Supine to Sit: Independent with assistive device Sit to Supine: Independent Transfers Transfers: Sit to Stand;Stand to Sit;Stand Pivot Transfers Sit to Stand: Independent Stand to Sit: Independent Stand Pivot Transfers: Independent Transfer (Assistive device): None Locomotion  Gait Ambulation: Yes Gait Assistance: Independent Gait Distance (Feet): 300 Feet Assistive device: None Gait Gait: Yes Gait Pattern: Antalgic High Level Ambulation High Level Ambulation: Side stepping;Backwards walking;Direction changes;Sudden stops;Head turns Side Stepping: WNL Backwards Walking: decreased step length but no LOB Direction Changes: can't pivot step d/t R knee OA pain but does turn and stop on command Sudden Stops: WNL Head Turns: no deviation from path Stairs / Additional Locomotion Stairs: Yes Stairs Assistance: Independent with assistive device;Independent Stair Management Technique: No rails;One rail Right Number of Stairs: 12 Height of Stairs: 6 Ramp: Independent Curb: Independent Naval architect Mobility: No  Discharge Criteria: Patient will be discharged from PT if patient refuses treatment 3 consecutive times without medical reason, if treatment goals not met, if there is a change in medical status, if patient makes no progress towards goals or if patient is discharged from hospital.  The above assessment, treatment plan, treatment alternatives and goals were discussed and mutually agreed upon: by patient and by family  Loel Dubonnet 04/25/2023, 1:14 PM

## 2023-04-25 NOTE — Progress Notes (Signed)
Inpatient Rehabilitation Care Coordinator Assessment and Plan Patient Details  Name: Ethan Payne MRN: [id number] Date of Birth: [birth year]  Today's Date: 04/25/2023  Hospital Problems: Principal Problem:   Acute embolic stroke [facility])  Past Medical History:  Past Medical History:  Diagnosis Date   Acute prostatitis 02/04/2008   ATRIAL MYXOMA 02/04/2008   Closed fracture of four ribs 11/01/2009   HYPERLIPIDEMIA 02/04/2008   HYPERTENSION 02/04/2008   Osteoarthritis of right knee 04/18/2010   Past Surgical History:  Past Surgical History:  Procedure Laterality Date   CATARACT EXTRACTION Left 08/03/2019   CORONARY ARTERY BYPASS GRAFT  2007   x2 Saph vein   COX-MAZE MICROWAVE ABLATION  2007   EXCISION OF ATRIAL MYXOMA  2007   Dr C. Cornelius Moras   Social History:  reports that he has never smoked. He has never used smokeless tobacco. He reports that he does not drink alcohol and does not use drugs.  Family / Support Systems Patient Roles: Spouse, Other (Comment) (Buisness) Spouse/Significant Other: Arther Abbott Children: Saverio Danker Other Supports: sister Anticipated Caregiver: spouse Ability/Limitations of Caregiver: none Caregiver Availability: 24/7 Family Dynamics: support from family  Social History Preferred language: English Religion:  Cultural Background: Independent and buisness owner Education: HS Health Literacy - How often do you need to have someone help you when you read instructions, pamphlets, or other written material from your doctor or pharmacy?: Rarely Writes: Yes Employment Status: Employed 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 Recent Psychosocial Issues: coping Psychiatric History: n/a Substance Abuse History: n/a  Patient / Family Perceptions, Expectations & Goals Pt/Family understanding of illness & functional limitations: yes Premorbid pt/family roles/activities: Independent overall Anticipated changes in roles/activities/participation: pt anticpates supervision from spouse and other family Pt/family expectations/goals: sup/mod i  Building surveyor: None Premorbid Home Care/DME Agencies: None Transportation available at discharge: spouse Is the patient able to respond to transportation needs?: Yes In the past 12 months, has lack of transportation kept you from medical appointments or from getting medications?: No In the past 12 months, has lack of transportation kept you from meetings, work, or from getting things needed for daily living?: No Resource referrals recommended: Neuropsychology  Discharge Planning Living Arrangements: Spouse/significant other Support Systems: Spouse/significant other, Other relatives Type of Residence: Private residence Insurance Resources: Media planner (specify) Financial Resources: Employment Financial Screen Referred: No Living Expenses: Own Money Management: Patient, Spouse Does the patient have any problems obtaining your medications?: No Home Management: Independent Patient/Family Preliminary Plans: Pt plans to manage with assist from spouse. Care Coordinator Barriers to Discharge: Insurance for SNF coverage Care Coordinator Anticipated Follow Up Needs: HH/OP Expected length of stay: 7-10  Clinical Impression Sw informed too high level for CIR. Pt will finish therapies today and ready for d/c tomorrow. Sw met with pt, sister  and family member at bedside. Sw introduced self, explained role and addressed questions and concerns. Pt and sister agreeable to d/c tomorrow. Patient OP PT recommendation and no DME. No additional questions or concerns.  Andria Rhein 04/25/2023, 12:18 PM

## 2023-04-25 NOTE — Discharge Summary (Signed)
Physician Discharge Summary  Patient ID: Ethan Payne MRN: [id number] DOB/AGE: 02-10-1946 77 y.o.  Admit date: 04/24/2023 Discharge date: 04/28/2023  Discharge Diagnoses:  Principal Problem:   Acute embolic stroke [facility]) Active problems: Functional deficits secondary to acute stroke Hypertension Hyperlipidemia Prediabetes pAF GERD OA right knee Right shoulder pain Chronic right rotator cuff tear Cervical spine stenosis  Discharged Condition: stable  Significant Diagnostic Studies:  Narrative & Impression  CLINICAL DATA:  Chronic intermittent right shoulder pain with overhead activity, worsened over the past week. No injury or prior surgery.   EXAM: MRI OF THE RIGHT SHOULDER WITHOUT CONTRAST   TECHNIQUE: Multiplanar, multisequence MR imaging of the shoulder was performed. No intravenous contrast was administered.   COMPARISON:  Right shoulder x-rays dated November 26, 2017.   FINDINGS: Rotator cuff: Chronic full-thickness, full width tears of the supraspinatus and infraspinatus tendons with retraction to the glenoid. Intact subscapularis tendon with severe distal tendinosis. The teres minor tendon is unremarkable.   Muscles:  Mild infraspinatus muscle atrophy.  No muscle edema.   Biceps long head: Moderate intra-articular tendinosis. Diminutive, somewhat ill-defined tendon within the bicipital groove, suspicious for high-grade partial tear.   Acromioclavicular Joint: Moderate arthropathy of the acromioclavicular joint. No significant subacromial/subdeltoid bursal fluid.   Glenohumeral Joint: No significant joint effusion. Mild diffuse cartilage thinning. Areas of partial-thickness cartilage loss over the humeral head.   Labrum: Diffusely degenerated. Torn superior and posterosuperior labrum.   Bones: High-riding humeral head. No acute fracture or dislocation. Reactive subcortical cystic change in the lesser tuberosity. No suspicious bone lesion.    Other: None.   IMPRESSION: 1. Chronic full-thickness, full width tears of the supraspinatus and infraspinatus tendons with retraction to the glenoid. Mild infraspinatus muscle atrophy. 2. Severe distal subscapularis tendinosis. 3. Moderate intra-articular biceps tendinosis. Diminutive, somewhat ill-defined biceps tendon within the bicipital groove, suspicious for high-grade partial tear. 4. Degenerated and torn superior and posterosuperior labrum. 5. Mild glenohumeral and moderate acromioclavicular osteoarthritis.     Electronically Signed   By: Obie Dredge M.D.   On: 04/28/2023 09:20      Narrative & Impression  CLINICAL DATA:  Cervical acute myelopathy. Intermittent right arm weakness and numbness.   EXAM: MRI CERVICAL SPINE WITHOUT CONTRAST   TECHNIQUE: Multiplanar, multisequence MR imaging of the cervical spine was performed. No intravenous contrast was administered.   COMPARISON:  None Available.   FINDINGS: Alignment: Facet mediated anterolisthesis at C7-T1 and T1-2, up to 3 mm at C7-T1.   Vertebrae: No fracture, evidence of discitis, or bone lesion.   Cord: Normal signal and morphology.   Posterior Fossa, vertebral arteries, paraspinal tissues: Negative.   Disc levels:   C2-3: Degenerative facet spurring which is moderate on the right. Patent canal and foramina   C3-4: Degenerative facet spurring and uncovertebral ridging eccentric to the right where there is moderate foraminal stenosis. Bulging disc and ligamentum flavum thickening mildly flattens the right posterior cord   C4-5: Endplate and facet spurring eccentric to the right. Circumferential disc bulging. Right foraminal narrowing is moderate. Left foraminal narrowing is mild   C5-6: Disc narrowing with left paracentral protrusion and buttressing osteophyte. Asymmetric left paracentral and uncovertebral ridging with left foraminal impingement. Degenerative facet spurring on both sides.  Right foraminal narrowing is moderate based on gradient images   C6-7: Disc narrowing and bulging with asymmetric right uncovertebral ridging. Moderate right foraminal narrowing.   C7-T1:Degenerative facet spurring with anterolisthesis. Disc height loss and mild bulging. No neural impingement   IMPRESSION:  1. Generalized cervical spine degeneration with mild anterolisthesis at C7-T1 and T1-2. 2. At least moderate foraminal stenosis on the right at C3-4 to C5-6. High-grade left foraminal stenosis at C4-5 and especially C5-6. 3. Up to mild spinal stenosis.  Normal cord signal abnormality.     Electronically Signed   By: Tiburcio Pea M.D.   On: 04/27/2023 05:09      CT HEAD CODE STROKE WO CONTRAST  Result Date: 04/25/2023 CLINICAL DATA:  Code stroke. Neuro deficit, acute, stroke suspected RUE weakness EXAM: CT HEAD WITHOUT CONTRAST TECHNIQUE: Contiguous axial images were obtained from the base of the skull through the vertex without intravenous contrast. RADIATION DOSE REDUCTION: This exam was performed according to the departmental dose-optimization program which includes automated exposure control, adjustment of the mA and/or kV according to patient size and/or use of iterative reconstruction technique. COMPARISON:  CT head Apr 21, 2023. FINDINGS: Brain: Known left perirolandic acute infarct better characterized on recent MRI. No evidence of interval acute large vascular territory infarction, hemorrhage, hydrocephalus, extra-axial collection or mass lesion/mass effect. Patchy white matter hypodensities, nonspecific but compatible with chronic microvascular ischemic disease. Vascular: No hyperdense vessel identified. Skull: No acute fracture. Sinuses/Orbits: Clear sinuses.  No acute orbital findings. Other: No mastoid effusions. ASPECTS [facility] Stroke Program Early CT Score) total score (0-10 with 10 being normal): 10 when measured for new/interval abnormality. IMPRESSION: 1. Known left  perirolandic acute infarct better characterized on recent MRI. 2. No evidence of acute large vascular territory infarct or acute hemorrhage. Code stroke imaging results were communicated on 04/25/2023 at 2:26 pm to provider Pearlean Brownie via telephone, who verbally acknowledged these results. Electronically Signed   By: Feliberto Harts M.D.   On: 04/25/2023 14:27    Labs:  Basic Metabolic Panel: Recent Labs  Lab 04/21/23 1800 04/21/23 1805 04/22/23 0204 04/25/23 0539 04/28/23 0433  NA 138 140 137 135 137  K 3.3* 3.3* 4.2 3.8 4.2  CL 103 103 102 102 102  CO2 25  --  27 25 27   GLUCOSE 119* 118* 121* 113* 111*  BUN 13 15 11 17 13   CREATININE 1.25* 1.30* 1.14 1.27* 1.18  CALCIUM 9.5  --  9.2 8.9 9.1  MG  --   --  2.0  --   --     CBC: Recent Labs  Lab 04/21/23 1800 04/21/23 1805 04/22/23 0204 04/28/23 0433  WBC 9.9  --  10.5 7.7  NEUTROABS 5.6  --  6.7  --   HGB 14.2 13.9 14.2 15.1  HCT 42.1 41.0 41.1 43.9  MCV 90.5  --  88.6 88.7  PLT 261  --  256 283    CBG: Recent Labs  Lab 04/21/23 1800 04/21/23 2020 04/22/23 0317 04/23/23 0717 04/25/23 1401  GLUCAP 131* 113* 123* 118* 136*    Brief HPI:   Ethan Payne is a 77 y.o. male  who presented to the [facility] ED on 04/21/2023 with acute right-sided weakness. Code stroke initiated. Head CT showed no acute process. Evaluated by Dr. Selina Cooley and risks, benefits, and alternatives to TNK were discussed with patient who gave informed consent to proceed after further discussion with his adult son. CTA showed no LVO therefore no intervention indicated. TNK administered at 18:23 hours. Admitted to ICU. On 5/21, right weakness had improved, right facial droop present, some ataxia continued in right arm, weaker grip right hand, right hand bilateral sensation intact. Blood pressure adequately controlled. 2D Echo: EF 55 to 60%, mild concentric LVH, trivial  MVR, trivial AVR, no evidence of left atrial mass, no shunt. LDL 219, HgbA1c 6.1%. VTE  prophylaxis - SCDs. On 5/22, the patient has reported right upper extremity proximal weakness due to rotator cuff tear and right leg weakness due to a "bad knee". Right grip remains weaker, mild right facial droop persists. MRI brain with multiple small foci of acute ischemia along the left precentral and postcentral gyri. No hemorrhage or mass effect. Old bilateral cerebellar small vessel infarcts and findings of chronic follows all ischemia. Started on aspirin and Plavix for 21 days followed by aspirin alone. Patient reports statin intolerance and is refusing further statin therapy Discussed Leqvio injections with patient pending insurance authorization. May need outpatient follow up with lipid clinic in the setting of hyperlipidemia and statin intolerance per Dr. Pearlean Brownie. The patient has impairment mobility needing up to mod assist for ambulation due to right LE deficits. He fatigues quickly, has poor safety awareness and appears to have some right inattention as well. He is tolerating regular diet. Norvasc and Zestril continue.    Hospital Course: Ethan Payne was admitted to rehab 04/24/2023 for inpatient therapies to consist of PT, ST and OT at least three hours five days a week. Past admission physiatrist, therapy team and rehab RN have worked together to provide customized collaborative inpatient rehab. 5/24: Follow-up labs with small increase in serum creatinine; BUN normal. He developed return of RUE weakness and facial droop and code stroke called. CT head without bleed. Likely worsening of recent deficit in setting of mild dehydration physical exertion. Given IVFs and rest from therapy. Encouraged PO fluids. VS and NIHSS continued x 2 hours. Due to transient right arm numbness and weakness, positive Jobe's test and history of rotator cuff tear, MRI of right shoulder obtained. Will consider shoulder injection and/or EMG at follow-up with PM&R. Cervical MRI has forminal stenosis C4-5 and C5-6. Given  Voltaren gel to try.   Blood pressures were monitored on TID basis and amlodipine 5 mg daily lisinopril 20 mg daily were continued. Reduced lisinopril to 10 mg. Reduced amlodipine to 2.5 mg for one day then increased back to 5 mg at discharge.   Prediabetes has been monitored with ac/hs CBG checks and carb modified diet.  Rehab course: During patient's stay in rehab weekly team conferences were held to monitor patient's progress, set goals and discuss barriers to discharge. At admission, patient was  independent  with functional mobility and with basic self-care skills.  He has had improvement in activity tolerance, balance, postural control as well as ability to compensate for deficits. He has had improvement in functional use RUE as well as improvement in awareness. He will follow-up with outpatient PT at [facility]  Discharge disposition: 01-Home or Self Care      Diet: carb modified  Special Instructions: No driving, alcohol consumption or tobacco use.  Stop Plavix on 05/14/2023 and continue aspirin 81 mg daily.  Follow-up with cardiology regarding 30 day heart monitor.  Recommend daily BP measurement in same arm and record time of day. Bring this information with you to follow-up appointment with PCP.  30-35 minutes were spent on discharge planning and discharge summary.   Discharge Instructions     Ambulatory referral to Cardiology   Complete by: As directed    30 day event monitor   Ambulatory referral to Neurology   Complete by: As directed    An appointment is requested in approximately: 4 weeks   Ambulatory referral to Physical Medicine Rehab  Complete by: As directed    Hospital follow-up and possible injection of right shoulder   Discharge patient   Complete by: As directed    Discharge disposition: 01-Home or Self Care   Discharge patient date: 04/28/2023      Allergies as of 04/28/2023       Reactions   Hydrocodone Palpitations   After rib fractures.  Hydrocodone-States had shortness of breath, 911 was called After rib fractures. Hydrocodone-States had shortness of breath, 911 was called   Loratadine Other (See Comments), Anxiety   Heart speeds up Other reaction(s): Other (See Comments) Heart speeds up        Medication List     STOP taking these medications    sildenafil 100 MG tablet Commonly known as: Viagra       TAKE these medications    acetaminophen 325 MG tablet Commonly known as: TYLENOL Take 1-2 tablets (325-650 mg total) by mouth every 4 (four) hours as needed for mild pain.   amLODipine 5 MG tablet Commonly known as: NORVASC Take 1 tablet (5 mg total) by mouth daily.   aspirin EC 81 MG tablet Take 1 tablet (81 mg total) by mouth daily. Swallow whole.   calcium carbonate 500 MG chewable tablet Commonly known as: TUMS - dosed in mg elemental calcium Chew 2 tablets by mouth at bedtime.   clopidogrel 75 MG tablet Commonly known as: PLAVIX Take 1 tablet (75 mg total) by mouth daily.   diclofenac Sodium 1 % Gel Commonly known as: VOLTAREN Apply 2 g topically 4 (four) times daily.   lisinopril 10 MG tablet Commonly known as: ZESTRIL Take 1 tablet (10 mg total) by mouth daily. Start taking on: Apr 29, 2023 What changed:  medication strength how much to take   Muscle Rub 10-15 % Crea Apply 1 Application topically 2 (two) times daily as needed for muscle pain.   rosuvastatin 40 MG tablet Commonly known as: CRESTOR Take 1 tablet (40 mg total) by mouth daily.        Follow-up Information     Shelva Majestic, MD Follow up.   Specialty: Family Medicine Why: Call the office in 1-2 days to make arrangements for hospital follow-up appointment. Contact information: [address] [place] [address] [phone]         Erick Colace, MD Follow up.   Specialty: Physical Medicine and Rehabilitation Why: office will call you to arrange your appt (sent) Contact  information: [address] [place] [address] [phone]         [facility] Follow up.   Why: Call the office in 1-2 days to make arrangements for hospital follow-up appointment. Contact information: [address]     [address] [place] [address] [phone]                Signed: Milinda Antis 04/28/2023, 10:18 AM

## 2023-04-25 NOTE — Progress Notes (Signed)
PA Dois Davenport notified this nurse of pt increased right arm/hand numbness and weakness. This nurse seen patient for assessment as soon as notified and ensured vitals were obtained. Code stroke ordered. And Stroke team at bedside. New orders received.  Mylo Red, LPN  [id number] return to baseline NIH score 0, vitals WNL  See assessment flowsheet for NIH scores and vitals on this shift

## 2023-04-25 NOTE — Progress Notes (Signed)
Patient ID: Ethan Payne, male   DOB: [birth year], 77 y.o.   MRN: [id number] Met with the patient and family to review current situation, rehab process, team conference/pop conference and plan of care. Discussed SVD and stroke location with expected symptoms and BE-FAST.  Reviewed secondary risks including HTN on sestril and Norvasc, HLD; statin intolerance, information sent to insurance for Leqvio and prediabetes. Reviewed medications including DAPT x 3 weeks then ASA mono therapy with dietary modification tips for HH diet. Has access to My Chart and understands follow up with neurology after discharge. Continue to follow along to address educational needs to facilitate preparation for discharge. Pamelia Hoit

## 2023-04-25 NOTE — Progress Notes (Signed)
Elevated creatinine noted. Encouraged pt to drink P.O fluids. Fresh water given to patient. Mylo Red, LPN

## 2023-04-26 MED ORDER — AMLODIPINE BESYLATE 2.5 MG PO TABS
2.5000 mg | ORAL_TABLET | Freq: Every day | ORAL | Status: DC
  Administered 2023-04-26: 2.5 mg via ORAL
  Filled 2023-04-26: qty 1

## 2023-04-26 MED ORDER — SODIUM CHLORIDE 0.9 % IV SOLN: INTRAVENOUS | Status: DC

## 2023-04-26 MED ORDER — LISINOPRIL 5 MG PO TABS
5.0000 mg | ORAL_TABLET | Freq: Every day | ORAL | Status: DC
  Administered 2023-04-26: 5 mg via ORAL
  Filled 2023-04-26: qty 1

## 2023-04-26 NOTE — Progress Notes (Addendum)
PROGRESS NOTE   Subjective/Complaints: Appreciate neuro note  Had 2x  episodes of transient left arm weakness and numbness last noc , BP at that time systolic was one yesterday afternoon after lunch and am therapies and one at midnight  Reviewed repeat CT and CT angio, no sig intracranial stenosis, also note is made of cervical stenosis Pt states he has felt anxious regarding bed alarm, IV, RN noted anxiety relieved with attending to pt's care needs (felt better after washing face with wash cloth)  ROS- neg CP, SOB, N/V/D Objective:   CT HEAD CODE STROKE WO CONTRAST  Result Date: 04/25/2023 CLINICAL DATA:  Code stroke. Neuro deficit, acute, stroke suspected RUE weakness EXAM: CT HEAD WITHOUT CONTRAST TECHNIQUE: Contiguous axial images were obtained from the base of the skull through the vertex without intravenous contrast. RADIATION DOSE REDUCTION: This exam was performed according to the departmental dose-optimization program which includes automated exposure control, adjustment of the mA and/or kV according to patient size and/or use of iterative reconstruction technique. COMPARISON:  CT head Apr 21, 2023. FINDINGS: Brain: Known left perirolandic acute infarct better characterized on recent MRI. No evidence of interval acute large vascular territory infarction, hemorrhage, hydrocephalus, extra-axial collection or mass lesion/mass effect. Patchy white matter hypodensities, nonspecific but compatible with chronic microvascular ischemic disease. Vascular: No hyperdense vessel identified. Skull: No acute fracture. Sinuses/Orbits: Clear sinuses.  No acute orbital findings. Other: No mastoid effusions. ASPECTS [facility] Stroke Program Early CT Score) total score (0-10 with 10 being normal): 10 when measured for new/interval abnormality. IMPRESSION: 1. Known left perirolandic acute infarct better characterized on recent MRI. 2. No evidence  of acute large vascular territory infarct or acute hemorrhage. Code stroke imaging results were communicated on 04/25/2023 at 2:26 pm to provider Pearlean Brownie via telephone, who verbally acknowledged these results. Electronically Signed   By: Feliberto Harts M.D.   On: 04/25/2023 14:27   No results for input(s): "WBC", "HGB", "HCT", "PLT" in the last 72 hours. Recent Labs    04/25/23 0539  NA 135  K 3.8  CL 102  CO2 25  GLUCOSE 113*  BUN 17  CREATININE 1.27*  CALCIUM 8.9     Intake/Output Summary (Last 24 hours) at 04/26/2023 8295 Last data filed at 04/25/2023 1736 Gross per 24 hour  Intake 933.73 ml  Output --  Net 933.73 ml         Physical Exam: Vital Signs Blood pressure (!) 169/95, pulse 87, temperature 97.9 F (36.6 C), temperature source Oral, resp. rate 16, height 5\' 9"  (1.753 m), weight 75 kg, SpO2 99 %.  HOH , ? Mild anomia  General: No acute distress Mood and affect are appropriate Heart: Regular rate and rhythm no rubs murmurs or extra sounds Lungs: Clear to auscultation, breathing unlabored, no rales or wheezes Abdomen: Positive bowel sounds, soft nontender to palpation, nondistended Extremities: No clubbing, cyanosis, or edema Skin: No evidence of breakdown, no evidence of rash Neurologic: Cranial nerves II through XII intact, motor strength is 5/5 in bilateral deltoid, bicep, tricep, 4/5 R and 5/5 Left grip, 5/5 B hip flexor, knee extensors, ankle dorsiflexor and plantar flexor Sensory exam normal sensation to light  touch a in bilateral upper and lower extremities Finger to thumb opposition intact, neg dysdiadochokinesis RAM BUE  Cerebellar no evidence of dysmetria FNF  Musculoskeletal: Full range of motion in all 4 extremities. No joint swelling Neg impingement RIght shoulder full ROM without pain  Neuro:   Cranial nerves II- Visual fields are intact to confrontation testing, no blurring of vision III- no evidence of ptosis, upward, downward and medial gaze  intact IV- no vertical diplopia or head tilt V- no facial numbness or masseter weakness VI- no pupil abduction weakness VII- no facial droop, good lid closure VII- normal auditory acuity IX- no pharygeal weakness, gag nl X- no pharyngeal weakness, no hoarseness XI- no trap or SCM weakness XII- no glossal weakness    Assessment/Plan: 1. Functional deficits which require 3+ hours per day of interdisciplinary therapy in a comprehensive inpatient rehab setting. Physiatrist is providing close team supervision and 24 hour management of active medical problems listed below. Physiatrist and rehab team continue to assess barriers to discharge/monitor patient progress toward functional and medical goals  Care Tool:  Bathing    Body parts bathed by patient: Right arm, Left arm, Chest, Abdomen, Front perineal area, Buttocks, Right upper leg, Left upper leg, Face, Left lower leg, Right lower leg         Bathing assist Assist Level: Independent     Upper Body Dressing/Undressing Upper body dressing   What is the patient wearing?: Pull over shirt    Upper body assist Assist Level: Independent    Lower Body Dressing/Undressing Lower body dressing      What is the patient wearing?: Underwear/pull up, Pants     Lower body assist Assist for lower body dressing: Independent     Toileting Toileting    Toileting assist Assist for toileting: Independent     Transfers Chair/bed transfer  Transfers assist     Chair/bed transfer assist level: Independent     Locomotion Ambulation   Ambulation assist      Assist level: Independent   Max distance: >350ft   Walk 10 feet activity   Assist     Assist level: Independent Assistive device: No Device   Walk 50 feet activity   Assist    Assist level: Independent Assistive device: No Device    Walk 150 feet activity   Assist    Assist level: Independent Assistive device: No Device    Walk 10 feet on  uneven surface  activity   Assist     Assist level: Independent     Wheelchair     Assist Is the patient using a wheelchair?: No   Wheelchair activity did not occur: N/A         Wheelchair 50 feet with 2 turns activity    Assist    Wheelchair 50 feet with 2 turns activity did not occur: N/A       Wheelchair 150 feet activity     Assist  Wheelchair 150 feet activity did not occur: N/A       Blood pressure (!) 169/95, pulse 87, temperature 97.9 F (36.6 C), temperature source Oral, resp. rate 16, height 5\' 9"  (1.753 m), weight 75 kg, SpO2 99 %.  Medical Problem List and Plan: 1. Functional deficits secondary to multiple L brain stroke/infarcts- precentral and post central gyri             -patient may  shower             -ELOS/Goals: original d/c  date today but given episodes of transient arm numbness and increased weakness will cont PT, OT Check Cervical MRI given stenosis observed on CT angio  CHeck ortho BPs Reduce lisinopril and amlodipine to allow BP to run higher IVF changed to 7p-7a    2.  Antithrombotics: -DVT/anticoagulation:  Pharmaceutical: Lovenox>>start 40 mg daily             -antiplatelet therapy: Aspirin and Plavix for three weeks followed by aspirin alone (started 5/22)   3. Pain Management: Tylenol as needed RIght shoulder exam unremarkable will monitor in therapy    4. Mood/Behavior/Sleep: LCSW to evaluate and provide emotional support             -antipsychotic agents: n/a May benefit from anxiety medication but will cont to monitor for now    5. Neuropsych/cognition: This patient is capable of making decisions on his own behalf.   6. Skin/Wound Care: Routine skin care checks   7. Fluids/Electrolytes/Nutrition: Routine Is and Os and follow-up chemistries   8: Hypertension: monitor TID and prn (home Zestoretic 20/12.5 daily)             -continue amlodipine 5 mg daily- reduce to 2.5mg              -continue lisinopril 20 mg  daily reduce to 5mg  Vitals:   04/26/23 0201 04/26/23 0607  BP: (!) 145/81 (!) 169/95  Pulse: 74 87  Resp: 19 16  Temp: 98.5 F (36.9 C) 97.9 F (36.6 C)  SpO2: 98% 99%   Good control , monitor with activity    9: Hyperlipidemia: "Discussed Leqvio injection with patient and family pending insurance authorization. Form faxed to Leqvio. May need outpatient follow up with lipid clinic in the setting of hyperlipidemia and statin intolerance." >> per Dr. 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              -consider 30 day event monitor per neurology   12: GERD: s/p esophageal dil in 2023: continue Protonix   13: OA right knee- getting home R knee brace, add aspercreme, consider injection if needed     LOS: 2 days A FACE TO FACE EVALUATION WAS PERFORMED  Erick Colace 04/26/2023, 6:52 AM

## 2023-04-26 NOTE — Progress Notes (Signed)
This nurse went into pt's room to obtain his VS for his q 2 hours check. Pt and his wife stated that he had another episode just like he did earlier that day. He states that his R arm went numb and his skin felt like "leather". States that he couldn't move his R arm for about 20 minutes, but then it faded away. He currently felt fine besides the lingering numbness feeling on his fingers, but he wanted to tell someone so they could know that it happened again. Wife and pt understands the logic of what could be the reasoning for this to happen per the provider's note, but are also concerned that it could be something else. Pt's nurse was updated on what occurred. Will continue to monitor throughout night.

## 2023-04-26 NOTE — Progress Notes (Signed)
Patient vitals taken at 2am. Denies any previous episodes from happening at this time. Advised to call nurse if it occurs again. Will continue to monitor.

## 2023-04-26 NOTE — Progress Notes (Signed)
Occupational Therapy Note  Patient Details  Name: Ethan Payne MRN: [id number] Date of Birth: 03/27/1946  Today's Date: 04/26/2023 OT Individual Time: 1045-1100 OT Individual Time Calculation (min): 15 min for Re-evaluation   Pt received a 75 minute evaluation yesterday and pt demonstrated no deficits OT needed to address. He stated he had a heaviness in his R hand and arm and this resulted in him taking 5 seconds longer to complete the 9 hole peg test with his R hand then it did with his L hand.  His grasp strength was 107 lbs yesterday.    No LTGs had been set and discharge home was planned for this morning. Pt had 2 episodes yesterday of transient increased R arm and leg numbness.  Medical team is evaluating pt.  New OT orders put in this am for evaluation.  Pt received in his room with his wife, son and sister.  Pt in good spirits and stated he felt back to normal except for the heaviness in his R arm and leg.  Pt agreeable to a brief reassessment.  RUE AROM WFL R shoulder 4+/5 (history of R rotator cuff repair) R grasp strength 102 lbs Visual tracking and visual fields intact Sit to stand from low recliner and toilet independent Ambulation in room independent FMC to fasten and unfasten button and zipper on jeans independent Toileting independent Pt and wife stated he dressed himself today  Pt and wife do not have any new concerns with his function but are concerned about these recent 2 episodes and waiting on further evaluation.  Reviewed recommendation for outpt PT for high level balance due to the nature of his vocation, to wait to return to work and driving until medically cleared by MD.   From a functional standpoint, pt continues to be safe to be independent in the room.  Nursing prefers for him to have supervision due to fluctuating medical status and concerns about possible change in function.   At this time, pt continued to not need OT services.     Ethan Payne 04/26/2023, 12:03 PM

## 2023-04-26 NOTE — Progress Notes (Incomplete)
Physical Therapy Note  Patient Details  Name: Ethan Payne MRN: [id number] Date of Birth: 1946/09/04 Today's Date: 04/26/2023 PT Individual Time:  4098-1191  PT Individual Time Calculation (min): 58 min   Re-evaluation assessment performed per MD orders.  Following initial evaluation yesterday, pt experiences 2 additional episodes of increasing L arm weakness and numbness. Pt had been deemed not appropriate for CIR services but d/t episodes, MD expresses desire for pt to hav therapy sessions until anticipated d/c on Tuesday 5/28 pending further medical workup. Pt seen today for re-evaluation, however, pt no longer experiencing increased weakness/ numbness. Pt again demos high level functioning with minimal balance issues. Any impairments noted in antalgic gait are mainly d/t pt's chronic R knee OA.   6 Min Walk Test:  Instructed patient to ambulate as quickly and as safely as possible for 6 minutes using LRAD. Patient was allowed to take standing rest breaks without stopping the test, but if the patient required a sitting rest break the clock would be stopped and the test would be over.  Results: 974 feet (297 meters, Avg speed 0.889m/s) using no AD with supervision. Results indicate that the patient does not demonstrate reduced endurance with ambulation compared to age matched norms.  Age Matched Norms: 89-69 yo M: 23 F: 18, 34-79 yo M: 56 F: 471, 26-89 yo M: 417 F: 392 MDC: 58.21 meters (190.98 feet) or 50 meters (ANPTA Core Set of Outcome Measures for Adults with Neurologic Conditions, 2018)   Pt also guided in assessment of balance with alternating cone toe tapping and foot circling with minimal balance impairments - any issues noted, again, more d/t chronic R OA pain with no neurological impairment at time of evaluation. No indication for neuro specific therapy at this time s/p stroke.   Pt has family members with him throughout visiting hours each day providing supervision.   Pt  cannot only be seen by only one therapeutic discipline and so can not be seen by PT at this time for OA pain mgmt. Pt will need to continue movement each day and will continue to be Mod I in room. Also recommended for family to ambulate next to patient with walks throughout the department several times each day in order to maintain activity tolerance and joint mobility. Educated available family. Both nurses and nurse techs for [employer] notified re: need for continued mobility with family members each day.   Request to MD for k-pad for cryotherapy to pt's knee as well as use of voltaren gel for add'l pain mgmt.   Loel Dubonnet PT, DPT, CSRS  04/26/2023, 8:33 PM

## 2023-04-26 NOTE — Progress Notes (Signed)
Inpatient Rehabilitation Discharge Medication Review by a Pharmacist   A complete drug regimen review was completed for this patient to identify any potential clinically significant medication issues.   High Risk Drug Classes Is patient taking? Indication by Medication  Antipsychotic No    Anticoagulant No    Antibiotic No    Opioid No    Antiplatelet Yes ASA, plavix - Stroke PPX  Hypoglycemics/insulin No    Vasoactive Medication Yes Amlodipine, lisinopril - HTN  Chemotherapy No    Other Yes Crestor - HLD Protonix - GERD        Type of Medication Issue Identified Description of Issue Recommendation(s)  Drug Interaction(s) (clinically significant)        Duplicate Therapy        Allergy        No Medication Administration End Date        Incorrect Dose        Additional Drug Therapy Needed        Significant med changes from prior encounter (inform family/care partners about these prior to discharge). ASA 81 mg + Plavix 75 mg for 21 days, then aspirin alone. First doses given 04/23/23.   PTA lisinopril-HCTZ 20-12.5 mg. Off HCTZ component in CIR. Counsel patient to stop Plavix after 05/13/23.      Resume HCTZ component on discharge if clinically warranted.  Other   Was refusing rosuvastatin during acute care stay due to reported intolerance, but received a dose in CIR. Stroke team notes discussed Leqvio injections pending insurance authorization. Noted may need follow up with [facility] as outpatient.      Clinically significant medication issues were identified that warrant physician communication and completion of prescribed/recommended actions by midnight of the next day:  No   Time spent performing this drug regimen review (minutes):  20   Thank you for involving pharmacy in this patient's care.    Rockwell Alexandria, PharmD PGY1 Pharmacy Resident 04/26/2023 7:16 AM

## 2023-04-26 NOTE — Progress Notes (Signed)
   04/26/23 0728 04/26/23 0731 04/26/23 0735  Vitals  BP (!) 162/84 (!) 148/88 (!) 152/99  MAP (mmHg) 108 107 114  BP Location Left Arm Left Arm Left Arm  BP Method Automatic Automatic Automatic  Patient Position (if appropriate) Lying Sitting Standing  Pulse Rate 80 85 93

## 2023-04-26 NOTE — Progress Notes (Signed)
Pt was made independent in room per therapy yesterday. With given neuro changes and intermittent right side weakness, pt alarms placed and encouraged pt to call for assistance to prevent falls. Pt sitting in chair, call light in reach and chair alarm in place. Wife at bedside.  Mylo Red, LPN

## 2023-04-27 ENCOUNTER — Inpatient Hospital Stay (HOSPITAL_COMMUNITY): Payer: Medicare Other

## 2023-04-27 MED ORDER — DICLOFENAC SODIUM 1 % EX GEL
2.0000 g | Freq: Four times a day (QID) | CUTANEOUS | Status: DC
  Administered 2023-04-27 – 2023-04-28 (×4): 2 g via TOPICAL
  Filled 2023-04-27: qty 100

## 2023-04-27 MED ORDER — LISINOPRIL 5 MG PO TABS
10.0000 mg | ORAL_TABLET | Freq: Every day | ORAL | Status: DC
  Administered 2023-04-28: 10 mg via ORAL
  Filled 2023-04-27: qty 2

## 2023-04-27 MED ORDER — AMLODIPINE BESYLATE 5 MG PO TABS
5.0000 mg | ORAL_TABLET | Freq: Every day | ORAL | Status: DC
  Administered 2023-04-28: 5 mg via ORAL
  Filled 2023-04-27: qty 1

## 2023-04-27 NOTE — Progress Notes (Addendum)
PROGRESS NOTE   Subjective/Complaints:  No episode of weakness overnite , BPs have been running higher with reduction of BP meds, IVF last noc now disconnected Discussed result of C spine MRI  Pain with overhead activity on and off for months but increased pain x 1 wk, no discrete injury , pt wonders if this is contributing to his problems on the right side  ROS- neg CP, SOB, N/V/D Objective:   MR CERVICAL SPINE WO CONTRAST  Result Date: 04/27/2023 CLINICAL DATA:  Cervical acute myelopathy. Intermittent right arm weakness and numbness. EXAM: MRI CERVICAL SPINE WITHOUT CONTRAST TECHNIQUE: Multiplanar, multisequence MR imaging of the cervical spine was performed. No intravenous contrast was administered. COMPARISON:  None Available. FINDINGS: Alignment: Facet mediated anterolisthesis at C7-T1 and T1-2, up to 3 mm at C7-T1. Vertebrae: No fracture, evidence of discitis, or bone lesion. Cord: Normal signal and morphology. Posterior Fossa, vertebral arteries, paraspinal tissues: Negative. Disc levels: C2-3: Degenerative facet spurring which is moderate on the right. Patent canal and foramina C3-4: Degenerative facet spurring and uncovertebral ridging eccentric to the right where there is moderate foraminal stenosis. Bulging disc and ligamentum flavum thickening mildly flattens the right posterior cord C4-5: Endplate and facet spurring eccentric to the right. Circumferential disc bulging. Right foraminal narrowing is moderate. Left foraminal narrowing is mild C5-6: Disc narrowing with left paracentral protrusion and buttressing osteophyte. Asymmetric left paracentral and uncovertebral ridging with left foraminal impingement. Degenerative facet spurring on both sides. Right foraminal narrowing is moderate based on gradient images C6-7: Disc narrowing and bulging with asymmetric right uncovertebral ridging. Moderate right foraminal narrowing.  C7-T1:Degenerative facet spurring with anterolisthesis. Disc height loss and mild bulging. No neural impingement IMPRESSION: 1. Generalized cervical spine degeneration with mild anterolisthesis at C7-T1 and T1-2. 2. At least moderate foraminal stenosis on the right at C3-4 to C5-6. High-grade left foraminal stenosis at C4-5 and especially C5-6. 3. Up to mild spinal stenosis.  Normal cord signal abnormality. Electronically Signed   By: Tiburcio Pea M.D.   On: 04/27/2023 05:09   CT HEAD CODE STROKE WO CONTRAST  Result Date: 04/25/2023 CLINICAL DATA:  Code stroke. Neuro deficit, acute, stroke suspected RUE weakness EXAM: CT HEAD WITHOUT CONTRAST TECHNIQUE: Contiguous axial images were obtained from the base of the skull through the vertex without intravenous contrast. RADIATION DOSE REDUCTION: This exam was performed according to the departmental dose-optimization program which includes automated exposure control, adjustment of the mA and/or kV according to patient size and/or use of iterative reconstruction technique. COMPARISON:  CT head Apr 21, 2023. FINDINGS: Brain: Known left perirolandic acute infarct better characterized on recent MRI. No evidence of interval acute large vascular territory infarction, hemorrhage, hydrocephalus, extra-axial collection or mass lesion/mass effect. Patchy white matter hypodensities, nonspecific but compatible with chronic microvascular ischemic disease. Vascular: No hyperdense vessel identified. Skull: No acute fracture. Sinuses/Orbits: Clear sinuses.  No acute orbital findings. Other: No mastoid effusions. ASPECTS [facility] Stroke Program Early CT Score) total score (0-10 with 10 being normal): 10 when measured for new/interval abnormality. IMPRESSION: 1. Known left perirolandic acute infarct better characterized on recent MRI. 2. No evidence of acute large vascular territory infarct or acute hemorrhage. Code stroke  imaging results were communicated on 04/25/2023 at 2:26 pm to  provider Pearlean Brownie via telephone, who verbally acknowledged these results. Electronically Signed   By: Feliberto Harts M.D.   On: 04/25/2023 14:27   No results for input(s): "WBC", "HGB", "HCT", "PLT" in the last 72 hours. Recent Labs    04/25/23 0539  NA 135  K 3.8  CL 102  CO2 25  GLUCOSE 113*  BUN 17  CREATININE 1.27*  CALCIUM 8.9     Intake/Output Summary (Last 24 hours) at 04/27/2023 0709 Last data filed at 04/26/2023 1900 Gross per 24 hour  Intake 1080 ml  Output --  Net 1080 ml         Physical Exam: Vital Signs Blood pressure (!) 142/85, pulse 73, temperature 98.6 F (37 C), temperature source Oral, resp. rate 18, height 5\' 9"  (1.753 m), weight 75 kg, SpO2 98 %.  HOH , ? Mild anomia  General: No acute distress Mood and affect are appropriate Heart: Regular rate and rhythm no rubs murmurs or extra sounds Lungs: Clear to auscultation, breathing unlabored, no rales or wheezes Abdomen: Positive bowel sounds, soft nontender to palpation, nondistended Extremities: No clubbing, cyanosis, or edema Skin: No evidence of breakdown, no evidence of rash Neurologic: Cranial nerves II through XII intact, motor strength is 5/5 in bilateral deltoid, bicep, tricep, 4+/5 R and 5/5 Left grip, 5/5 B hip flexor, knee extensors, ankle dorsiflexor and plantar flexor Sensory exam normal sensation to light touch a in bilateral upper and lower extremities Finger to thumb opposition intact, neg dysdiadochokinesis RAM BUE  Cerebellar no evidence of dysmetria FNF  Musculoskeletal: Full range of motion in all 4 extremities. No joint swelling Neg impingement RIght shoulder full ROM without pain  Neuro:     Assessment/Plan: 1. Functional deficits which require 3+ hours per day of interdisciplinary therapy in a comprehensive inpatient rehab setting. Physiatrist is providing close team supervision and 24 hour management of active medical problems listed below. Physiatrist and rehab team  continue to assess barriers to discharge/monitor patient progress toward functional and medical goals  Care Tool:  Bathing    Body parts bathed by patient: Right arm, Left arm, Chest, Abdomen, Front perineal area, Buttocks, Right upper leg, Left upper leg, Face, Left lower leg, Right lower leg         Bathing assist Assist Level: Independent     Upper Body Dressing/Undressing Upper body dressing   What is the patient wearing?: Pull over shirt    Upper body assist Assist Level: Independent    Lower Body Dressing/Undressing Lower body dressing      What is the patient wearing?: Underwear/pull up, Pants     Lower body assist Assist for lower body dressing: Independent     Toileting Toileting    Toileting assist Assist for toileting: Independent     Transfers Chair/bed transfer  Transfers assist     Chair/bed transfer assist level: Independent     Locomotion Ambulation   Ambulation assist      Assist level: Independent   Max distance: >36ft   Walk 10 feet activity   Assist     Assist level: Independent Assistive device: No Device   Walk 50 feet activity   Assist    Assist level: Independent Assistive device: No Device    Walk 150 feet activity   Assist    Assist level: Independent Assistive device: No Device    Walk 10 feet on uneven surface  activity   Assist  Assist level: Independent     Wheelchair     Assist Is the patient using a wheelchair?: No   Wheelchair activity did not occur: N/A         Wheelchair 50 feet with 2 turns activity    Assist    Wheelchair 50 feet with 2 turns activity did not occur: N/A       Wheelchair 150 feet activity     Assist  Wheelchair 150 feet activity did not occur: N/A       Blood pressure (!) 142/85, pulse 73, temperature 98.6 F (37 C), temperature source Oral, resp. rate 18, height 5\' 9"  (1.753 m), weight 75 kg, SpO2 98 %.  Medical Problem List and  Plan: 1. Functional deficits secondary to multiple L brain stroke/infarcts- precentral and post central gyri             -patient may  shower             -ELOS/Goals: original d/c date today but given episodes of transient arm numbness and increased weakness will cont PT, OT Cervical MRI has forminal stenosis Bilateral C4-5 and C5-6  CHeck ortho BPs DC IVF, BPs higher ,will increase lisinopril and amlodipine , monitor ortho stasis and neuro changes, if stable can d/c in am    2.  Antithrombotics: -DVT/anticoagulation:  Pharmaceutical: Lovenox>>start 40 mg daily             -antiplatelet therapy: Aspirin and Plavix for three weeks followed by aspirin alone (started 5/22)   3. Pain Management: Tylenol as needed RIght shoulder exam unremarkable will monitor in therapy  [place]oulder pain with overhead activity , ? Rotator cuff tear which may be contributing to symptoms - check MRI Right shoulder  If no significant findings may need Outpt EMG with me in 4 wks   4. Mood/Behavior/Sleep: LCSW to evaluate and provide emotional support             -antipsychotic agents: n/a May benefit from anxiety medication but will cont to monitor for now    5. Neuropsych/cognition: This patient is capable of making decisions on his own behalf.   6. Skin/Wound Care: Routine skin care checks   7. Fluids/Electrolytes/Nutrition: Routine Is and Os and follow-up chemistries Elevated Creat recheck BMP in am  May d/c IVF cont  to enc po    8: Hypertension: monitor TID and prn (home Zestoretic 20/12.5 daily)             -continue amlodipine 5 mg daily- reduce to 2.5mg              -continue lisinopril 20 mg daily reduce to 5mg  Vitals:   04/26/23 2356 04/27/23 0404  BP: (!) 166/78 (!) 142/85  Pulse: 75 73  Resp: 18 18  Temp: 97.9 F (36.6 C) 98.6 F (37 C)  SpO2: 99% 98%   Orthostatic vitals look ok, now hypertensive, will increase amlodipine to 5mg  and Lisinopril to 10mg  ,    9: Hyperlipidemia: "Discussed  Leqvio injection with patient and family pending insurance authorization. Form faxed to Le[facility]. May need outpatient follow up with lipid clinic in the setting of hyperlipidemia and statin intolerance." >> per Dr. Pearlean Brownie             -Crestor is on current medication admin list>>continue (d/c summary not yet completed)   10: Prediabetes: carb modified diet; plasma/CBGs ~120s             -CBGs BID and monitor (no meds at  home or this admission)              -follow-up PCP   11: pAF: >10 yrs ago which resolved after resection of cardiac myxoma in 2009.              -consider 30 day event monitor per neurology, will ask cardiology to place 5/26   12: GERD: s/p esophageal dil in 2023: continue Protonix   13: OA right knee- getting home R knee brace, add aspercreme, consider injection if needed     LOS: 3 days A FACE TO FACE EVALUATION WAS PERFORMED  Erick Colace 04/27/2023, 7:09 AM

## 2023-04-27 NOTE — Progress Notes (Signed)
MRI department notified for reassurance of xray being doe tonight, spoke with Morrie Sheldon states several stats and other emergency xray to e done , Will probably be early morning, patient and wife notified , Reassurance provided, so that patient can get some needed sleep  0600 Patient transported to MRI-Shoulder and return back to the department,No apparent distress or discomfort verbalized or noted. Continue regime, wife remains at bedside

## 2023-04-28 ENCOUNTER — Inpatient Hospital Stay (HOSPITAL_COMMUNITY): Payer: Medicare Other

## 2023-04-28 DIAGNOSIS — I10 Essential (primary) hypertension: Secondary | ICD-10-CM

## 2023-04-28 DIAGNOSIS — M75101 Unspecified rotator cuff tear or rupture of right shoulder, not specified as traumatic: Secondary | ICD-10-CM

## 2023-04-28 DIAGNOSIS — I480 Paroxysmal atrial fibrillation: Secondary | ICD-10-CM

## 2023-04-28 LAB — BASIC METABOLIC PANEL WITH GFR
Anion gap: 8 (ref 5–15)
BUN: 13 mg/dL (ref 8–23)
CO2: 27 mmol/L (ref 22–32)
Calcium: 9.1 mg/dL (ref 8.9–10.3)
Chloride: 102 mmol/L (ref 98–111)
Creatinine, Ser: 1.18 mg/dL (ref 0.61–1.24)
GFR, Estimated: >60 mL/min
Glucose, Bld: 111 mg/dL — ABNORMAL HIGH (ref 70–99)
Potassium: 4.2 mmol/L (ref 3.5–5.1)
Sodium: 137 mmol/L (ref 135–145)

## 2023-04-28 LAB — CBC
HCT: 43.9 % (ref 39.0–52.0)
Hemoglobin: 15.1 g/dL (ref 13.0–17.0)
MCH: 30.5 pg (ref 26.0–34.0)
MCHC: 34.4 g/dL (ref 30.0–36.0)
MCV: 88.7 fL (ref 80.0–100.0)
Platelets: 283 K/uL (ref 150–400)
RBC: 4.95 MIL/uL (ref 4.22–5.81)
RDW: 12.5 % (ref 11.5–15.5)
WBC: 7.7 K/uL (ref 4.0–10.5)
nRBC: 0 % (ref 0.0–0.2)

## 2023-04-28 MED ORDER — ACETAMINOPHEN 325 MG PO TABS: 325.0000 mg | ORAL_TABLET | ORAL | Status: DC | PRN

## 2023-04-28 MED ORDER — ROSUVASTATIN CALCIUM 40 MG PO TABS: 40.0000 mg | ORAL_TABLET | Freq: Every day | ORAL | 0 refills | Status: DC

## 2023-04-28 MED ORDER — DICLOFENAC SODIUM 1 % EX GEL: 2.0000 g | Freq: Four times a day (QID) | CUTANEOUS | Status: DC

## 2023-04-28 MED ORDER — LISINOPRIL 10 MG PO TABS: 10.0000 mg | ORAL_TABLET | Freq: Every day | ORAL | 0 refills | Status: DC

## 2023-04-28 MED ORDER — CLOPIDOGREL BISULFATE 75 MG PO TABS: 75.0000 mg | ORAL_TABLET | Freq: Every day | ORAL | 0 refills | Status: DC

## 2023-04-28 MED ORDER — ASPIRIN 81 MG PO TBEC: 81.0000 mg | DELAYED_RELEASE_TABLET | Freq: Every day | ORAL | 0 refills | Status: DC

## 2023-04-28 MED ORDER — MUSCLE RUB 10-15 % EX CREA: 1.0000 | TOPICAL_CREAM | Freq: Two times a day (BID) | CUTANEOUS | 0 refills | Status: DC | PRN

## 2023-04-28 NOTE — Progress Notes (Signed)
Wendi Maya, PA provided discharge instructions, prescriptions sent to pharmacy of choice by patient.  Staff assisted patient off the unit. Patient discharged via private car with family.    Tilden Dome, LPN

## 2023-04-28 NOTE — Progress Notes (Signed)
Inpatient Rehabilitation Discharge Medication Review by a Pharmacist   A complete drug regimen review was completed for this patient to identify any potential clinically significant medication issues.   High Risk Drug Classes Is patient taking? Indication by Medication  Antipsychotic No    Anticoagulant No    Antibiotic No    Opioid No    Antiplatelet Yes ASA, plavix - Stroke PPX  Hypoglycemics/insulin No    Vasoactive Medication Yes Amlodipine, lisinopril - HTN  Chemotherapy No    Other Yes Crestor - HLD        Type of Medication Issue Identified Description of Issue Recommendation(s)  Drug Interaction(s) (clinically significant)        Duplicate Therapy        Allergy        No Medication Administration End Date        Incorrect Dose        Additional Drug Therapy Needed        Significant med changes from prior encounter (inform family/care partners about these prior to discharge). ASA 81 mg + Plavix 75 mg for 21 days, then aspirin alone. First doses given 04/23/23.   PTA lisinopril-HCTZ 20-12.5 mg. Off HCTZ component in CIR. Counsel patient to stop Plavix after 05/13/23.      Resume HCTZ component on discharge if clinically warranted.  Other   Was refusing rosuvastatin during acute care stay due to reported intolerance, but received a dose in CIR. Stroke team notes discussed Leqvio injections pending insurance authorization. Noted may need follow up with [facility] as outpatient.       Clinically significant medication issues were identified that warrant physician communication and completion of prescribed/recommended actions by midnight of the next day:  No   Time spent performing this drug regimen review (minutes):  20  Thank you for involving pharmacy in this patient's care.   Rockwell Alexandria, PharmD PGY1 Pharmacy Resident 04/28/2023 11:30 AM

## 2023-04-28 NOTE — Progress Notes (Signed)
PROGRESS NOTE   Subjective/Complaints:  Pt at eob. Family in room. Asked about right shoulder MRI results. Arms about the same as over the weekend.   ROS: Patient denies fever, rash, sore throat, blurred vision, dizziness, nausea, vomiting, diarrhea, cough, shortness of breath or chest pain,   headache, or mood change.   Objective:   MR CERVICAL SPINE WO CONTRAST  Result Date: 04/27/2023 CLINICAL DATA:  Cervical acute myelopathy. Intermittent right arm weakness and numbness. EXAM: MRI CERVICAL SPINE WITHOUT CONTRAST TECHNIQUE: Multiplanar, multisequence MR imaging of the cervical spine was performed. No intravenous contrast was administered. COMPARISON:  None Available. FINDINGS: Alignment: Facet mediated anterolisthesis at C7-T1 and T1-2, up to 3 mm at C7-T1. Vertebrae: No fracture, evidence of discitis, or bone lesion. Cord: Normal signal and morphology. Posterior Fossa, vertebral arteries, paraspinal tissues: Negative. Disc levels: C2-3: Degenerative facet spurring which is moderate on the right. Patent canal and foramina C3-4: Degenerative facet spurring and uncovertebral ridging eccentric to the right where there is moderate foraminal stenosis. Bulging disc and ligamentum flavum thickening mildly flattens the right posterior cord C4-5: Endplate and facet spurring eccentric to the right. Circumferential disc bulging. Right foraminal narrowing is moderate. Left foraminal narrowing is mild C5-6: Disc narrowing with left paracentral protrusion and buttressing osteophyte. Asymmetric left paracentral and uncovertebral ridging with left foraminal impingement. Degenerative facet spurring on both sides. Right foraminal narrowing is moderate based on gradient images C6-7: Disc narrowing and bulging with asymmetric right uncovertebral ridging. Moderate right foraminal narrowing. C7-T1:Degenerative facet spurring with anterolisthesis. Disc height loss  and mild bulging. No neural impingement IMPRESSION: 1. Generalized cervical spine degeneration with mild anterolisthesis at C7-T1 and T1-2. 2. At least moderate foraminal stenosis on the right at C3-4 to C5-6. High-grade left foraminal stenosis at C4-5 and especially C5-6. 3. Up to mild spinal stenosis.  Normal cord signal abnormality. Electronically Signed   By: Tiburcio Pea M.D.   On: 04/27/2023 05:09   Recent Labs    04/28/23 0433  WBC 7.7  HGB 15.1  HCT 43.9  PLT 283   Recent Labs    04/28/23 0433  NA 137  K 4.2  CL 102  CO2 27  GLUCOSE 111*  BUN 13  CREATININE 1.18  CALCIUM 9.1    Intake/Output Summary (Last 24 hours) at 04/28/2023 0856 Last data filed at 04/27/2023 2200 Gross per 24 hour  Intake 1140 ml  Output --  Net 1140 ml        Physical Exam: Vital Signs Blood pressure (!) 148/87, pulse 90, temperature 98.6 F (37 C), temperature source Oral, resp. rate 18, height 5\' 9"  (1.753 m), weight 75 kg, SpO2 100 %.  Constitutional: No distress . Vital signs reviewed. HEENT: NCAT, EOMI, oral membranes moist Neck: supple Cardiovascular: RRR without murmur. No JVD    Respiratory/Chest: CTA Bilaterally without wheezes or rales. Normal effort    GI/Abdomen: BS +, non-tender, non-distended Ext: no clubbing, cyanosis, or edema Psych: pleasant and cooperative  Skin: No evidence of breakdown, no evidence of rash Neurologic: Cranial nerves II through XII intact, motor strength is 5/5 in bilateral deltoid, bicep, tricep, 4+/5 R--stable appearance.  5/5 Left grip, 5/5 B  hip flexor, knee extensors, ankle dorsiflexor and plantar flexor. Language fairly fluid.  Sensory exam normal sensation to light touch a in bilateral upper and lower extremities Finger to thumb opposition intact, neg dysdiadochokinesis RAM BUE  Cerebellar no evidence of dysmetria FNF  Musculoskeletal: Full range of motion in all 4 extremities. No joint swelling Neg impingement RIght shoulder full ROM without  pain. Jobe's + on right  Neuro:     Assessment/Plan: 1. Functional deficits which require 3+ hours per day of interdisciplinary therapy in a comprehensive inpatient rehab setting. Physiatrist is providing close team supervision and 24 hour management of active medical problems listed below. Physiatrist and rehab team continue to assess barriers to discharge/monitor patient progress toward functional and medical goals  Care Tool:  Bathing    Body parts bathed by patient: Right arm, Left arm, Chest, Abdomen, Front perineal area, Buttocks, Right upper leg, Left upper leg, Face, Left lower leg, Right lower leg         Bathing assist Assist Level: Independent     Upper Body Dressing/Undressing Upper body dressing   What is the patient wearing?: Pull over shirt    Upper body assist Assist Level: Independent    Lower Body Dressing/Undressing Lower body dressing      What is the patient wearing?: Underwear/pull up, Pants     Lower body assist Assist for lower body dressing: Independent     Toileting Toileting    Toileting assist Assist for toileting: Independent     Transfers Chair/bed transfer  Transfers assist     Chair/bed transfer assist level: Independent     Locomotion Ambulation   Ambulation assist      Assist level: Independent   Max distance: >353ft   Walk 10 feet activity   Assist     Assist level: Independent Assistive device: No Device   Walk 50 feet activity   Assist    Assist level: Independent Assistive device: No Device    Walk 150 feet activity   Assist    Assist level: Independent Assistive device: No Device    Walk 10 feet on uneven surface  activity   Assist     Assist level: Independent     Wheelchair     Assist Is the patient using a wheelchair?: No   Wheelchair activity did not occur: N/A         Wheelchair 50 feet with 2 turns activity    Assist    Wheelchair 50 feet with 2 turns  activity did not occur: N/A       Wheelchair 150 feet activity     Assist  Wheelchair 150 feet activity did not occur: N/A       Blood pressure (!) 148/87, pulse 90, temperature 98.6 F (37 C), temperature source Oral, resp. rate 18, height 5\' 9"  (1.753 m), weight 75 kg, SpO2 100 %.  Medical Problem List and Plan: 1. Functional deficits secondary to multiple L brain stroke/infarcts- precentral and post central gyri             -patient may  shower             -ELOS/Goals: pt can go home today.  -f/u with neurology, pcp, and Dr. Kirtland Bouchard at [facility] -Cervical MRI has forminal stenosis Bilateral C4-5 and C5-6    -shoulder MRI pending but not something which would change discharge regardless of findings.    2.  Antithrombotics: -DVT/anticoagulation:  Pharmaceutical: Lovenox>>start 40 mg daily             -  antiplatelet therapy: Aspirin and Plavix for three weeks followed by aspirin alone (started 5/22)   3. Pain Management: Tylenol as needed -MRI of right shoulder literally just completed. -pt does have + Jobe's Test on right -recommend f/u and discussion with Dr. Wynn Banker as outpt. Pt reports having had an injection in the past with great results.    4. Mood/Behavior/Sleep: LCSW to evaluate and provide emotional support             -antipsychotic agents: n/a May benefit from anxiety medication but will cont to monitor for now    5. Neuropsych/cognition: This patient is capable of making decisions on his own behalf.   6. Skin/Wound Care: Routine skin care checks   7. Fluids/Electrolytes/Nutrition: Routine Is and Os and follow-up chemistries Elevated Creat recheck BMP in am  May d/c IVF cont  to enc po    8: Hypertension: monitor TID and prn (home Zestoretic 20/12.5 daily)             -continue amlodipine 5 mg daily- reduce to 2.5mg              -continue lisinopril 20 mg daily reduce to 5mg  Vitals:   04/28/23 0403 04/28/23 0800  BP: (!) 153/88 (!) 148/87  Pulse: 86 90  Resp:  18 18  Temp: 98.7 F (37.1 C) 98.6 F (37 C)  SpO2: 100% 100%   -5/27 yesterday amlodipine was increased back to 5mg  and Lisinopril to 10mg . BP's sl better. Will likely need further mgt as outpt. Pt has bp cuff at home. Spoke to family    9: Hyperlipidemia: "Discussed Leqvio injection with patient and family pending insurance authorization. Form faxed to Leq[employer]. May need outpatient follow up with lipid clinic in the setting of hyperlipidemia and statin intolerance." >> per Dr. 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              -consider 30 day event monitor per neurology,   -cardiology to place 5/26??--will refer to outpt cardiology for monitor   12: GERD: s/p esophageal dil in 2023: continue Protonix   13: OA right knee- getting home R knee brace, added aspercreme, consider injection if needed     LOS: 4 days A FACE TO FACE EVALUATION WAS PERFORMED  Ranelle Oyster 04/28/2023, 8:56 AM

## 2023-04-29 ENCOUNTER — Other Ambulatory Visit: Payer: Self-pay | Admitting: Physician Assistant

## 2023-04-30 ENCOUNTER — Other Ambulatory Visit: Payer: Self-pay | Admitting: *Deleted

## 2023-04-30 DIAGNOSIS — I480 Paroxysmal atrial fibrillation: Secondary | ICD-10-CM

## 2023-04-30 DIAGNOSIS — I639 Cerebral infarction, unspecified: Secondary | ICD-10-CM

## 2023-05-02 ENCOUNTER — Other Ambulatory Visit: Payer: Self-pay

## 2023-05-05 ENCOUNTER — Encounter: Payer: Self-pay | Admitting: Physical Therapy

## 2023-05-05 ENCOUNTER — Ambulatory Visit: Payer: Medicare Other | Attending: Physician Assistant | Admitting: Physical Therapy

## 2023-05-05 DIAGNOSIS — R262 Difficulty in walking, not elsewhere classified: Secondary | ICD-10-CM | POA: Insufficient documentation

## 2023-05-05 DIAGNOSIS — I639 Cerebral infarction, unspecified: Secondary | ICD-10-CM | POA: Diagnosis not present

## 2023-05-05 DIAGNOSIS — M6281 Muscle weakness (generalized): Secondary | ICD-10-CM | POA: Insufficient documentation

## 2023-05-05 NOTE — Therapy (Signed)
OUTPATIENT PHYSICAL THERAPY NEURO EVALUATION   Patient Name: Ethan Payne MRN: [id number] DOB:[birth year], 77 y.o., male Today's Date: 05/05/2023   PCP: Durene Cal, MD REFERRING PROVIDER: Hildred Alamin, PA  END OF SESSION:  PT End of Session - 05/05/23 0841     Visit Number 1    Date for PT Re-Evaluation 08/05/23    Authorization Type [employer]    PT Start Time [id number]    PT Stop Time 0930    PT Time Calculation (min) 48 min    Activity Tolerance Patient limited by fatigue    Behavior During Therapy Flat affect             Past Medical History:  Diagnosis Date   Acute prostatitis 02/04/2008   ATRIAL MYXOMA 02/04/2008   Closed fracture of four ribs 11/01/2009   HYPERLIPIDEMIA 02/04/2008   HYPERTENSION 02/04/2008   Osteoarthritis of right knee 04/18/2010   Past Surgical History:  Procedure Laterality Date   CATARACT EXTRACTION Left 08/03/2019   CORONARY ARTERY BYPASS GRAFT  2007   x2 Saph vein   COX-MAZE MICROWAVE ABLATION  2007   EXCISION OF ATRIAL MYXOMA  2007   Dr C. [facility]   Patient Active Problem List   Diagnosis Date Noted   Acute embolic stroke (HCC) 04/24/2023   Acute ischemic stroke (HCC) 04/21/2023   Paroxysmal atrial fibrillation (HCC) 12/21/2021   Leg length difference, acquired 01/07/2018   Rotator cuff syndrome of right shoulder 12/08/2017   BPH associated with nocturia 04/10/2015   Health care maintenance 02/18/2015   Erectile dysfunction 02/18/2015   CAD (coronary artery disease) 02/17/2015   Heel pain 03/22/2011   GERD (gastroesophageal reflux disease) 03/22/2011   Osteoarthritis of right knee 04/18/2010   s/p excision of Atrial myxoma 02/04/2008   Hyperlipidemia 02/04/2008   Essential hypertension 02/04/2008    ONSET DATE: 04/21/23  REFERRING DIAG: CVA, weakness  THERAPY DIAG:  Muscle weakness (generalized)  Difficulty in walking, not elsewhere classified  Rationale for Evaluation and Treatment: Rehabilitation  SUBJECTIVE:                                                                                                                                                                                              SUBJECTIVE STATEMENT: See hx below, had CVA, was in hosptial, reports that he is doing better over the past few weeks, does have right knee pain.  C/O tingling in the arm, reports that he has 3 acres of yard that he needs to get back to. Pt accompanied by: significant other  PERTINENT HISTORY: Patient presented as code stroke 04/21/23 due to acute onset  of right-sided weakness. Patient was doing yard work then right hand became weak patient laid down on his hammock for around 15 minutes and when he went to get up he could not hold his cell phone and fell. TNK given. MRI showed Multiple small foci of acute ischemia along the left precentral and postcentral gyri. PMH: DM, HTN, hyperlipidemia, right knee pain   PAIN:  Are you having pain? Yes: NPRS scale: 0/10 Pain location: left medial knee Pain description: ache, sore Aggravating factors: walking, stairs up to 6/10 Relieving factors: rest not doing anything, voltaren pain can be 0/10  PRECAUTIONS: None  WEIGHT BEARING RESTRICTIONS: No  FALLS: Has patient fallen in last 6 months? Yes. Number of falls 1  LIVING ENVIRONMENT: Lives with: lives with their family Lives in: House/apartment Stairs: Yes: Internal: 12 steps; can reach both Has following equipment at home: None  PLOF: some exercise at home 2-3x/week, yard work, housework, Mudlogger  PATIENT GOALS: be normal   OBJECTIVE:   COGNITION: Overall cognitive status: Within functional limits for tasks assessed   SENSATION: Tingling some numbness in the right UE MUSCLE LENGTH: Very tight right HS, but has right knee OA issues and pain  POSTURE: rounded shoulders and forward head  LOWER EXTREMITY MMT     Active  Right Eval Left Eval  Hip flexion 4+ 5  Hip extension    Hip abduction 4+ 5  Hip adduction    Hip  internal rotation    Hip external rotation    Knee flexion 4+ 5  Knee extension 4+ 5  Ankle dorsiflexion 4 5  Ankle plantarflexion    Ankle inversion 4   Ankle eversion 4-    (Blank rows = not tested) STAIRS: Level of Assistance: Complete Independence Stair Negotiation Technique: Alternating Pattern  with Bilateral Rails Number of Stairs: 12  Height of Stairs: 4" and 6"  Comments: mild limp on the right, difficulty going down  GAIT: Gait pattern: mild antalgic ont he right, with right knee not fully extending Distance walked: 100 feet Assistive device utilized: None Level of assistance: Complete Independence Comments: slow  FUNCTIONAL TESTS:  BERG = 50/56 TUG = 11 seconds TODAY'S TREATMENT:                                                                                                                              DATE:     PATIENT EDUCATION: Education details: POC/HEP Person educated: Patient and Spouse Education method: Programmer, multimedia, Facilities manager, and Verbal cues Education comprehension: verbalized understanding  HOME EXERCISE PROGRAM: High level balance changing firm to foam, WBOS to tandem  GOALS: Goals reviewed with patient? Yes  SHORT TERM GOALS: Target date: 05/19/23  Independent with initial HEP Goal status: INITIAL  LONG TERM GOALS: Target date: 07/05/23  Independent with advanced HEP Goal status: INITIAL  2.  Report able to mow lawn without difficulty Goal status: INITIAL  3.  Decrease TUG time to 7 seconds  Goal status: INITIAL  4.  Increase Berg balance to 52/56 Goal status: INITIAL  ASSESSMENT:  CLINICAL IMPRESSION: Patient is a 77 y.o. male who was seen today for physical therapy evaluation and treatment for CVA and weakness, he is progressing very well after CVA, has mild deficits to gait and balance, has issues with right knee and right shoulder from prior to CVA.  Has OA right knee and RC tear of the right shoulder, he is very active with  yardwork and house repairs.  OBJECTIVE IMPAIRMENTS: Abnormal gait, cardiopulmonary status limiting activity, decreased activity tolerance, decreased balance, decreased coordination, decreased endurance, decreased mobility, difficulty walking, decreased ROM, decreased strength, decreased safety awareness, impaired flexibility, and pain.   REHAB POTENTIAL: Good  CLINICAL DECISION MAKING: Stable/uncomplicated  EVALUATION COMPLEXITY: Low  PLAN:  PT FREQUENCY: 1x/week  PT DURATION: 8 weeks  PLANNED INTERVENTIONS: Therapeutic exercises, Therapeutic activity, Neuromuscular re-education, Balance training, Gait training, Patient/Family education, Self Care, Joint mobilization, Stair training, and Manual therapy  PLAN FOR NEXT SESSION: will start gym , high level balance and see 1x/week and try to return him to [employer], PT 05/05/2023, 8:42 AM

## 2023-05-09 ENCOUNTER — Telehealth: Payer: Self-pay | Admitting: Pharmacy Technician

## 2023-05-09 NOTE — Telephone Encounter (Addendum)
Dr. Pearlean Brownie, [facility] note:  Patient will be scheduled as soon as possible.  Auth Submission: APPROVED Site of care: Site of care: [facility] Payer: BCBS MEDICARE Medication & CPT/J Code(s) submitted: Le[employer] (Inclisiran) [id number] Route of submission (phone, fax, portal):  Phone #[phone] Fax #[phone] Auth type: Buy/Bill Units/visits requested: X3 Reference number: [id number] Approval from: 05/08/23 to 05/07/24   [facility]: Approved

## 2023-05-13 ENCOUNTER — Encounter: Payer: Medicare Other | Attending: Physical Medicine & Rehabilitation | Admitting: Physical Medicine & Rehabilitation

## 2023-05-13 ENCOUNTER — Encounter: Payer: Self-pay | Admitting: Physical Medicine & Rehabilitation

## 2023-05-13 ENCOUNTER — Other Ambulatory Visit: Payer: Self-pay | Admitting: Physician Assistant

## 2023-05-13 VITALS — BP 164/80 | HR 83 | Ht 69.0 in | Wt 172.0 lb

## 2023-05-13 DIAGNOSIS — Z8673 Personal history of transient ischemic attack (TIA), and cerebral infarction without residual deficits: Secondary | ICD-10-CM

## 2023-05-13 NOTE — Patient Instructions (Signed)

## 2023-05-13 NOTE — Progress Notes (Signed)
Subjective:    Patient ID: Ethan Payne, male    DOB: 1946/04/01, 77 y.o.   MRN: [id number] admitted to rehab 04/24/2023 for inpatient therapies to consist of PT, ST and OT at least three hours five days a week. Past admission physiatrist, therapy team and rehab RN have worked together to provide customized collaborative inpatient rehab. 5/24: Follow-up labs with small increase in serum creatinine; BUN normal. He developed return of RUE weakness and facial droop and code stroke called. CT head without bleed. Likely worsening of recent deficit in setting of mild dehydration physical exertion. Given IVFs and rest from therapy. Encouraged PO fluids. VS and NIHSS continued x 2 hours. Due to transient right arm numbness and weakness, positive Jobe's test and history of rotator cuff tear, MRI of right shoulder obtained. Will consider shoulder injection and/or EMG at follow-up with PM&R. Cervical MRI has forminal stenosis C4-5 and C5-6. Given Voltaren gel to try.   CT HEAD CODE STROKE WO CONTRAST   Result Date: 04/25/2023 CLINICAL DATA:  Code stroke. Neuro deficit, acute, stroke suspected RUE weakness EXAM: CT HEAD WITHOUT CONTRAST TECHNIQUE: Contiguous axial images were obtained from the base of the skull through the vertex without intravenous contrast. RADIATION DOSE REDUCTION: This exam was performed according to the departmental dose-optimization program which includes automated exposure control, adjustment of the mA and/or kV according to patient size and/or use of iterative reconstruction technique. COMPARISON:  CT head Apr 21, 2023. FINDINGS: Brain: Known left perirolandic acute infarct better characterized on recent MRI. No evidence of interval acute large vascular territory infarction, hemorrhage, hydrocephalus, extra-axial collection or mass lesion/mass effect. Patchy white matter hypodensities, nonspecific but compatible with chronic microvascular ischemic disease. Vascular: No hyperdense vessel  identified. Skull: No acute fracture. Sinuses/Orbits: Clear sinuses.  No acute orbital findings. Other: No mastoid effusions. ASPECTS [facility] Stroke Program Early CT Score) total score (0-10 with 10 being normal): 10 when measured for new/interval abnormality. IMPRESSION: 1. Known left perirolandic acute infarct better characterized on recent MRI. 2. No evidence of acute large vascular territory infarct or acute hemorrhage. Code stroke imaging results were communicated on 04/25/2023 at 2:26 pm to provider Pearlean Brownie via telephone, who verbally acknowledged these results. Electronically Signed   By: Feliberto Harts M.D.   On: 04/25/2023 14:27   MRI CERVICAL SPINE WITHOUT CONTRAST   TECHNIQUE: Multiplanar, multisequence MR imaging of the cervical spine was performed. No intravenous contrast was administered.   COMPARISON:  None Available.   FINDINGS: Alignment: Facet mediated anterolisthesis at C7-T1 and T1-2, up to 3 mm at C7-T1.   Vertebrae: No fracture, evidence of discitis, or bone lesion.   Cord: Normal signal and morphology.   Posterior Fossa, vertebral arteries, paraspinal tissues: Negative.   Disc levels:   C2-3: Degenerative facet spurring which is moderate on the right. Patent canal and foramina   C3-4: Degenerative facet spurring and uncovertebral ridging eccentric to the right where there is moderate foraminal stenosis. Bulging disc and ligamentum flavum thickening mildly flattens the right posterior cord   C4-5: Endplate and facet spurring eccentric to the right. Circumferential disc bulging. Right foraminal narrowing is moderate. Left foraminal narrowing is mild   C5-6: Disc narrowing with left paracentral protrusion and buttressing osteophyte. Asymmetric left paracentral and uncovertebral ridging with left foraminal impingement. Degenerative facet spurring on both sides. Right foraminal narrowing is moderate based on gradient images   C6-7: Disc narrowing and bulging  with asymmetric right uncovertebral ridging. Moderate right foraminal narrowing.   C7-T1:Degenerative  facet spurring with anterolisthesis. Disc height loss and mild bulging. No neural impingement   IMPRESSION: 1. Generalized cervical spine degeneration with mild anterolisthesis at C7-T1 and T1-2. 2. At least moderate foraminal stenosis on the right at C3-4 to C5-6. High-grade left foraminal stenosis at C4-5 and especially C5-6. 3. Up to mild spinal stenosis.  Normal cord signal abnormality.     Electronically Signed   By: Tiburcio Pea M.D.   On: 04/27/2023 05:09    MRI OF THE RIGHT SHOULDER WITHOUT CONTRAST   TECHNIQUE: Multiplanar, multisequence MR imaging of the shoulder was performed. No intravenous contrast was administered.   COMPARISON:  Right shoulder x-rays dated November 26, 2017.   FINDINGS: Rotator cuff: Chronic full-thickness, full width tears of the supraspinatus and infraspinatus tendons with retraction to the glenoid. Intact subscapularis tendon with severe distal tendinosis. The teres minor tendon is unremarkable.   Muscles:  Mild infraspinatus muscle atrophy.  No muscle edema.   Biceps long head: Moderate intra-articular tendinosis. Diminutive, somewhat ill-defined tendon within the bicipital groove, suspicious for high-grade partial tear.   Acromioclavicular Joint: Moderate arthropathy of the acromioclavicular joint. No significant subacromial/subdeltoid bursal fluid.   Glenohumeral Joint: No significant joint effusion. Mild diffuse cartilage thinning. Areas of partial-thickness cartilage loss over the humeral head.   Labrum: Diffusely degenerated. Torn superior and posterosuperior labrum.   Bones: High-riding humeral head. No acute fracture or dislocation. Reactive subcortical cystic change in the lesser tuberosity. No suspicious bone lesion.   Other: None.   IMPRESSION: 1. Chronic full-thickness, full width tears of the supraspinatus  and infraspinatus tendons with retraction to the glenoid. Mild infraspinatus muscle atrophy. 2. Severe distal subscapularis tendinosis. 3. Moderate intra-articular biceps tendinosis. Diminutive, somewhat ill-defined biceps tendon within the bicipital groove, suspicious for high-grade partial tear. 4. Degenerated and torn superior and posterosuperior labrum. 5. Mild glenohumeral and moderate acromioclavicular osteoarthritis.     Electronically Signed   By: Obie Dredge M.D.   On: 04/28/2023 09:20  Admit date: 04/24/2023 Discharge date: 04/28/2023 HPI Right shoulder and knee pain , discussed test results from inpt rehab, pt states he had shoulder injection some years ago   Used aspercreme on RIght knee and shoulder with some relief Mod I all self care and mobiltity  PT eval with outpt therapy at [facility]    Pain Inventory Average Pain 0 Pain Right Now 0 My pain is  no pain  LOCATION OF PAIN  no pain  BOWEL Number of stools per week: 7 Oral laxative use No  Type of laxative . Enema or suppository use No  History of colostomy No  Incontinent No   BLADDER Normal In and out cath, frequency . Able to self cath  . Bladder incontinence No  Frequent urination No  Leakage with coughing No  Difficulty starting stream No  Incomplete bladder emptying No    Mobility walk without assistance how many minutes can you walk? unlimited ability to climb steps?  yes do you drive?  no  Function retired  Neuro/Psych tingling  Prior Studies Hospital f/u  Physicians involved in your care Hospital f/u   Family History  Problem Relation Age of Onset   Stroke Mother    Heart disease Father        MI 63   Hypertension Father    Ovarian cancer Sister    Social History   Socioeconomic History   Marital status: Married    Spouse name: Not on file   Number of children: Not  on file   Years of education: Not on file   Highest education level: Not on file   Occupational History   Occupation: married  Tobacco Use   Smoking status: Never   Smokeless tobacco: Never  Substance and Sexual Activity   Alcohol use: No   Drug use: No   Sexual activity: Yes  Other Topics Concern   Not on file  Social History Narrative   Married will be 50 years in 2019 (wife seen at another practice). 2 children-daughter unmarried, professional student 56 and son 36 in 2016. 2 grandsons-17 and 57 in 2019.       Still works as Product/process development scientist, does his own carpentry work      Hobbies: fishing   Social Determinants of Corporate investment banker Strain: Low Risk  (03/24/2023)   Overall Financial Resource Strain (CARDIA)    Difficulty of Paying Living Expenses: Not hard at all  Food Insecurity: No Food Insecurity (04/23/2023)   Hunger Vital Sign    Worried About Running Out of Food in the Last Year: Never true    Ran Out of Food in the Last Year: Never true  Transportation Needs: No Transportation Needs (04/23/2023)   PRAPARE - Administrator, Civil Service (Medical): No    Lack of Transportation (Non-Medical): No  Physical Activity: Insufficiently Active (03/24/2023)   Exercise Vital Sign    Days of Exercise per Week: 3 days    Minutes of Exercise per Session: 30 min  Stress: No Stress Concern Present (03/24/2023)   [employer] of Occupational Health - Occupational Stress Questionnaire    Feeling of Stress : Not at all  Social Connections: Moderately Isolated (03/24/2023)   Social Connection and Isolation Panel [NHANES]    Frequency of Communication with Friends and Family: More than three times a week    Frequency of Social Gatherings with Friends and Family: More than three times a week    Attends Religious Services: Never    Database administrator or Organizations: No    Attends Banker Meetings: Never    Marital Status: Married   Past Surgical History:  Procedure Laterality Date   CATARACT EXTRACTION Left 08/03/2019    CORONARY ARTERY BYPASS GRAFT  2007   x2 Saph vein   COX-MAZE MICROWAVE ABLATION  2007   EXCISION OF ATRIAL MYXOMA  2007   Dr C. Cornelius Moras   Past Medical History:  Diagnosis Date   Acute prostatitis 02/04/2008   ATRIAL MYXOMA 02/04/2008   Closed fracture of four ribs 11/01/2009   HYPERLIPIDEMIA 02/04/2008   HYPERTENSION 02/04/2008   Osteoarthritis of right knee 04/18/2010   BP (!) 164/80   Pulse 83   Ht 5\' 9"  (1.753 m)   Wt 172 lb (78 kg)   SpO2 98%   BMI 25.40 kg/m   Opioid Risk Score:   Fall Risk Score:  `1  Depression screen [facility] 2/9     05/13/2023    9:41 AM 03/24/2023    1:53 PM 01/22/2022    8:08 AM 10/06/2019    3:37 PM 08/24/2018    8:43 AM 08/21/2017    9:13 AM 04/22/2016   11:10 AM  Depression screen PHQ 2/9  Decreased Interest 0 0 0 0 0 0 0  Down, Depressed, Hopeless 0 0 0 0 0 0 0  PHQ - 2 Score 0 0 0 0 0 0 0  Altered sleeping 0        Tired,  decreased energy 0        Change in appetite 0        Feeling bad or failure about yourself  0        Trouble concentrating 0        Moving slowly or fidgety/restless 0        Suicidal thoughts 0        PHQ-9 Score 0           Review of Systems  All other systems reviewed and are negative.     Objective:   Physical Exam Vitals and nursing note reviewed.  HENT:     Head: Normocephalic and atraumatic.  Eyes:     Extraocular Movements: Extraocular movements intact.     Conjunctiva/sclera: Conjunctivae normal.     Pupils: Pupils are equal, round, and reactive to light.  Skin:    General: Skin is warm and dry.  Neurological:     Mental Status: He is alert and oriented to person, place, and time.  Psychiatric:        Mood and Affect: Mood normal.        Behavior: Behavior normal.   5/5 Bilateral Delt biceps triceps grip , HF, KE, ADF Sensation intact B C5,6,7,8,   RIght knee without effusion , no erythema  No pain with ROM, +Crepitus   Right shoulder normal ROM  Amb without assistive device      Assessment &  Plan:   Left peri rolandic stroke excellent recovery do not think pt will need OP therapy for long, cognitively ok  D/C plavix cont ASA daily per neuro rec Graduated return to driving instructions were provided. It is recommended that the patient first drives with another licensed driver in an empty parking lot. If the patient does well with this, and they can drive on a quiet street with the licensed driver. If the patient does well with this they can drive on a busy street with a licensed driver. If the patient does well with this, the next time out they can go by himself. For the first month after resuming driving, I recommend no nighttime or Interstate driving.

## 2023-05-14 ENCOUNTER — Ambulatory Visit: Payer: Medicare Other | Admitting: Physical Therapy

## 2023-05-14 ENCOUNTER — Encounter: Payer: Self-pay | Admitting: Physical Therapy

## 2023-05-14 DIAGNOSIS — I639 Cerebral infarction, unspecified: Secondary | ICD-10-CM

## 2023-05-14 DIAGNOSIS — R262 Difficulty in walking, not elsewhere classified: Secondary | ICD-10-CM | POA: Diagnosis not present

## 2023-05-14 DIAGNOSIS — M6281 Muscle weakness (generalized): Secondary | ICD-10-CM

## 2023-05-14 NOTE — Therapy (Signed)
OUTPATIENT PHYSICAL THERAPY NEURO TREATMENT   Patient Name: Ethan Payne MRN: [id number] DOB:05/18/1946, 78 y.o., male Today's Date: 05/14/2023   PCP: Durene Cal, MD REFERRING PROVIDER: Hildred Alamin, PA  END OF SESSION:  PT End of Session - 05/14/23 0933     Visit Number 2    Date for PT Re-Evaluation 08/05/23    PT Start Time 0933    PT Stop Time 1015    PT Time Calculation (min) 42 min    Activity Tolerance Patient limited by fatigue    Behavior During Therapy Flat affect             Past Medical History:  Diagnosis Date   Acute prostatitis 02/04/2008   ATRIAL MYXOMA 02/04/2008   Closed fracture of four ribs 11/01/2009   HYPERLIPIDEMIA 02/04/2008   HYPERTENSION 02/04/2008   Osteoarthritis of right knee 04/18/2010   Past Surgical History:  Procedure Laterality Date   CATARACT EXTRACTION Left 08/03/2019   CORONARY ARTERY BYPASS GRAFT  2007   x2 Saph vein   COX-MAZE MICROWAVE ABLATION  2007   EXCISION OF ATRIAL MYXOMA  2007   Dr C[facility]   Patient Active Problem List   Diagnosis Date Noted   Acute embolic stroke (HCC) 04/24/2023   Acute ischemic stroke (HCC) 04/21/2023   Paroxysmal atrial fibrillation (HCC) 12/21/2021   Leg length difference, acquired 01/07/2018   Rotator cuff syndrome of right shoulder 12/08/2017   BPH associated with nocturia 04/10/2015   Health care maintenance 02/18/2015   Erectile dysfunction 02/18/2015   CAD (coronary artery disease) 02/17/2015   Heel pain 03/22/2011   GERD (gastroesophageal reflux disease) 03/22/2011   Osteoarthritis of right knee 04/18/2010   s/p excision of Atrial myxoma 02/04/2008   Hyperlipidemia 02/04/2008   Essential hypertension 02/04/2008    ONSET DATE: 04/21/23  REFERRING DIAG: CVA, weakness  THERAPY DIAG:  Muscle weakness (generalized)  Difficulty in walking, not elsewhere classified  Acute ischemic stroke (HCC)  Acute embolic stroke (HCC)  Rationale for Evaluation and Treatment:  Rehabilitation  SUBJECTIVE:                                                                                                                                                                                             SUBJECTIVE STATEMENT: "I feel like I am doing fine" Seen MD yesterday, will return back to him in 4 weeks to get a shot Pt accompanied by: significant other  PERTINENT HISTORY: Patient presented as code stroke 04/21/23 due to acute onset of right-sided weakness. Patient was doing yard work then right hand became weak patient laid down on his hammock for  around 15 minutes and when he went to get up he could not hold his cell phone and fell. TNK given. MRI showed Multiple small foci of acute ischemia along the left precentral and postcentral gyri. PMH: DM, HTN, hyperlipidemia, right knee pain   PAIN:  Are you having pain? Yes: NPRS scale: 0/10 Pain location: left medial knee Pain description: ache, sore Aggravating factors: walking, stairs up to 6/10 Relieving factors: rest not doing anything, voltaren pain can be 0/10  PRECAUTIONS: None  WEIGHT BEARING RESTRICTIONS: No  FALLS: Has patient fallen in last 6 months? Yes. Number of falls 1  LIVING ENVIRONMENT: Lives with: lives with their family Lives in: House/apartment Stairs: Yes: Internal: 12 steps; can reach both Has following equipment at home: None  PLOF: some exercise at home 2-3x/week, yard work, housework, Mudlogger  PATIENT GOALS: be normal   OBJECTIVE:   COGNITION: Overall cognitive status: Within functional limits for tasks assessed   SENSATION: Tingling some numbness in the right UE MUSCLE LENGTH: Very tight right HS, but has right knee OA issues and pain  POSTURE: rounded shoulders and forward head  LOWER EXTREMITY MMT     Active  Right Eval Left Eval  Hip flexion 4+ 5  Hip extension    Hip abduction 4+ 5  Hip adduction    Hip internal rotation    Hip external rotation    Knee flexion 4+  5  Knee extension 4+ 5  Ankle dorsiflexion 4 5  Ankle plantarflexion    Ankle inversion 4   Ankle eversion 4-    (Blank rows = not tested) STAIRS: Level of Assistance: Complete Independence Stair Negotiation Technique: Alternating Pattern  with Bilateral Rails Number of Stairs: 12  Height of Stairs: 4" and 6"  Comments: mild limp on the right, difficulty going down  GAIT: Gait pattern: mild antalgic ont he right, with right knee not fully extending Distance walked: 100 feet Assistive device utilized: None Level of assistance: Complete Independence Comments: slow  FUNCTIONAL TESTS:  BERG = 50/56 TUG = 11 seconds TODAY'S TREATMENT:                                                                                                                              DATE:  05/14/23 NuStep L5 x 6 min   HS curls green 2x12 LAQ 3lb 2x10 S2S holding yellow ball 2x10 S2S x10 on airex  Side step on and off airex  4in box on airex step ups x 10 HS curls 35lb 2x10 Leg Ext 10lb 2x10 Standing marches 2x10  PATIENT EDUCATION: Education details: POC/HEP Person educated: Patient and Spouse Education method: Programmer, multimedia, Facilities manager, and Verbal cues Education comprehension: verbalized understanding  HOME EXERCISE PROGRAM: High level balance changing firm to foam, WBOS to tandem  GOALS: Goals reviewed with patient? Yes  SHORT TERM GOALS: Target date: 05/19/23  Independent with initial HEP Goal status: INITIAL  LONG TERM GOALS: Target date: 07/05/23  Independent with advanced HEP Goal status: INITIAL  2.  Report able to mow lawn without difficulty Goal status: INITIAL  3.  Decrease TUG time to 7 seconds Goal status: INITIAL  4.  Increase Berg balance to 52/56 Goal status: INITIAL  ASSESSMENT:  CLINICAL IMPRESSION: Patient is a 77 y.o. male who was seen today for physical therapy treatment for CVA and weakness, he is progressing very well after CVA, has mild deficits to gait  and balance, has issues with right knee and right shoulder from prior to CVA.  Has OA right knee and RC tear of the right shoulder, he is very active with yardwork and house repairs. He enters doing well with mild gait deviation. He tolerated an initial progression to TE well evident by no subjective reports of increase pain. Some cues for full ROM needed with LAQ and leg extensions. Some instability present with standing march on airex.  OBJECTIVE IMPAIRMENTS: Abnormal gait, cardiopulmonary status limiting activity, decreased activity tolerance, decreased balance, decreased coordination, decreased endurance, decreased mobility, difficulty walking, decreased ROM, decreased strength, decreased safety awareness, impaired flexibility, and pain.   REHAB POTENTIAL: Good  CLINICAL DECISION MAKING: Stable/uncomplicated  EVALUATION COMPLEXITY: Low  PLAN:  PT FREQUENCY: 1x/week  PT DURATION: 8 weeks  PLANNED INTERVENTIONS: Therapeutic exercises, Therapeutic activity, Neuromuscular re-education, Balance training, Gait training, Patient/Family education, Self Care, Joint mobilization, Stair training, and Manual therapy  PLAN FOR NEXT SESSION: will start gym , high level balance and see 1x/week and try to return him to PLOF   Grayce Sessions, PTA 05/14/2023, 9:33 AM

## 2023-05-21 ENCOUNTER — Encounter: Payer: Self-pay | Admitting: Physical Therapy

## 2023-05-21 ENCOUNTER — Ambulatory Visit: Payer: Medicare Other | Admitting: Physical Therapy

## 2023-05-21 DIAGNOSIS — I639 Cerebral infarction, unspecified: Secondary | ICD-10-CM

## 2023-05-21 DIAGNOSIS — M6281 Muscle weakness (generalized): Secondary | ICD-10-CM | POA: Diagnosis not present

## 2023-05-21 DIAGNOSIS — R262 Difficulty in walking, not elsewhere classified: Secondary | ICD-10-CM

## 2023-05-21 NOTE — Therapy (Signed)
OUTPATIENT PHYSICAL THERAPY NEURO TREATMENT   Patient Name: Ethan Payne MRN: [id number] DOB:01-29-1946, 77 y.o., male Today's Date: 05/21/2023   PCP: Durene Cal, MD REFERRING PROVIDER: Hildred Alamin, PA  END OF SESSION:  PT End of Session - 05/21/23 0928     Visit Number 3    Date for PT Re-Evaluation 08/05/23    Authorization Type [employer]    PT Start Time 0930    PT Stop Time 1015    PT Time Calculation (min) 45 min    Activity Tolerance Patient tolerated treatment well    Behavior During Therapy [facility] for tasks assessed/performed             Past Medical History:  Diagnosis Date   Acute prostatitis 02/04/2008   ATRIAL MYXOMA 02/04/2008   Closed fracture of four ribs 11/01/2009   HYPERLIPIDEMIA 02/04/2008   HYPERTENSION 02/04/2008   Osteoarthritis of right knee 04/18/2010   Past Surgical History:  Procedure Laterality Date   CATARACT EXTRACTION Left 08/03/2019   CORONARY ARTERY BYPASS GRAFT  2007   x2 Saph vein   COX-MAZE MICROWAVE ABLATION  2007   EXCISION OF ATRIAL MYXOMA  2007   Dr C. [facility]   Patient Active Problem List   Diagnosis Date Noted   Acute embolic stroke (HCC) 04/24/2023   Acute ischemic stroke (HCC) 04/21/2023   Paroxysmal atrial fibrillation (HCC) 12/21/2021   Leg length difference, acquired 01/07/2018   Rotator cuff syndrome of right shoulder 12/08/2017   BPH associated with nocturia 04/10/2015   Health care maintenance 02/18/2015   Erectile dysfunction 02/18/2015   CAD (coronary artery disease) 02/17/2015   Heel pain 03/22/2011   GERD (gastroesophageal reflux disease) 03/22/2011   Osteoarthritis of right knee 04/18/2010   s/p excision of Atrial myxoma 02/04/2008   Hyperlipidemia 02/04/2008   Essential hypertension 02/04/2008    ONSET DATE: 04/21/23  REFERRING DIAG: CVA, weakness  THERAPY DIAG:  Muscle weakness (generalized)  Difficulty in walking, not elsewhere classified  Acute ischemic stroke (HCC)  Acute embolic stroke  (HCC)  Rationale for Evaluation and Treatment: Rehabilitation  SUBJECTIVE:                                                                                                                                                                                             SUBJECTIVE STATEMENT: Doing good,  Pt accompanied by: significant other  PERTINENT HISTORY: Patient presented as code stroke 04/21/23 due to acute onset of right-sided weakness. Patient was doing yard work then right hand became weak patient laid down on his hammock for around 15 minutes and when he went to get up  he could not hold his cell phone and fell. TNK given. MRI showed Multiple small foci of acute ischemia along the left precentral and postcentral gyri. PMH: DM, HTN, hyperlipidemia, right knee pain   PAIN:  Are you having pain? Yes: NPRS scale: 0/10 Pain location: left medial knee Pain description: ache, sore Aggravating factors: walking, stairs up to 6/10 Relieving factors: rest not doing anything, voltaren pain can be 0/10  PRECAUTIONS: None  WEIGHT BEARING RESTRICTIONS: No  FALLS: Has patient fallen in last 6 months? Yes. Number of falls 1  LIVING ENVIRONMENT: Lives with: lives with their family Lives in: House/apartment Stairs: Yes: Internal: 12 steps; can reach both Has following equipment at home: None  PLOF: some exercise at home 2-3x/week, yard work, housework, Mudlogger  PATIENT GOALS: be normal   OBJECTIVE:   COGNITION: Overall cognitive status: Within functional limits for tasks assessed   SENSATION: Tingling some numbness in the right UE MUSCLE LENGTH: Very tight right HS, but has right knee OA issues and pain  POSTURE: rounded shoulders and forward head  LOWER EXTREMITY MMT     Active  Right Eval Left Eval  Hip flexion 4+ 5  Hip extension    Hip abduction 4+ 5  Hip adduction    Hip internal rotation    Hip external rotation    Knee flexion 4+ 5  Knee extension 4+ 5  Ankle  dorsiflexion 4 5  Ankle plantarflexion    Ankle inversion 4   Ankle eversion 4-    (Blank rows = not tested) STAIRS: Level of Assistance: Complete Independence Stair Negotiation Technique: Alternating Pattern  with Bilateral Rails Number of Stairs: 12  Height of Stairs: 4" and 6"  Comments: mild limp on the right, difficulty going down  GAIT: Gait pattern: mild antalgic ont he right, with right knee not fully extending Distance walked: 100 feet Assistive device utilized: None Level of assistance: Complete Independence Comments: slow  FUNCTIONAL TESTS:  BERG = 50/56 TUG = 11 seconds TODAY'S TREATMENT:                                                                                                                              DATE:  05/21/23 NuStep L5 x 6 min   HS curls 35lb 2x10  Leg Ext 10lb 2x10, RLE 5lb x 10  Rows and lats 35lb 2x10 6in forward and lateral step ups  Heel raises black bar 2x10 RUE Er graded resistance Rue IR yellow 2x10  05/14/23 NuStep L5 x 6 min   HS curls green 2x12 LAQ 3lb 2x10 S2S holding yellow ball 2x10 S2S x10 on airex  Side step on and off airex  4in box on airex step ups x 10 HS curls 35lb 2x10 Leg Ext 10lb 2x10 Standing marches 2x10  PATIENT EDUCATION: Education details: POC/HEP Person educated: Patient and Spouse Education method: Explanation, Facilities manager, and Verbal cues Education comprehension: verbalized understanding  HOME EXERCISE PROGRAM: High level  balance changing firm to foam, WBOS to tandem  GOALS: Goals reviewed with patient? Yes  SHORT TERM GOALS: Target date: 05/19/23  Independent with initial HEP Goal status: Met 05/21/23  LONG TERM GOALS: Target date: 07/05/23  Independent with advanced HEP Goal status: INITIAL  2.  Report able to mow lawn without difficulty Goal status: INITIAL  3.  Decrease TUG time to 7 seconds Goal status: INITIAL  4.  Increase Berg balance to 52/56 Goal status:  INITIAL  ASSESSMENT:  CLINICAL IMPRESSION: Patient is a 77 y.o. male who was seen today for physical therapy treatment for CVA and weakness, he is progressing very well after CVA, has mild deficits to gait and balance, has issues with right knee and right shoulder from prior to CVA.  Has OA right knee and RC tear of the right shoulder, he is very active with yardwork and house repairs. Again enters doing well with mild gait deviation. Added shoulder interventions to session today. Pt is very weak with RUE ER. Some cues for full ROM needed with LAQ and leg extensions. Postural cues required with sated rows. Tactile cues needed to elbow with ER and IR.  OBJECTIVE IMPAIRMENTS: Abnormal gait, cardiopulmonary status limiting activity, decreased activity tolerance, decreased balance, decreased coordination, decreased endurance, decreased mobility, difficulty walking, decreased ROM, decreased strength, decreased safety awareness, impaired flexibility, and pain.   REHAB POTENTIAL: Good  CLINICAL DECISION MAKING: Stable/uncomplicated  EVALUATION COMPLEXITY: Low  PLAN:  PT FREQUENCY: 1x/week  PT DURATION: 8 weeks  PLANNED INTERVENTIONS: Therapeutic exercises, Therapeutic activity, Neuromuscular re-education, Balance training, Gait training, Patient/Family education, Self Care, Joint mobilization, Stair training, and Manual therapy  PLAN FOR NEXT SESSION: will start gym , high level balance and see 1x/week and try to return him to PLOF   Grayce Sessions, PTA 05/21/2023, 9:28 AM

## 2023-05-27 ENCOUNTER — Other Ambulatory Visit: Payer: Self-pay | Admitting: Physical Medicine & Rehabilitation

## 2023-05-27 ENCOUNTER — Other Ambulatory Visit: Payer: Self-pay | Admitting: Physician Assistant

## 2023-05-28 ENCOUNTER — Ambulatory Visit: Payer: Medicare Other

## 2023-05-28 DIAGNOSIS — R262 Difficulty in walking, not elsewhere classified: Secondary | ICD-10-CM | POA: Diagnosis not present

## 2023-05-28 DIAGNOSIS — M6281 Muscle weakness (generalized): Secondary | ICD-10-CM

## 2023-05-28 DIAGNOSIS — I639 Cerebral infarction, unspecified: Secondary | ICD-10-CM

## 2023-05-28 NOTE — Therapy (Signed)
OUTPATIENT PHYSICAL THERAPY NEURO TREATMENT   Patient Name: Ethan Payne MRN: [id number] DOB:May 05, 1946, 77 y.o., male Today's Date: 05/28/2023   PCP: Durene Cal, MD REFERRING PROVIDER: Hildred Alamin, PA  END OF SESSION:  PT End of Session - 05/28/23 0932     Visit Number 4    Date for PT Re-Evaluation 08/05/23    Authorization Type [employer]    PT Start Time 0932    PT Stop Time 1015    PT Time Calculation (min) 43 min    Activity Tolerance Patient tolerated treatment well    Behavior During Therapy [facility] for tasks assessed/performed             Past Medical History:  Diagnosis Date   Acute prostatitis 02/04/2008   ATRIAL MYXOMA 02/04/2008   Closed fracture of four ribs 11/01/2009   HYPERLIPIDEMIA 02/04/2008   HYPERTENSION 02/04/2008   Osteoarthritis of right knee 04/18/2010   Past Surgical History:  Procedure Laterality Date   CATARACT EXTRACTION Left 08/03/2019   CORONARY ARTERY BYPASS GRAFT  2007   x2 Saph vein   COX-MAZE MICROWAVE ABLATION  2007   EXCISION OF ATRIAL MYXOMA  2007   Dr C. [facility]   Patient Active Problem List   Diagnosis Date Noted   Acute embolic stroke (HCC) 04/24/2023   Acute ischemic stroke (HCC) 04/21/2023   Paroxysmal atrial fibrillation (HCC) 12/21/2021   Leg length difference, acquired 01/07/2018   Rotator cuff syndrome of right shoulder 12/08/2017   BPH associated with nocturia 04/10/2015   Health care maintenance 02/18/2015   Erectile dysfunction 02/18/2015   CAD (coronary artery disease) 02/17/2015   Heel pain 03/22/2011   GERD (gastroesophageal reflux disease) 03/22/2011   Osteoarthritis of right knee 04/18/2010   s/p excision of Atrial myxoma 02/04/2008   Hyperlipidemia 02/04/2008   Essential hypertension 02/04/2008    ONSET DATE: 04/21/23  REFERRING DIAG: CVA, weakness  THERAPY DIAG:  Muscle weakness (generalized)  Difficulty in walking, not elsewhere classified  Acute ischemic stroke (HCC)  Acute embolic stroke  (HCC)  Rationale for Evaluation and Treatment: Rehabilitation  SUBJECTIVE:                                                                                                                                                                                             SUBJECTIVE STATEMENT: Doing good R shoulder, slept on a propped up pillow, now neck stiff today, R knee still sore Pt accompanied by: significant other  PERTINENT HISTORY: Patient presented as code stroke 04/21/23 due to acute onset of right-sided weakness. Patient was doing yard work then right hand became weak patient  laid down on his hammock for around 15 minutes and when he went to get up he could not hold his cell phone and fell. TNK given. MRI showed Multiple small foci of acute ischemia along the left precentral and postcentral gyri. PMH: DM, HTN, hyperlipidemia, right knee pain   PAIN:  Are you having pain? Yes: NPRS scale: 0/10 Pain location: left medial knee Pain description: ache, sore Aggravating factors: walking, stairs up to 6/10 Relieving factors: rest not doing anything, voltaren pain can be 0/10  PRECAUTIONS: None  WEIGHT BEARING RESTRICTIONS: No  FALLS: Has patient fallen in last 6 months? Yes. Number of falls 1  LIVING ENVIRONMENT: Lives with: lives with their family Lives in: House/apartment Stairs: Yes: Internal: 12 steps; can reach both Has following equipment at home: None  PLOF: some exercise at home 2-3x/week, yard work, housework, Mudlogger  PATIENT GOALS: be normal   OBJECTIVE:   COGNITION: Overall cognitive status: Within functional limits for tasks assessed   SENSATION: Tingling some numbness in the right UE MUSCLE LENGTH: Very tight right HS, but has right knee OA issues and pain  POSTURE: rounded shoulders and forward head  LOWER EXTREMITY MMT     Active  Right Eval Left Eval  Hip flexion 4+ 5  Hip extension    Hip abduction 4+ 5  Hip adduction    Hip internal rotation     Hip external rotation    Knee flexion 4+ 5  Knee extension 4+ 5  Ankle dorsiflexion 4 5  Ankle plantarflexion    Ankle inversion 4   Ankle eversion 4-    (Blank rows = not tested) STAIRS: Level of Assistance: Complete Independence Stair Negotiation Technique: Alternating Pattern  with Bilateral Rails Number of Stairs: 12  Height of Stairs: 4" and 6"  Comments: mild limp on the right, difficulty going down  GAIT: Gait pattern: mild antalgic ont he right, with right knee not fully extending Distance walked: 100 feet Assistive device utilized: None Level of assistance: Complete Independence Comments: slow  FUNCTIONAL TESTS:  BERG = 50/56 TUG = 11 seconds TODAY'S TREATMENT:                                                                                                                              DATE:  05/28/23: Nustep L5, 6 min UE's and LE's Airex forward/ back unilateral stepping, no UE support Yellow t band ErIR Heel toe rocks standing 20x Single leg stance R LE 7 sec(stopped due to R knee pain), L 20 sec Lat pulls and rows 2 sets 10 35# Hs curls 35#, B concentric, R eccentric B knee ext 35#, 2 sets 15 Standing for heel raises B forefeet on black bar 15 x 2 Alt toe taps to target on 6' step, 2 -30 sec intervals   05/21/23 NuStep L5 x 6 min   HS curls 35lb 2x10  Leg Ext 10lb 2x10, RLE 5lb x 10  Lats  and rows 35# 2 x 10 6in forward and lateral step ups  Heel raises black bar 2x10 RUE Er graded resistance Rue IR yellow 2x10  05/14/23 NuStep L5 x 6 min   HS curls green 2x12 LAQ 3lb 2x10 S2S holding yellow ball 2x10 S2S x10 on airex  Side step on and off airex  4in box on airex step ups x 10 HS curls 35lb 2x10 Leg Ext 10lb 2x10 Standing marches 2x10  PATIENT EDUCATION: Education details: POC/HEP Person educated: Patient and Spouse Education method: Programmer, multimedia, Facilities manager, and Verbal cues Education comprehension: verbalized understanding  HOME EXERCISE  PROGRAM: High level balance changing firm to foam, WBOS to tandem Access Code: [email or web address] URL: [email or web address] Date: 05/28/2023 Prepared by: Caralee Ates  Exercises - Shoulder External Rotation and Scapular Retraction with Resistance  - 1 x daily - 3 x weekly - 3 sets - 10 reps GOALS: Goals reviewed with patient? Yes  SHORT TERM GOALS: Target date: 05/19/23  Independent with initial HEP Goal status: Met 05/21/23  LONG TERM GOALS: Target date: 07/05/23  Independent with advanced HEP Goal status: INITIAL  2.  Report able to mow lawn without difficulty Goal status: INITIAL  3.  Decrease TUG time to 7 seconds Goal status: INITIAL  4.  Increase Berg balance to 52/56 Goal status: INITIAL  ASSESSMENT:  CLINICAL IMPRESSION: Patient is a 77 y.o. male who was seen today for physical therapy treatment for CVA and R sided weakness, he is progressing very well after CVA, has mild deficits to gait and balance, has issues with right knee and right shoulder from prior to CVA. He has been very active, assisting his daughter with packing her house to move, walking up down steps.  He states today that he is near his baseline function.  Has not mowed lawn yet due to intense heat.  Will reassess next week, may be ready for DC.Marland Kitchen OBJECTIVE IMPAIRMENTS: Abnormal gait, cardiopulmonary status limiting activity, decreased activity tolerance, decreased balance, decreased coordination, decreased endurance, decreased mobility, difficulty walking, decreased ROM, decreased strength, decreased safety awareness, impaired flexibility, and pain.   REHAB POTENTIAL: Good  CLINICAL DECISION MAKING: Stable/uncomplicated  EVALUATION COMPLEXITY: Low  PLAN:  PT FREQUENCY: 1x/week  PT DURATION: 8 weeks  PLANNED INTERVENTIONS: Therapeutic exercises, Therapeutic activity, Neuromuscular re-education, Balance training, Gait training, Patient/Family education, Self Care, Joint mobilization, Stair  training, and Manual therapy  PLAN FOR NEXT SESSION: will start gym , high level balance and see 1x/week and try to return him to PLOF   Xzayvion Vaeth L Arleta Ostrum, PT 05/28/2023, 12:44 PM

## 2023-06-04 ENCOUNTER — Other Ambulatory Visit: Payer: Self-pay

## 2023-06-04 ENCOUNTER — Ambulatory Visit: Payer: Medicare Other | Attending: Physician Assistant

## 2023-06-04 DIAGNOSIS — M6281 Muscle weakness (generalized): Secondary | ICD-10-CM | POA: Diagnosis not present

## 2023-06-04 DIAGNOSIS — R262 Difficulty in walking, not elsewhere classified: Secondary | ICD-10-CM | POA: Insufficient documentation

## 2023-06-04 DIAGNOSIS — I639 Cerebral infarction, unspecified: Secondary | ICD-10-CM | POA: Diagnosis not present

## 2023-06-04 NOTE — Therapy (Signed)
OUTPATIENT PHYSICAL THERAPY NEURO DISCHARGE SUMMARY   Patient Name: Ethan Payne MRN: [id number] DOB:1946-02-16, 77 y.o., male Today's Date: 06/04/2023  Progress Note Reporting Period 05/05/23 to 06/04/23  See note below for Objective Data and Assessment of Progress/Goals.     PCP: Durene Cal, MD REFERRING PROVIDER: Hildred Alamin, PA  END OF SESSION:  PT End of Session - 06/04/23 0934     Visit Number 5    Date for PT Re-Evaluation 08/05/23    Authorization Type BCBS [facility]    Activity Tolerance Patient tolerated treatment well    Behavior During Therapy [facility] for tasks assessed/performed             Past Medical History:  Diagnosis Date   Acute prostatitis 02/04/2008   ATRIAL MYXOMA 02/04/2008   Closed fracture of four ribs 11/01/2009   HYPERLIPIDEMIA 02/04/2008   HYPERTENSION 02/04/2008   Osteoarthritis of right knee 04/18/2010   Past Surgical History:  Procedure Laterality Date   CATARACT EXTRACTION Left 08/03/2019   CORONARY ARTERY BYPASS GRAFT  2007   x2 Saph vein   COX-MAZE MICROWAVE ABLATION  2007   EXCISION OF ATRIAL MYXOMA  2007   Dr C. [facility]   Patient Active Problem List   Diagnosis Date Noted   Acute embolic stroke (HCC) 04/24/2023   Acute ischemic stroke (HCC) 04/21/2023   Paroxysmal atrial fibrillation (HCC) 12/21/2021   Leg length difference, acquired 01/07/2018   Rotator cuff syndrome of right shoulder 12/08/2017   BPH associated with nocturia 04/10/2015   Health care maintenance 02/18/2015   Erectile dysfunction 02/18/2015   CAD (coronary artery disease) 02/17/2015   Heel pain 03/22/2011   GERD (gastroesophageal reflux disease) 03/22/2011   Osteoarthritis of right knee 04/18/2010   s/p excision of Atrial myxoma 02/04/2008   Hyperlipidemia 02/04/2008   Essential hypertension 02/04/2008    ONSET DATE: 04/21/23  REFERRING DIAG: CVA, weakness  THERAPY DIAG:  Muscle weakness (generalized)  Difficulty in walking, not elsewhere classified  Acute ischemic  stroke (HCC)  Acute embolic stroke (HCC)  Rationale for Evaluation and Treatment: Rehabilitation  SUBJECTIVE:                                                                                                                                                                                             SUBJECTIVE STATEMENT: Doing good R shoulder, slept on a propped up pillow, now neck stiff today, R knee still sore Pt accompanied by: significant other  PERTINENT HISTORY: Patient presented as code stroke 04/21/23 due to acute onset of right-sided weakness. Patient was doing yard work then right hand became weak patient laid  down on his hammock for around 15 minutes and when he went to get up he could not hold his cell phone and fell. TNK given. MRI showed Multiple small foci of acute ischemia along the left precentral and postcentral gyri. PMH: DM, HTN, hyperlipidemia, right knee pain   PAIN:  Are you having pain? Yes: NPRS scale: 0/10 Pain location: left medial knee Pain description: ache, sore Aggravating factors: walking, stairs up to 6/10 Relieving factors: rest not doing anything, voltaren pain can be 0/10  PRECAUTIONS: None  WEIGHT BEARING RESTRICTIONS: No  FALLS: Has patient fallen in last 6 months? Yes. Number of falls 1  LIVING ENVIRONMENT: Lives with: lives with their family Lives in: House/apartment Stairs: Yes: Internal: 12 steps; can reach both Has following equipment at home: None  PLOF: some exercise at home 2-3x/week, yard work, housework, Mudlogger  PATIENT GOALS: be normal   OBJECTIVE:   COGNITION: Overall cognitive status: Within functional limits for tasks assessed   SENSATION: Tingling some numbness in the right UE MUSCLE LENGTH: Very tight right HS, but has right knee OA issues and pain  POSTURE: rounded shoulders and forward head  LOWER EXTREMITY MMT     Active  Right Eval Left Eval  Hip flexion 4+ 5  Hip extension    Hip abduction 4+ 5  Hip  adduction    Hip internal rotation    Hip external rotation    Knee flexion 4+ 5  Knee extension 4+ 5  Ankle dorsiflexion 4 5  Ankle plantarflexion    Ankle inversion 4   Ankle eversion 4-    (Blank rows = not tested) STAIRS: Level of Assistance: Complete Independence Stair Negotiation Technique: Alternating Pattern  with Bilateral Rails Number of Stairs: 12  Height of Stairs: 4" and 6"  Comments: mild limp on the right, difficulty going down  GAIT: Gait pattern: mild antalgic ont he right, with right knee not fully extending Distance walked: 100 feet Assistive device utilized: None Level of assistance: Complete Independence Comments: slow  FUNCTIONAL TESTS:  BERG = 50/56 TUG = 11 seconds TODAY'S TREATMENT:                                                                                                                              DATE:  06/04/23: Reassessed BERG, TUG, addressed goals Gait outdoors x 6 min over asphalt , curbs, inclines, patient I, some antalgia R LE   05/28/23: Nustep L5, 6 min UE's and LE's Airex forward/ back unilateral stepping, no UE support Yellow t band ErIR Heel toe rocks standing 20x Single leg stance R LE 7 sec(stopped due to R knee pain), L 20 sec Lat pulls and rows 2 sets 10 35# Hs curls 35#, B concentric, R eccentric B knee ext 35#, 2 sets 15 Standing for heel raises B forefeet on black bar 15 x 2 Alt toe taps to target on 6' step, 2 -30 sec intervals  05/21/23 NuStep L5 x 6 min   HS curls 35lb 2x10  Leg Ext 10lb 2x10, RLE 5lb x 10  Lats and rows 35# 2 x 10 6in forward and lateral step ups  Heel raises black bar 2x10 RUE Er graded resistance Rue IR yellow 2x10  05/14/23 NuStep L5 x 6 min   HS curls green 2x12 LAQ 3lb 2x10 S2S holding yellow ball 2x10 S2S x10 on airex  Side step on and off airex  4in box on airex step ups x 10 HS curls 35lb 2x10 Leg Ext 10lb 2x10 Standing marches 2x10  PATIENT EDUCATION: Education details:  POC/HEP Person educated: Patient and Spouse Education method: Programmer, multimedia, Facilities manager, and Verbal cues Education comprehension: verbalized understanding  HOME EXERCISE PROGRAM: High level balance changing firm to foam, WBOS to tandem Access Code: [email or web address] URL: [email or web address] Date: 05/28/2023 Prepared by: Caralee Ates  Exercises - Shoulder External Rotation and Scapular Retraction with Resistance  - 1 x daily - 3 x weekly - 3 sets - 10 reps GOALS: Goals reviewed with patient? Yes  SHORT TERM GOALS: Target date: 05/19/23  Independent with initial HEP Goal status: Met 05/21/23  LONG TERM GOALS: Target date: 07/05/23  Independent with advanced HEP Goal status: INITIAL  2.  Report able to mow lawn without difficulty Goal status: INITIAL 06/04/23; has not mowed due to scorched lawn due to heat, but has used pressure washer and has been carrying boxes up and down steps for moving his daughter, without problems  3.  Decrease TUG time to 7 seconds Goal status: INITIAL  7/324: 7.2 sec  4.  Increase Berg balance to 52/56 Goal status: INITIAL 06/04/23: 56/56  ASSESSMENT:  CLINICAL IMPRESSION: Patient is a 77 y.o. male who is participating in physical therapy treatment for CVA and R sided weakness, he is progressing very well after CVA. he met all of his goals with BERG assessment and TUG score.  Has weakness R rotator cuff and R knee OA which affect his mobility. His balance deficits from CVA appear to be resolved today.   Will DC today , patient agrees. OBJECTIVE IMPAIRMENTS: Abnormal gait, cardiopulmonary status limiting activity, decreased activity tolerance, decreased balance, decreased coordination, decreased endurance, decreased mobility, difficulty walking, decreased ROM, decreased strength, decreased safety awareness, impaired flexibility, and pain.   REHAB POTENTIAL: Good  CLINICAL DECISION MAKING: Stable/uncomplicated  EVALUATION COMPLEXITY:  Low  PLAN:  PT FREQUENCY: 1x/week  PT DURATION: 8 weeks  PLANNED INTERVENTIONS: Therapeutic exercises, Therapeutic activity, Neuromuscular re-education, Balance training, Gait training, Patient/Family education, Self Care, Joint mobilization, Stair training, and Manual therapy  PLAN FOR NEXT SESSION: dc today Skyelar Halliday L Ruford Dudzinski, PT 06/04/2023, 10:09 AM

## 2023-06-10 ENCOUNTER — Encounter: Payer: Medicare Other | Attending: Physical Medicine & Rehabilitation | Admitting: Physical Medicine & Rehabilitation

## 2023-06-10 ENCOUNTER — Encounter: Payer: Self-pay | Admitting: Physical Medicine & Rehabilitation

## 2023-06-10 VITALS — BP 146/82 | HR 84 | Ht 69.0 in | Wt 171.0 lb

## 2023-06-10 DIAGNOSIS — M1711 Unilateral primary osteoarthritis, right knee: Secondary | ICD-10-CM | POA: Diagnosis not present

## 2023-06-10 MED ORDER — BETAMETHASONE SOD PHOS & ACET 6 (3-3) MG/ML IJ SUSP
6.00 mg | Freq: Once | INTRAMUSCULAR | Status: AC
Start: 2023-06-10 — End: 2023-06-10
  Administered 2023-06-10: 6 mg via INTRAMUSCULAR

## 2023-06-10 MED ORDER — LIDOCAINE HCL 1 % IJ SOLN
5.00 mL | Freq: Once | INTRAMUSCULAR | Status: AC
Start: 2023-06-10 — End: 2023-06-10
  Administered 2023-06-10: 5 mL

## 2023-06-10 NOTE — Patient Instructions (Signed)

## 2023-06-10 NOTE — Progress Notes (Signed)
Knee injection Without ultrasound guidance  Indication:Right Knee pain not relieved by medication management and other conservative care.  Informed consent was obtained after describing risks and benefits of the procedure with the patient, this includes bleeding, bruisin  g, infection and medication side effects. The patient wishes to proceed and has given written consent. The patient was placed in a recumbent position. The medial aspect of the knee was marked and prepped with Betadine and alcohol. It was then entered with a 25-gauge 1-1/2 inch needle and 1 mL of 1% lidocaine was injected into the skin and subcutaneous tissue. Then another 25g 1.5 in needle was inserted into the knee joint. After negative draw back for blood, a solution containing one ML of 6mg  per mL betamethasone and 3 mL of 1% lidocaine were injected. The patient tolerated the procedure well. Post procedure instructions were given.

## 2023-06-13 ENCOUNTER — Other Ambulatory Visit: Payer: Self-pay | Admitting: Family Medicine

## 2023-06-14 ENCOUNTER — Other Ambulatory Visit: Payer: Self-pay | Admitting: Family Medicine

## 2023-06-16 ENCOUNTER — Ambulatory Visit (INDEPENDENT_AMBULATORY_CARE_PROVIDER_SITE_OTHER): Payer: Medicare Other | Admitting: Family Medicine

## 2023-06-16 ENCOUNTER — Encounter: Payer: Self-pay | Admitting: Family Medicine

## 2023-06-16 VITALS — BP 130/80 | HR 75 | Temp 98.4°F | Ht 69.0 in | Wt 169.6 lb

## 2023-06-16 DIAGNOSIS — I480 Paroxysmal atrial fibrillation: Secondary | ICD-10-CM | POA: Diagnosis not present

## 2023-06-16 DIAGNOSIS — G720 Drug-induced myopathy: Secondary | ICD-10-CM | POA: Diagnosis not present

## 2023-06-16 DIAGNOSIS — I2510 Atherosclerotic heart disease of native coronary artery without angina pectoris: Secondary | ICD-10-CM

## 2023-06-16 DIAGNOSIS — I10 Essential (primary) hypertension: Secondary | ICD-10-CM | POA: Diagnosis not present

## 2023-06-16 DIAGNOSIS — R972 Elevated prostate specific antigen [PSA]: Secondary | ICD-10-CM

## 2023-06-16 DIAGNOSIS — Z8673 Personal history of transient ischemic attack (TIA), and cerebral infarction without residual deficits: Secondary | ICD-10-CM

## 2023-06-16 DIAGNOSIS — N401 Enlarged prostate with lower urinary tract symptoms: Secondary | ICD-10-CM

## 2023-06-16 DIAGNOSIS — E785 Hyperlipidemia, unspecified: Secondary | ICD-10-CM

## 2023-06-16 LAB — PSA: PSA: 4.8 ng/mL — ABNORMAL HIGH (ref 0.10–4.00)

## 2023-06-16 LAB — CBC WITH DIFFERENTIAL/PLATELET
Basophils Absolute: 0 10*3/uL (ref 0.0–0.1)
Basophils Relative: 0.4 % (ref 0.0–3.0)
Eosinophils Absolute: 0.4 10*3/uL (ref 0.0–0.7)
Eosinophils Relative: 4 % (ref 0.0–5.0)
HCT: 41.6 % (ref 39.0–52.0)
Hemoglobin: 14.1 g/dL (ref 13.0–17.0)
Lymphocytes Relative: 22.4 % (ref 12.0–46.0)
Lymphs Abs: 2.1 10*3/uL (ref 0.7–4.0)
MCHC: 33.8 g/dL (ref 30.0–36.0)
MCV: 91.8 fl (ref 78.0–100.0)
Monocytes Absolute: 0.7 10*3/uL (ref 0.1–1.0)
Monocytes Relative: 7.8 % (ref 3.0–12.0)
Neutro Abs: 6.2 10*3/uL (ref 1.4–7.7)
Neutrophils Relative %: 65.4 % (ref 43.0–77.0)
Platelets: 285 10*3/uL (ref 150.0–400.0)
RBC: 4.53 Mil/uL (ref 4.22–5.81)
RDW: 13.3 % (ref 11.5–15.5)
WBC: 9.4 10*3/uL (ref 4.0–10.5)

## 2023-06-16 LAB — COMPREHENSIVE METABOLIC PANEL
ALT: 21 U/L (ref 0–53)
AST: 22 U/L (ref 0–37)
Albumin: 4.4 g/dL (ref 3.5–5.2)
Alkaline Phosphatase: 37 U/L — ABNORMAL LOW (ref 39–117)
BUN: 15 mg/dL (ref 6–23)
CO2: 29 mEq/L (ref 19–32)
Calcium: 9.8 mg/dL (ref 8.4–10.5)
Chloride: 103 mEq/L (ref 96–112)
Creatinine, Ser: 1.04 mg/dL (ref 0.40–1.50)
GFR: 70.67 mL/min (ref >60.00)
Glucose, Bld: 107 mg/dL — ABNORMAL HIGH (ref 70–99)
Potassium: 3.8 mEq/L (ref 3.5–5.1)
Sodium: 139 mEq/L (ref 135–145)
Total Bilirubin: 0.9 mg/dL (ref 0.2–1.2)
Total Protein: 7.1 g/dL (ref 6.0–8.3)

## 2023-06-16 LAB — LDL CHOLESTEROL, DIRECT: Direct LDL: 87 mg/dL

## 2023-06-16 MED ORDER — ROSUVASTATIN CALCIUM 20 MG PO TABS: 20.0000 mg | ORAL_TABLET | Freq: Every day | ORAL | 3 refills | Status: DC

## 2023-06-16 MED ORDER — LISINOPRIL 20 MG PO TABS: 20.0000 mg | ORAL_TABLET | Freq: Every day | ORAL | 3 refills | Status: DC

## 2023-06-16 NOTE — Progress Notes (Signed)
Phone [phone] In person visit   Subjective:   Ethan Payne is a 77 y.o. year old very pleasant male patient who presents for/with See problem oriented charting Chief Complaint  Patient presents with   Medical Management of Chronic Issues    Pt would like PSA checked.   Hypertension    Pt would like to discuss bp meds since having stroke this past May.   Hyperlipidemia    Pt c/o leg pain on 40 mg of Rosuvastatin so he has cut it down to 20mg . Pt wants cholesterol levels checked.   right shoulder pain    Pt would ike to know if ortho can give him shoulder injection    Past Medical History-  Patient Active Problem List   Diagnosis Date Noted   Paroxysmal atrial fibrillation (HCC) 12/21/2021    Priority: High   CAD (coronary artery disease) 02/17/2015    Priority: High   s/p excision of Atrial myxoma 02/04/2008    Priority: High   BPH associated with nocturia 04/10/2015    Priority: Medium    Hyperlipidemia 02/04/2008    Priority: Medium    Essential hypertension 02/04/2008    Priority: Medium    Leg length difference, acquired 01/07/2018    Priority: Low   Rotator cuff syndrome of right shoulder 12/08/2017    Priority: Low   Health care maintenance 02/18/2015    Priority: Low   Erectile dysfunction 02/18/2015    Priority: Low   Heel pain 03/22/2011    Priority: Low   GERD (gastroesophageal reflux disease) 03/22/2011    Priority: Low   Osteoarthritis of right knee 04/18/2010    Priority: Low   Acute embolic stroke (HCC) 04/24/2023   Acute ischemic stroke (HCC) 04/21/2023    Medications- reviewed and updated Current Outpatient Medications  Medication Sig Dispense Refill   acetaminophen (TYLENOL) 325 MG tablet Take 1-2 tablets (325-650 mg total) by mouth every 4 (four) hours as needed for mild pain.     amLODipine (NORVASC) 5 MG tablet Take 1 tablet (5 mg total) by mouth daily. (Patient taking differently: Take 2.5 mg by mouth daily.) 90 tablet 3   aspirin  EC 81 MG tablet Take 1 tablet (81 mg total) by mouth daily. Swallow whole. 30 tablet 0   calcium carbonate (TUMS - DOSED IN MG ELEMENTAL CALCIUM) 500 MG chewable tablet Chew 2 tablets by mouth at bedtime.     clopidogrel (PLAVIX) 75 MG tablet Take 1 tablet (75 mg total) by mouth daily. 16 tablet 0   diclofenac Sodium (VOLTAREN) 1 % GEL Apply 2 g topically 4 (four) times daily.     lisinopril (ZESTRIL) 10 MG tablet TAKE 1 TABLET(10 MG) BY MOUTH DAILY 30 tablet 0   rosuvastatin (CRESTOR) 40 MG tablet TAKE 1 TABLET(40 MG) BY MOUTH DAILY (Patient taking differently: 20 mg.) 90 tablet 3   Menthol-Methyl Salicylate (MUSCLE RUB) 10-15 % CREA Apply 1 Application topically 2 (two) times daily as needed for muscle pain. (Patient not taking: Reported on 06/16/2023) 85 g 0   No current facility-administered medications for this visit.   Social History   Social History Narrative   Married will be 50 years in 2019 (wife seen at another practice). 2 children-daughter unmarried, professional student 3 and son 71 in 2016. 2 grandsons-17 and 62 in 2019.       Still works as Product/process development scientist, does his own carpentry work      Hobbies: fishing      Objective:  BP 130/80   Pulse 75   Temp 98.4 F (36.9 C)   Ht 5\' 9"  (1.753 m)   Wt 169 lb 9.6 oz (76.9 kg)   SpO2 100%   BMI 25.05 kg/m  Gen: NAD, resting comfortably CV: RRR no murmurs rubs or gallops Lungs: CTAB no crackles, wheeze, rhonchi Ext: no edema Skin: warm, dry Neuro: perhaps 5-/5 right upper extremity otherwise grossly normal- 5/5 on left- very strong for his age    Assessment and Plan   # History of elevated PSA-has certainly had some variability-at last visit encouraged another visit with urology-had seen Dr. Alvester Morin in the past but last seen in 2022-they recommended 69-month follow-up- he reports they were considering biopsy at last visit but he was wanting to hold off as needed hernia surgery Lab Results  Component Value Date   PSA  3.74 11/12/2022   PSA 4.19 (H) 04/27/2021   PSA 3.07 02/15/2020   #Right Shoulder pain- considering injection  in early October but waiting 3 months from knee injection that he recently had on RIght knee- will be with Dr. Larna Daughters  #history of stroke may 2024- clopidogrel 75 mg added for 21 days- right upper extremity weakness and mild right facial droop #CAD- 2 vessel bypass done primarily due to already having open heart for atrial myxoma removal #hyperlipidemia S: Medication: rosuvastatin 20 mg tolerating- when he takes 40 mg he gets significant aches in his legs, aspirin 81 mg -considering zetia 10 mg or pcsk9 inhibitor- he declines    Lab Results  Component Value Date   CHOL 289 (H) 04/22/2023   HDL 50 04/22/2023   LDLCALC 219 (H) 04/22/2023   LDLDIRECT 103.0 02/15/2020   TRIG 100 04/22/2023   CHOLHDL 5.8 04/22/2023   A/P: history of stroke- was adequately treated with 3 weeks Plavix and aspirin together- he is now on aspirin alone. LDL above goal but he has not tolerated rosuvastatin 40 mg- down to 20 mg for a week- check LDL on this dose today - recommended zetia or lipid clinic to consider pcsk9 inhibitor- he declines CAD asymptomatic - continue current medications   # Paroxysmal atrial fibrillation-Remote at time of atrial myxoma removal-none since that time around 2010 - ischemic stroke noted 04/21/23 status post TNK- no recurrence of a fib thankfully- remain on aspirin alone  #hypertension S: medication: Amlodipine 5 mg (dizziness was worse on 10 mg) , lisinopril 20 mg--> 10 mg after cerebrovascular accident -home readings usually low 140's over 80's or 90s BP Readings from Last 3 Encounters:  06/16/23 130/80  06/10/23 (!) 146/82  05/13/23 (!) 164/80  A/P: poor control at home- increase lisinopril back to 20 mg and asked him to update me with home readings in a month  # Hyperglycemia/insulin resistance/prediabetes- peak a1c of 5.7 S:  Medication: none. Discussed statins  increase risk Exercise and diet- very active with work still Lab Results  Component Value Date   HGBA1C 6.1 (H) 04/21/2023   HGBA1C  10/20/2009    5.7 (NOTE) The ADA recommends the following therapeutic goal for glycemic control related to Hgb A1c measurement: Goal of therapy: <6.5 Hgb A1c  Reference: American Diabetes Association: Clinical Practice Recommendations 2010, Diabetes Care, 2010, 33: (Suppl  1).  A/P: discussed prediabetes risk- encouraged healthy eating/regular exercise  Recommended follow up: Return in about 6 months (around 12/17/2023) for physical or sooner if needed.Schedule b4 you leave. Future Appointments  Date Time Provider Department Center  07/04/2023 11:15 AM Windell Norfolk, MD [facility]  None  09/11/2023 11:15 AM Kirsteins, Victorino Sparrow, MD [facility]  03/29/2024  1:00 PM [facility] ANNUAL WELLNESS VISIT 1 [facility]   Lab/Order associations: fasting   ICD-10-CM   1. History of ischemic stroke  Z86.73     2. Hyperlipidemia, unspecified hyperlipidemia type  E78.5 Direct LDL    3. Drug-induced myopathy  G72.0     4. Coronary artery disease involving native coronary artery of native heart without angina pectoris  I25.10     5. Paroxysmal atrial fibrillation (HCC)  I48.0     6. Essential hypertension  I10 Comprehensive metabolic panel    CBC with Differential/Platelet    7. BPH associated with nocturia  N40.1    R35.1     8. Elevated PSA  R97.20 PSA      Meds ordered this encounter  Medications   lisinopril (ZESTRIL) 20 MG tablet    Sig: Take 1 tablet (20 mg total) by mouth daily.    Dispense:  90 tablet    Refill:  3    90 day not approp. from this provider. Courtesy refill after hospital d/c. Future refills to be handled by Dr Tana Conch PCP   rosuvastatin (CRESTOR) 20 MG tablet    Sig: Take 1 tablet (20 mg total) by mouth daily.    Dispense:  90 tablet    Refill:  3    Return precautions advised.  Tana Conch, MD

## 2023-06-16 NOTE — Patient Instructions (Addendum)
poor control at home- increase lisinopril back to 20 mg and asked him to update me with home readings in a month  Consider adding zetia to the rosuvastatin 20 mg - we can also get neurology opinion early august  Encouraged follow up with Dr. Alvester Morin of urology- PSA ordered today  Please stop by lab before you go If you have mychart- we will send your results within 3 business days of [place] receiving them.  If you do not have mychart- we will call you about results within 5 business days of [place] receiving them.  *please also note that you will see labs on mychart as soon as they post. I will later go in and write notes on them- will say "notes from Dr. Durene Cal"   Recommended follow up: Return in about 6 months (around 12/17/2023) for physical or sooner if needed.Schedule b4 you leave.

## 2023-06-18 ENCOUNTER — Other Ambulatory Visit: Payer: Self-pay

## 2023-06-18 DIAGNOSIS — R972 Elevated prostate specific antigen [PSA]: Secondary | ICD-10-CM

## 2023-07-04 ENCOUNTER — Ambulatory Visit: Payer: Medicare Other | Admitting: Neurology

## 2023-07-04 ENCOUNTER — Encounter: Payer: Self-pay | Admitting: Neurology

## 2023-07-04 VITALS — BP 161/88 | HR 80 | Ht 66.0 in | Wt 168.0 lb

## 2023-07-04 DIAGNOSIS — I63512 Cerebral infarction due to unspecified occlusion or stenosis of left middle cerebral artery: Secondary | ICD-10-CM

## 2023-07-04 NOTE — Progress Notes (Signed)
[facility]  PATIENT: Ethan Payne DOB: [birth year]  REQUESTING CLINICIAN: Kara Mead, NP HISTORY FROM: Patient/Chart review  REASON FOR VISIT: Left hemispheric stroke    HISTORICAL  CHIEF COMPLAINT:  Chief Complaint  Patient presents with   New Patient (Initial Visit)    Rm 12, here alone Pt is here for hospital follow up after stroke. Pt states he has been doing well. States symptoms have resolved.     HISTORY OF PRESENT ILLNESS:  This is a 77 year old gentleman past medical history hypertension, hyperlipidemia who presented to the hospital in May 20 with acute right-sided weakness.  CT head did not show any acute abnormality, CT angiogram did not show any large vessel occlusion.  He was given TNK, tolerated the procedure well.  His follow-up MRI showed multiple foci of acute strokes along the left precentral and postcentral gyri.  He did well in terms of recovery, prior to hospitalization, he was not on aspirin and during admission he was started on DAPT, aspirin and Plavix for 21-days.  He completed that regimen and currently on aspirin alone.  He has also completed physical therapy.  Currently patient reports that he is back to his normal self, no deficits in terms of the right hand weakness.  He is compliant with his medications including Crestor 20 mg, stated that he cannot take 40 mg due to muscle cramps.  He is also on amlodipine and Lisinopril for blood pressure.  Currently he does not have any concerns, no deficit.       Hospital course and summary  Brief HPI:   Ethan Payne is a 77 y.o. male  who presented to the [facility] ED on 04/21/2023 with acute right-sided weakness. Code stroke initiated. Head CT showed no acute process. Evaluated by Dr. Selina Cooley and risks, benefits, and alternatives to TNK were discussed with patient who gave informed consent to proceed after further discussion with his adult son. CTA showed no LVO therefore no intervention  indicated. TNK administered at 18:23 hours. Admitted to ICU. On 5/21, right weakness had improved, right facial droop present, some ataxia continued in right arm, weaker grip right hand, right hand bilateral sensation intact. Blood pressure adequately controlled. 2D Echo: EF 55 to 60%, mild concentric LVH, trivial MVR, trivial AVR, no evidence of left atrial mass, no shunt. LDL 219, HgbA1c 6.1%. VTE prophylaxis - SCDs. On 5/22, the patient has reported right upper extremity proximal weakness due to rotator cuff tear and right leg weakness due to a "bad knee". Right grip remains weaker, mild right facial droop persists. MRI brain with multiple small foci of acute ischemia along the left precentral and postcentral gyri. No hemorrhage or mass effect. Old bilateral cerebellar small vessel infarcts and findings of chronic follows all ischemia. Started on aspirin and Plavix for 21 days followed by aspirin alone. Patient reports statin intolerance and is refusing further statin therapy Discussed Leqvio injections with patient pending insurance authorization. May need outpatient follow up with lipid clinic in the setting of hyperlipidemia and statin intolerance per Dr. Pearlean Brownie. The patient has impairment mobility needing up to mod assist for ambulation due to right LE deficits. He fatigues quickly, has poor safety awareness and appears to have some right inattention as well. He is tolerating regular diet. Norvasc and Zestril continue.      Hospital Course: [name] was admitted to rehab 04/24/2023 for inpatient therapies to consist of PT, ST and OT at least three hours five days a  week. Past admission physiatrist, therapy team and rehab RN have worked together to provide customized collaborative inpatient rehab. 5/24: Follow-up labs with small increase in serum creatinine; BUN normal. He developed return of RUE weakness and facial droop and code stroke called. CT head without bleed. Likely worsening of recent  deficit in setting of mild dehydration physical exertion. Given IVFs and rest from therapy. Encouraged PO fluids. VS and NIHSS continued x 2 hours. Due to transient right arm numbness and weakness, positive Jobe's test and history of rotator cuff tear, MRI of right shoulder obtained. Will consider shoulder injection and/or EMG at follow-up with PM&R. Cervical MRI has forminal stenosis C4-5 and C5-6. Given Voltaren gel to try.    Blood pressures were monitored on TID basis and amlodipine 5 mg daily lisinopril 20 mg daily were continued. Reduced lisinopril to 10 mg. Reduced amlodipine to 2.5 mg for one day then increased back to 5 mg at discharge.   Prediabetes has been monitored with ac/hs CBG checks and carb modified diet.   Rehab course: During patient's stay in rehab weekly team conferences were held to monitor patient's progress, set goals and discuss barriers to discharge. At admission, patient was  independent  with functional mobility and with basic self-care skills.   He has had improvement in activity tolerance, balance, postural control as well as ability to compensate for deficits. He has had improvement in functional use RUE as well as improvement in awareness. He will follow-up with outpatient PT at [facility]  OTHER MEDICAL CONDITIONS: Hypertension, Hyperlipidemia    REVIEW OF SYSTEMS: Full 14 system review of systems performed and negative with exception of: As noted in the HPI   ALLERGIES: Allergies  Allergen Reactions   Hydrocodone Palpitations    After rib fractures. Hydrocodone-States had shortness of breath, 911 was called After rib fractures. Hydrocodone-States had shortness of breath, 911 was called   Loratadine Other (See Comments) and Anxiety    Heart speeds up Other reaction(s): Other (See Comments) Heart speeds up    HOME MEDICATIONS: Outpatient Medications Prior to Visit  Medication Sig Dispense Refill   amLODipine (NORVASC) 5 MG tablet Take 1 tablet (5 mg  total) by mouth daily. (Patient taking differently: Take 2.5 mg by mouth daily.) 90 tablet 3   aspirin EC 81 MG tablet Take 1 tablet (81 mg total) by mouth daily. Swallow whole. 30 tablet 0   calcium carbonate (TUMS - DOSED IN MG ELEMENTAL CALCIUM) 500 MG chewable tablet Chew 2 tablets by mouth at bedtime.     diclofenac Sodium (VOLTAREN) 1 % GEL Apply 2 g topically 4 (four) times daily.     lisinopril (ZESTRIL) 20 MG tablet Take 1 tablet (20 mg total) by mouth daily. 90 tablet 3   rosuvastatin (CRESTOR) 20 MG tablet Take 1 tablet (20 mg total) by mouth daily. 90 tablet 3   Menthol-Methyl Salicylate (MUSCLE RUB) 10-15 % CREA Apply 1 Application topically 2 (two) times daily as needed for muscle pain. (Patient not taking: Reported on 06/16/2023) 85 g 0   acetaminophen (TYLENOL) 325 MG tablet Take 1-2 tablets (325-650 mg total) by mouth every 4 (four) hours as needed for mild pain. (Patient not taking: Reported on 07/04/2023)     No facility-administered medications prior to visit.    PAST MEDICAL HISTORY: Past Medical History:  Diagnosis Date   Acute prostatitis 02/04/2008   ATRIAL MYXOMA 02/04/2008   Closed fracture of four ribs 11/01/2009   HYPERLIPIDEMIA 02/04/2008   HYPERTENSION 02/04/2008  Osteoarthritis of right knee 04/18/2010    PAST SURGICAL HISTORY: Past Surgical History:  Procedure Laterality Date   CATARACT EXTRACTION Left 08/03/2019   CORONARY ARTERY BYPASS GRAFT  2007   x2 Saph vein   COX-MAZE MICROWAVE ABLATION  2007   EXCISION OF ATRIAL MYXOMA  2007   Dr C. Cornelius Moras    FAMILY HISTORY: Family History  Problem Relation Age of Onset   Stroke Mother    Heart disease Father        MI 70   Hypertension Father    Ovarian cancer Sister     SOCIAL HISTORY: Social History   Socioeconomic History   Marital status: Married    Spouse name: Not on file   Number of children: Not on file   Years of education: Not on file   Highest education level: Not on file  Occupational History    Occupation: married  Tobacco Use   Smoking status: Never   Smokeless tobacco: Never  Substance and Sexual Activity   Alcohol use: No   Drug use: No   Sexual activity: Yes  Other Topics Concern   Not on file  Social History Narrative   Married will be 50 years in 2019 (wife seen at another practice). 2 children-daughter unmarried, professional student 16 and son 56 in 2016. 2 grandsons-17 and 65 in 2019.       Still works as Product/process development scientist, does his own carpentry work      Hobbies: fishing   Social Determinants of Corporate investment banker Strain: Low Risk  (03/24/2023)   Overall Financial Resource Strain (CARDIA)    Difficulty of Paying Living Expenses: Not hard at all  Food Insecurity: No Food Insecurity (04/23/2023)   Hunger Vital Sign    Worried About Running Out of Food in the Last Year: Never true    Ran Out of Food in the Last Year: Never true  Transportation Needs: No Transportation Needs (04/23/2023)   PRAPARE - Administrator, Civil Service (Medical): No    Lack of Transportation (Non-Medical): No  Physical Activity: Insufficiently Active (03/24/2023)   Exercise Vital Sign    Days of Exercise per Week: 3 days    Minutes of Exercise per Session: 30 min  Stress: No Stress Concern Present (03/24/2023)   [employer] of Occupational Health - Occupational Stress Questionnaire    Feeling of Stress : Not at all  Social Connections: Moderately Isolated (03/24/2023)   Social Connection and Isolation Panel [NHANES]    Frequency of Communication with Friends and Family: More than three times a week    Frequency of Social Gatherings with Friends and Family: More than three times a week    Attends Religious Services: Never    Database administrator or Organizations: No    Attends Banker Meetings: Never    Marital Status: Married  Catering manager Violence: Not At Risk (04/23/2023)   Humiliation, Afraid, Rape, and Kick questionnaire    Fear of  Current or Ex-Partner: No    Emotionally Abused: No    Physically Abused: No    Sexually Abused: No     PHYSICAL EXAM  GENERAL EXAM/CONSTITUTIONAL: Vitals:  Vitals:   07/04/23 1112  BP: (!) 161/88  Pulse: 80  Weight: 168 lb (76.2 kg)  Height: 5\' 6"  (1.676 m)   Body mass index is 27.12 kg/m. Wt Readings from Last 3 Encounters:  07/04/23 168 lb (76.2 kg)  06/16/23 169 lb 9.6  oz (76.9 kg)  06/10/23 171 lb (77.6 kg)   Patient is in no distress; well developed, nourished and groomed; neck is supple  MUSCULOSKELETAL: Gait, strength, tone, movements noted in Neurologic exam below  NEUROLOGIC: MENTAL STATUS:      No data to display         awake, alert, oriented to person, place and time recent and remote memory intact normal attention and concentration language fluent, comprehension intact, naming intact fund of knowledge appropriate  CRANIAL NERVE:  2nd, 3rd, 4th, 6th - Visual fields full to confrontation, extraocular muscles intact, no nystagmus 5th - facial sensation symmetric 7th - facial strength symmetric 8th - hearing intact 9th - palate elevates symmetrically, uvula midline 11th - shoulder shrug symmetric 12th - tongue protrusion midline  MOTOR:  normal bulk and tone, full strength in the BUE, BLE  SENSORY:  normal and symmetric to light touch  COORDINATION:  finger-nose-finger, fine finger movements normal  REFLEXES:  deep tendon reflexes present and symmetric  GAIT/STATION:  normal     DIAGNOSTIC DATA (LABS, IMAGING, TESTING) - I reviewed patient records, labs, notes, testing and imaging myself where available.  Lab Results  Component Value Date   WBC 9.4 06/16/2023   HGB 14.1 06/16/2023   HCT 41.6 06/16/2023   MCV 91.8 06/16/2023   PLT 285.0 06/16/2023      Component Value Date/Time   NA 139 06/16/2023 1112   K 3.8 06/16/2023 1112   CL 103 06/16/2023 1112   CO2 29 06/16/2023 1112   GLUCOSE 107 (H) 06/16/2023 1112   BUN 15  06/16/2023 1112   CREATININE 1.04 06/16/2023 1112   CALCIUM 9.8 06/16/2023 1112   PROT 7.1 06/16/2023 1112   ALBUMIN 4.4 06/16/2023 1112   AST 22 06/16/2023 1112   ALT 21 06/16/2023 1112   ALKPHOS 37 (L) 06/16/2023 1112   BILITOT 0.9 06/16/2023 1112   GFRNONAA >60 04/28/2023 0433   GFRAA  10/19/2009 0315    >60        The eGFR has been calculated using the MDRD equation. This calculation has not been validated in all clinical situations. eGFR's persistently <60 mL/min signify possible Chronic Kidney Disease.   Lab Results  Component Value Date   CHOL 289 (H) 04/22/2023   HDL 50 04/22/2023   LDLCALC 219 (H) 04/22/2023   LDLDIRECT 87.0 06/16/2023   TRIG 100 04/22/2023   CHOLHDL 5.8 04/22/2023   Lab Results  Component Value Date   HGBA1C 6.1 (H) 04/21/2023   No results found for: "VITAMINB12" Lab Results  Component Value Date   TSH 2.03 04/10/2015    MRI Brain 04/22/2023 1. Multiple small foci of acute ischemia along the left precentral and postcentral gyri. No hemorrhage or mass effect. 2. 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   CTA Head and Neck 04/21/2023 1. No large vessel occlusion, hemodynamically significant stenosis, dissection, or aneurysm in the head or neck vessels. 2. Multilevel cervical spondylosis, worst at C5-6 where there is at least moderate spinal canal stenosis.    ASSESSMENT AND PLAN  77 y.o. year old male with past medical history of hypertension, hyperlipidemia who is presenting after being admitted to the hospital for right hand weakness and found to have left middle cerebral artery territory stroke. He was given TNK, no complication.  He completed physical therapy with full recovery.  He also completed DAPT with aspirin and Plavix for 21 days and currently on aspirin alone.  He is on Crestor 20 mg and amlodipine and lisinopril for blood pressure management.  Currently he does not have any  deficit.  Plan will be for patient to continue all medications, he cannot take no more than 20 mg of Crestor due to muscle cramps.  Advised him to be compliant with his medications, eat a good diet, exercise.  All other questions answered.  Since he does not have any deficit and he is back to his normal self, he will continue to follow with his PCP and return to [place] as needed.   1. Cerebrovascular accident (CVA) due to occlusion of left middle cerebral artery [facility])      Patient Instructions  Continue current medications  Continue with lifestyle modification, good diet, exercise Continue to follow up with PCP  Return as needed  No orders of the defined types were placed in this encounter.   No orders of the defined types were placed in this encounter.   Return if symptoms worsen or fail to improve.    Windell Norfolk, MD 07/04/2023, 5:32 PM  [facility] [address] [place], [place] [address] [phone]

## 2023-07-04 NOTE — Patient Instructions (Addendum)
Continue current medications  Continue with lifestyle modification, good diet, exercise Continue to follow up with PCP  Return as needed

## 2023-07-17 ENCOUNTER — Other Ambulatory Visit: Payer: Self-pay | Admitting: Physical Medicine & Rehabilitation

## 2023-07-31 ENCOUNTER — Other Ambulatory Visit: Payer: Self-pay

## 2023-07-31 NOTE — Patient Outreach (Signed)
Telephone outreach to patient to obtain mRS was successfully completed. MRS= 0  Ethan Payne [facility] Care Management Assistant [phone]  

## 2023-08-01 ENCOUNTER — Inpatient Hospital Stay (HOSPITAL_COMMUNITY): Payer: Medicare Other

## 2023-08-01 ENCOUNTER — Emergency Department (HOSPITAL_COMMUNITY): Payer: Medicare Other

## 2023-08-01 ENCOUNTER — Telehealth: Payer: Self-pay

## 2023-08-01 ENCOUNTER — Inpatient Hospital Stay (HOSPITAL_COMMUNITY)
Admission: EM | Admit: 2023-08-01 | Discharge: 2023-08-05 | DRG: 041 | Disposition: A | Discharge status: Home / Self Care | Payer: Medicare Other | Attending: Neurology | Admitting: Neurology

## 2023-08-01 ENCOUNTER — Telehealth: Payer: Self-pay | Admitting: Family Medicine

## 2023-08-01 DIAGNOSIS — H539 Unspecified visual disturbance: Secondary | ICD-10-CM | POA: Diagnosis present

## 2023-08-01 DIAGNOSIS — R482 Apraxia: Secondary | ICD-10-CM | POA: Diagnosis present

## 2023-08-01 DIAGNOSIS — Z79899 Other long term (current) drug therapy: Secondary | ICD-10-CM

## 2023-08-01 DIAGNOSIS — G936 Cerebral edema: Secondary | ICD-10-CM | POA: Diagnosis not present

## 2023-08-01 DIAGNOSIS — I10 Essential (primary) hypertension: Secondary | ICD-10-CM | POA: Diagnosis present

## 2023-08-01 DIAGNOSIS — I639 Cerebral infarction, unspecified: Secondary | ICD-10-CM | POA: Diagnosis not present

## 2023-08-01 DIAGNOSIS — Z7982 Long term (current) use of aspirin: Secondary | ICD-10-CM | POA: Diagnosis not present

## 2023-08-01 DIAGNOSIS — I2510 Atherosclerotic heart disease of native coronary artery without angina pectoris: Secondary | ICD-10-CM | POA: Diagnosis present

## 2023-08-01 DIAGNOSIS — Z951 Presence of aortocoronary bypass graft: Secondary | ICD-10-CM

## 2023-08-01 DIAGNOSIS — R29703 NIHSS score 3: Secondary | ICD-10-CM | POA: Diagnosis present

## 2023-08-01 DIAGNOSIS — E785 Hyperlipidemia, unspecified: Secondary | ICD-10-CM | POA: Diagnosis not present

## 2023-08-01 DIAGNOSIS — I480 Paroxysmal atrial fibrillation: Secondary | ICD-10-CM | POA: Diagnosis not present

## 2023-08-01 DIAGNOSIS — R4701 Aphasia: Secondary | ICD-10-CM | POA: Diagnosis not present

## 2023-08-01 DIAGNOSIS — R471 Dysarthria and anarthria: Secondary | ICD-10-CM | POA: Diagnosis not present

## 2023-08-01 DIAGNOSIS — I6389 Other cerebral infarction: Secondary | ICD-10-CM | POA: Diagnosis not present

## 2023-08-01 DIAGNOSIS — Z8673 Personal history of transient ischemic attack (TIA), and cerebral infarction without residual deficits: Secondary | ICD-10-CM | POA: Diagnosis not present

## 2023-08-01 DIAGNOSIS — Z888 Allergy status to other drugs, medicaments and biological substances status: Secondary | ICD-10-CM | POA: Diagnosis not present

## 2023-08-01 DIAGNOSIS — Z823 Family history of stroke: Secondary | ICD-10-CM | POA: Diagnosis not present

## 2023-08-01 DIAGNOSIS — Z885 Allergy status to narcotic agent status: Secondary | ICD-10-CM | POA: Diagnosis not present

## 2023-08-01 DIAGNOSIS — I63512 Cerebral infarction due to unspecified occlusion or stenosis of left middle cerebral artery: Secondary | ICD-10-CM | POA: Diagnosis not present

## 2023-08-01 DIAGNOSIS — Z7902 Long term (current) use of antithrombotics/antiplatelets: Secondary | ICD-10-CM | POA: Diagnosis not present

## 2023-08-01 DIAGNOSIS — I6523 Occlusion and stenosis of bilateral carotid arteries: Secondary | ICD-10-CM | POA: Diagnosis not present

## 2023-08-01 DIAGNOSIS — R414 Neurologic neglect syndrome: Secondary | ICD-10-CM | POA: Diagnosis not present

## 2023-08-01 DIAGNOSIS — R29810 Facial weakness: Secondary | ICD-10-CM | POA: Diagnosis not present

## 2023-08-01 DIAGNOSIS — R519 Headache, unspecified: Secondary | ICD-10-CM | POA: Diagnosis not present

## 2023-08-01 DIAGNOSIS — Z8249 Family history of ischemic heart disease and other diseases of the circulatory system: Secondary | ICD-10-CM

## 2023-08-01 LAB — COMPREHENSIVE METABOLIC PANEL
ALT: 29 U/L (ref 0–44)
AST: 25 U/L (ref 15–41)
Albumin: 4.4 g/dL (ref 3.5–5.0)
Alkaline Phosphatase: 39 U/L (ref 38–126)
Anion gap: 15 (ref 5–15)
BUN: 11 mg/dL (ref 8–23)
CO2: 25 mmol/L (ref 22–32)
Calcium: 10 mg/dL (ref 8.9–10.3)
Chloride: 97 mmol/L — ABNORMAL LOW (ref 98–111)
Creatinine, Ser: 1.3 mg/dL — ABNORMAL HIGH (ref 0.61–1.24)
GFR, Estimated: 58 mL/min — ABNORMAL LOW (ref >60)
Glucose, Bld: 123 mg/dL — ABNORMAL HIGH (ref 70–99)
Potassium: 4.1 mmol/L (ref 3.5–5.1)
Sodium: 137 mmol/L (ref 135–145)
Total Bilirubin: 1 mg/dL (ref 0.3–1.2)
Total Protein: 8 g/dL (ref 6.5–8.1)

## 2023-08-01 LAB — I-STAT CHEM 8, ED
BUN: 12 mg/dL (ref 8–23)
Calcium, Ion: 1.16 mmol/L (ref 1.15–1.40)
Chloride: 101 mmol/L (ref 98–111)
Creatinine, Ser: 1.2 mg/dL (ref 0.61–1.24)
Glucose, Bld: 121 mg/dL — ABNORMAL HIGH (ref 70–99)
HCT: 48 % (ref 39.0–52.0)
Hemoglobin: 16.3 g/dL (ref 13.0–17.0)
Potassium: 4.1 mmol/L (ref 3.5–5.1)
Sodium: 137 mmol/L (ref 135–145)
TCO2: 24 mmol/L (ref 22–32)

## 2023-08-01 LAB — DIFFERENTIAL
Abs Immature Granulocytes: 0.03 10*3/uL (ref 0.00–0.07)
Basophils Absolute: 0 10*3/uL (ref 0.0–0.1)
Basophils Relative: 0 %
Eosinophils Absolute: 0.7 10*3/uL — ABNORMAL HIGH (ref 0.0–0.5)
Eosinophils Relative: 7 %
Immature Granulocytes: 0 %
Lymphocytes Relative: 19 %
Lymphs Abs: 1.7 10*3/uL (ref 0.7–4.0)
Monocytes Absolute: 0.8 10*3/uL (ref 0.1–1.0)
Monocytes Relative: 8 %
Neutro Abs: 5.9 10*3/uL (ref 1.7–7.7)
Neutrophils Relative %: 66 %

## 2023-08-01 LAB — CBG MONITORING, ED: Glucose-Capillary: 118 mg/dL — ABNORMAL HIGH (ref 70–99)

## 2023-08-01 LAB — CBC
HCT: 45.9 % (ref 39.0–52.0)
Hemoglobin: 15.9 g/dL (ref 13.0–17.0)
MCH: 31.9 pg (ref 26.0–34.0)
MCHC: 34.6 g/dL (ref 30.0–36.0)
MCV: 92.2 fL (ref 80.0–100.0)
Platelets: 208 10*3/uL (ref 150–400)
RBC: 4.98 MIL/uL (ref 4.22–5.81)
RDW: 12.3 % (ref 11.5–15.5)
WBC: 9.1 10*3/uL (ref 4.0–10.5)
nRBC: 0 % (ref 0.0–0.2)

## 2023-08-01 LAB — APTT: aPTT: 27 seconds (ref 24–36)

## 2023-08-01 LAB — PROTIME-INR
INR: 1 (ref 0.8–1.2)
Prothrombin Time: 13.5 seconds (ref 11.4–15.2)

## 2023-08-01 LAB — MRSA NEXT GEN BY PCR, NASAL: MRSA by PCR Next Gen: NOT DETECTED

## 2023-08-01 LAB — ETHANOL: Alcohol, Ethyl (B): <10 mg/dL (ref <10)

## 2023-08-01 MED ORDER — STROKE: EARLY STAGES OF RECOVERY BOOK
Freq: Once | Status: AC
  Filled 2023-08-01: qty 1

## 2023-08-01 MED ORDER — SODIUM CHLORIDE 0.9 % IV SOLN: INTRAVENOUS | Status: DC

## 2023-08-01 MED ORDER — PANTOPRAZOLE SODIUM 40 MG IV SOLR
40.0000 mg | Freq: Every day | INTRAVENOUS | Status: DC
  Administered 2023-08-01: 40 mg via INTRAVENOUS
  Filled 2023-08-01: qty 10

## 2023-08-01 MED ORDER — IOHEXOL 350 MG/ML SOLN
75.0000 mL | Freq: Once | INTRAVENOUS | Status: AC | PRN
  Administered 2023-08-01: 75 mL via INTRAVENOUS

## 2023-08-01 MED ORDER — LABETALOL HCL 5 MG/ML IV SOLN: 20.0000 mg | Freq: Once | INTRAVENOUS | Status: DC

## 2023-08-01 MED ORDER — ACETAMINOPHEN 160 MG/5ML PO SOLN: 650.0000 mg | ORAL | Status: DC | PRN

## 2023-08-01 MED ORDER — TENECTEPLASE FOR STROKE
0.2500 mg/kg | PACK | Freq: Once | INTRAVENOUS | Status: AC
  Administered 2023-08-01: 19 mg via INTRAVENOUS
  Filled 2023-08-01: qty 10

## 2023-08-01 MED ORDER — ACETAMINOPHEN 650 MG RE SUPP: 650.0000 mg | RECTAL | Status: DC | PRN

## 2023-08-01 MED ORDER — ROSUVASTATIN CALCIUM 20 MG PO TABS
20.0000 mg | ORAL_TABLET | Freq: Every day | ORAL | Status: DC
  Administered 2023-08-02 – 2023-08-05 (×4): 20 mg via ORAL
  Filled 2023-08-01 (×4): qty 1

## 2023-08-01 MED ORDER — ACETAMINOPHEN 325 MG PO TABS
650.0000 mg | ORAL_TABLET | ORAL | Status: DC | PRN
  Administered 2023-08-02: 650 mg via ORAL
  Filled 2023-08-01: qty 2

## 2023-08-01 MED ORDER — CLEVIDIPINE BUTYRATE 0.5 MG/ML IV EMUL: 0.0000 mg/h | INTRAVENOUS | Status: DC

## 2023-08-01 MED ORDER — SENNOSIDES-DOCUSATE SODIUM 8.6-50 MG PO TABS: 1.0000 | ORAL_TABLET | Freq: Every evening | ORAL | Status: DC | PRN

## 2023-08-01 NOTE — Telephone Encounter (Signed)
Final Outcome: Call EMS Now. Patient stated he would have his wife drive him to the ED. Per chart, patient went to the ED and has been admitted to the hospital.     Patient Name First: Ethan Fearing Last: Payne Gender: Male DOB: [birth year] Age: 77 Y 9 M 9 D Return Phone Number: [phone] (Primary) Address: City/ State/ Zip: [place]  [address] Client [facility] - Administrator, [facility] Provider Tana Conch- MD Contact Type Call Who Is Calling Patient / Member / Family / Caregiver Call Type Triage / Clinical Relationship To Patient Self Return Phone Number [phone] (Primary) Chief Complaint Dizziness Reason for Call Symptomatic / Request for Health Information Initial Comment Caller says he thinks he his having stroke feeling dizziness. He says that is the only symptom Translation No Nurse Assessment Nurse: Zena Amos, RN, Claris Che Date/Time (Eastern Time): 08/01/2023 11:31:21 AM Confirm and document reason for call. If symptomatic, describe symptoms. ---Caller states last night he experienced some dizziness and it went away but it has come back today. Caller is having a headache right now rated 5/10. Does the patient have any new or worsening symptoms? ---Yes Will a triage be completed? ---Yes Related visit to physician within the last 2 weeks? ---No Does the PT have any chronic conditions? (i.e. diabetes, asthma, this includes High risk factors for pregnancy, etc.) ---Yes List chronic conditions. ---HTN, high cholesterol, heart condition Is this a behavioral health or substance abuse call? ---No Guidelines Guideline Title Affirmed Question Affirmed Notes Nurse Date/Time (Eastern Time) Headache Difficult to awaken or acting confused (e.g., disoriented, slurred speech) Zena Amos, RN, Margaret 08/01/2023 11:33:29 AM Disp. Time Lamount Cohen Time) Disposition Final User 08/01/2023 11:34:51  AM Call EMS 911 Now Yes Vassallo, RN, Rockne Menghini. Time Lamount Cohen Time) Disposition Final User 08/01/2023 11:41:21 AM 911 Outcome Documentation Vassallo, RN, Claris Che Reason: Jethro Bolus is going to have his wife drive him instead of calling 911 Final Disposition 08/01/2023 11:34:51 AM Call EMS 911 Now Yes Vassallo, RN, Ruel Favors Disagree/Comply Comply Caller Understands Yes PreDisposition InappropriateToAsk Care Advice Given Per Guideline CALL EMS 911 NOW: * Immediate medical attention is needed. You need to hang up and call 911 (or an ambulance). * Triager Discretion: I'll call you back in a few minutes to be sure you were able to reach them. CARE ADVICE given per Headache (Adult) guideline.

## 2023-08-01 NOTE — Telephone Encounter (Signed)
Agree with Emergency Department as soon as possible

## 2023-08-01 NOTE — Telephone Encounter (Signed)
FYI

## 2023-08-01 NOTE — ED Notes (Signed)
EDP in triage to evaluate patient.

## 2023-08-01 NOTE — Telephone Encounter (Signed)
I spoke with Mrs. Ethan Payne and she understood Dr. Wynn Banker relpy. Task completed.

## 2023-08-01 NOTE — Consult Note (Addendum)
NEUROLOGY CONSULTATION NOTE   Date of service: August 01, 2023 Patient Name: Ethan Payne MRN:  [id number] DOB:  Jun 28, 1946 Reason for consult: "code stroke" Requesting Provider: Durwin Glaze, MD  History of Present Illness  Ethan Payne is a 77 y.o. male with PMH significant for L MCA stroke s/p TNK in May 24, HTN, HLD who presents to ED c/o transient episodes of headache, confusion, task-specific apraxia and aphasia. Per wife, he had this presentation around 1030 today and yesterday at 1200.  On exam, he is disoriented to the month with expressive aphasia and right-side neglect. NIH 3.  LKW: 1030 mRS: 0 tNKASE: yes, given at 1343 Thrombectomy: No, no LVO  NIHSS components Score: Comment  1a Level of Conscious 0[x]  1[]  2[]  3[]      1b LOC Questions 0[x]  1[]  2[]       1c LOC Commands 0[x]  1[]  2[]       2 Best Gaze 0[x]  1[]  2[]       3 Visual 0[x]  1[]  2[]  3[]      4 Facial Palsy 0[x]  1[]  2[]  3[]      5a Motor Arm - left 0[x]  1[]  2[]  3[]  4[]  UN[]    5b Motor Arm - Right 0[x]  1[]  2[]  3[]  4[]  UN[]    6a Motor Leg - Left 0[x]  1[]  2[]  3[]  4[]  UN[]    6b Motor Leg - Right 0[x]  1[]  2[]  3[]  4[]  UN[]    7 Limb Ataxia 0[x]  1[]  2[]  3[]  UN[]     8 Sensory 0[x]  1[]  2[]  UN[]      9 Best Language 0[x]  1[x]  2[]  3[]      10 Dysarthria 0[x]  1[]  2[]  UN[]      11 Extinct. and Inattention 0[x]  1[x]  2[]       TOTAL:  2       ROS   Constitutional Denies weight loss, fever and chills.   HEENT Denies changes in vision and hearing.   Respiratory Denies SOB and cough.   CV Denies palpitations and CP   GI Denies abdominal pain, nausea, vomiting and diarrhea.   GU Denies dysuria and urinary frequency.   MSK Denies myalgia and joint pain.   Skin Denies rash and pruritus.   Neurological Denies headache and syncope.   Psychiatric Denies recent changes in mood. Denies anxiety and depression.    Past History   Past Medical History:  Diagnosis Date   Acute prostatitis 02/04/2008   ATRIAL MYXOMA 02/04/2008    Closed fracture of four ribs 11/01/2009   HYPERLIPIDEMIA 02/04/2008   HYPERTENSION 02/04/2008   Osteoarthritis of right knee 04/18/2010   Past Surgical History:  Procedure Laterality Date   CATARACT EXTRACTION Left 08/03/2019   CORONARY ARTERY BYPASS GRAFT  2007   x2 Saph vein   COX-MAZE MICROWAVE ABLATION  2007   EXCISION OF ATRIAL MYXOMA  2007   Dr C. Cornelius Moras   Family History  Problem Relation Age of Onset   Stroke Mother    Heart disease Father        MI 66   Hypertension Father    Ovarian cancer Sister    Social History   Socioeconomic History   Marital status: Married    Spouse name: Not on file   Number of children: Not on file   Years of education: Not on file   Highest education level: Not on file  Occupational History   Occupation: married  Tobacco Use   Smoking status: Never   Smokeless tobacco: Never  Substance and Sexual Activity   Alcohol use:  No   Drug use: No   Sexual activity: Yes  Other Topics Concern   Not on file  Social History Narrative   Married will be 50 years in 2019 (wife seen at another practice). 2 children-daughter unmarried, professional student 1 and son 89 in 2016. 2 grandsons-17 and 55 in 2019.       Still works as Product/process development scientist, does his own carpentry work      Hobbies: fishing   Social Determinants of Corporate investment banker Strain: Low Risk  (03/24/2023)   Overall Financial Resource Strain (CARDIA)    Difficulty of Paying Living Expenses: Not hard at all  Food Insecurity: No Food Insecurity (04/23/2023)   Hunger Vital Sign    Worried About Running Out of Food in the Last Year: Never true    Ran Out of Food in the Last Year: Never true  Transportation Needs: No Transportation Needs (04/23/2023)   PRAPARE - Administrator, Civil Service (Medical): No    Lack of Transportation (Non-Medical): No  Physical Activity: Insufficiently Active (03/24/2023)   Exercise Vital Sign    Days of Exercise per Week: 3 days     Minutes of Exercise per Session: 30 min  Stress: No Stress Concern Present (03/24/2023)   [employer] of Occupational Health - Occupational Stress Questionnaire    Feeling of Stress : Not at all  Social Connections: Moderately Isolated (03/24/2023)   Social Connection and Isolation Panel [NHANES]    Frequency of Communication with Friends and Family: More than three times a week    Frequency of Social Gatherings with Friends and Family: More than three times a week    Attends Religious Services: Never    Database administrator or Organizations: No    Attends Banker Meetings: Never    Marital Status: Married   Allergies  Allergen Reactions   Hydrocodone Palpitations    After rib fractures. Hydrocodone-States had shortness of breath, 911 was called After rib fractures. Hydrocodone-States had shortness of breath, 911 was called   Loratadine Other (See Comments) and Anxiety    Heart speeds up Other reaction(s): Other (See Comments) Heart speeds up    Medications  (Not in a hospital admission)    Vitals   Vitals:   08/01/23 1227  BP: (!) 173/101  Pulse: (!) 101  Resp: 17  Temp: 98.4 F (36.9 C)  TempSrc: Oral  SpO2: 98%     There is no height or weight on file to calculate BMI.  Physical Exam   General: Laying comfortably in bed; in no acute distress.  HENT: Normal oropharynx and mucosa. Normal external appearance of ears and nose.  Neck: Supple, no pain or tenderness  CV: No JVD. No peripheral edema.  Pulmonary: Symmetric Chest rise. Normal respiratory effort.  Abdomen: Soft to touch, non-tender.  Ext: No cyanosis, edema, or deformity  Skin: No rash. Normal palpation of skin.   Musculoskeletal: Normal digits and nails by inspection.   Neurologic Examination  Mental status/Cognition: Alert, disoriented to age and month.  Speech/language: Mild expressive aphasia present. Cranial nerves:   CN II Pupils equal and reactive to light, no VF deficits     CN III,IV,VI EOM intact, no gaze preference or deviation, no nystagmus    CN V normal sensation in V1, V2, and V3 segments bilaterally    CN VII no asymmetry, no nasolabial fold flattening    CN VIII normal hearing to speech  CN IX & X normal palatal elevation, no uvular deviation    CN XI 5/5 head turn and 5/5 shoulder shrug bilaterally    CN XII midline tongue protrusion    Motor:  Normal bulk and tone 5/5 in all extremities. No focal weakness or drift seen.   Sensation: Extinction to DSS on right  Coordination/Complex Motor:  - Finger to Nose: intact  - Heel to shin: intact, no ataxia  - Gait: deferred  Labs   CBC:  Recent Labs  Lab 08/01/23 1314  HGB 16.3  HCT 48.0    Basic Metabolic Panel:  Lab Results  Component Value Date   NA 137 08/01/2023   K 4.1 08/01/2023   CO2 29 06/16/2023   GLUCOSE 121 (H) 08/01/2023   BUN 12 08/01/2023   CREATININE 1.20 08/01/2023   CALCIUM 9.8 06/16/2023   GFRNONAA >60 04/28/2023   GFRAA  10/19/2009    >60        The eGFR has been calculated using the MDRD equation. This calculation has not been validated in all clinical situations. eGFR's persistently <60 mL/min signify possible Chronic Kidney Disease.   Lipid Panel:  Lab Results  Component Value Date   LDLCALC 219 (H) 04/22/2023   HgbA1c:  Lab Results  Component Value Date   HGBA1C 6.1 (H) 04/21/2023   Urine Drug Screen: No results found for: "LABOPIA", "COCAINSCRNUR", "LABBENZ", "AMPHETMU", "THCU", "LABBARB"  Alcohol Level     Component Value Date/Time   ETH <10 04/21/2023 1800    CT Head without contrast(Personally reviewed): - suspected small acute infarct in the left frontal lobe - ASPECTS 9  CT angio Head and Neck with contrast(Personally reviewed): - patent vasculature of head and neck with mild plaque - No LVO or significant stenosis, occlusion or dissection   Impression   KIE BIERLY is a 77 y.o. male with PMH significant for L MCA  stroke s/p TNK in May 24, HTN, HLD who presents to ED c/o transient episodes of headache, confusion, task-specific apraxia and aphasia.   Primary Diagnosis:  Cerebral infarction, unspecified.  Secondary Diagnosis: Essential (primary) hypertension  Recommendations  - Frequent Neuro checks per stroke unit protocol - MRI Brain 24 hr post-TNK stroke protocol - TTE - Lipid panel - Statin - will be started if LDL>70 or otherwise medically indicated - A1C - Antithrombotic - Hold until 24h post-TNK imaging is stable - DVT ppx - SCDs - Smoking cessation - will counsel patient - SBP goal - <180/105, PRN labetalol if HR>60 and PRN Hydralazine if HR<60 - Telemetry monitoring for arrhythmia - 72h - Swallow screen - will be performed prior to PO intake - Stroke education - will be given - PT/OT/SLP - Dispo: ICU  ______________________________________________________________________   NEUROHOSPITALIST ADDENDUM Performed a face to face diagnostic evaluation.   I have reviewed the contents of history and physical exam as documented by PA/ARNP/Resident and agree with above documentation.  I have discussed and formulated the above plan as documented. Edits to the note have been made as needed.  Impression/Key exam findings/Plan: presenting with aphasia that is new and sudden in onset. Had a few hours episode yesterday started around noon but that resolved. This AM, woke up at baseline. Around 1030AM, had aphasia that persisted and so brought in to the ED after talking to their PCP. He can identify objects, answer short phrases or single words. However, he is unable to carry on a conversation. When asked to describe what is going on in  a picture, he initially was able to put words together, followed by word salad/garbled speech and nonsensical.  I felt that it was hard to be sure if this truly is disabling. Personally I felt this was moderately disabling and frustrating for the patient so I  extensively discussed risks and benefits of tnkase and offered tnkase. His last scattered LMCA stroke was on 04/21/23 and thus he is more than 3 months from his last stroke. He also did not have any residual deficit from his prior stroke so felt recrudescence was unlikely. I stated that they should only opt for tnkase if they feel that his symptoms are significantly disabling. After much discussion between his wife, daughter and son, they opted to go for tnkase. With the fact that patient has aphasia, he unfortunately would not be able to ask relevant questions or express clear understanding and thus family used as surrogate decision makers. Given the extensive amount of discussion that I had with family and amongst family members, there was delay to tnkase mixing and administration.  He did not have a LVO and thus not a candidate for thrombectomy.  Allergies verified with wife and updated in chart. Code status verified with wife and he is FULL CODE.  This patient is critically ill and at significant risk of neurological worsening, death and care requires constant monitoring of vital signs, hemodynamics,respiratory and cardiac monitoring, neurological assessment, discussion with family, other specialists and medical decision making of high complexity. I spent 100 minutes of neurocritical care time  in the care of  this patient. This was time spent independent of any time provided by nurse practitioner or PA.  Erick Blinks [facility] 08/01/2023  5:45 PM   Erick Blinks, MD [facility] [phone]   If 7pm to 7am, please call on call as listed on AMION.

## 2023-08-01 NOTE — ED Provider Notes (Signed)
[place] EMERGENCY DEPARTMENT AT [facility] Provider Note   CSN: [id number] Arrival date & time: 08/01/23  1221  An emergency department physician performed an initial assessment on this suspected stroke patient at 1302.  History  Chief Complaint  Patient presents with   Aphasia    Ethan Payne is a 77 y.o. male.  77 year old male with past medical history of CVA and hyperlipidemia presents the emergency department today with aphasia.  This began at 10 AM this morning.  The patient woke up in his normal state of health.  He did have an episode yesterday of aphasia that resolved on its own.  The patient was in his normal state of health when he went to bed last night.  His wife states that he was having difficulty getting his words out this morning.  He denies any focal weakness, numbness, or tingling.  The patient denies any significant residual deficits from his previous CVA.  Patient did report a headache at the time this morning.  This started at 10 AM.        Home Medications Prior to Admission medications   Medication Sig Start Date End Date Taking? Authorizing Provider  amLODipine (NORVASC) 5 MG tablet Take 1 tablet (5 mg total) by mouth daily. Patient taking differently: Take 2.5 mg by mouth daily. 03/24/23   Shelva Majestic, MD  aspirin EC 81 MG tablet Take 1 tablet (81 mg total) by mouth daily. Swallow whole. 04/28/23   Setzer, Lynnell Jude, PA-C  calcium carbonate (TUMS - DOSED IN MG ELEMENTAL CALCIUM) 500 MG chewable tablet Chew 2 tablets by mouth at bedtime.    [provider]  diclofenac Sodium (VOLTAREN) 1 % GEL Apply 2 g topically 4 (four) times daily. 04/28/23   Setzer, Lynnell Jude, PA-C  lisinopril (ZESTRIL) 20 MG tablet Take 1 tablet (20 mg total) by mouth daily. 06/16/23   Shelva Majestic, MD  Menthol-Methyl Salicylate (MUSCLE RUB) 10-15 % CREA Apply 1 Application topically 2 (two) times daily as needed for muscle pain. Patient not taking:  Reported on 06/16/2023 04/28/23   Milinda Antis, PA-C  rosuvastatin (CRESTOR) 20 MG tablet Take 1 tablet (20 mg total) by mouth daily. 06/16/23   Shelva Majestic, MD      Allergies    Hydrocodone and Loratadine    Review of Systems   Review of Systems Gen: No fevers Eyes: No vision changes HEENT: no congestion, sore throat Neck: no neck stiffness Resp: no cough, shortness of breath Card: no chest pain Abd: no nausea or vomiting, no abdominal pain Extremities: no leg swelling Neuro: no weakness, numbness, tingling Skin: no rashes  Physical Exam Updated Vital Signs BP (!) 157/82   Pulse 82   Temp 98.4 F (36.9 C) (Oral)   Resp 13   Wt 76.2 kg   SpO2 100%   BMI 27.11 kg/m  Physical Exam Vitals and nursing note reviewed.   Gen: NAD Eyes: PERRL, EOMI HEENT: no oropharyngeal swelling Neck: trachea midline Resp: clear to auscultation bilaterally Card: RRR, no murmurs, rubs, or gallops Abd: nontender, nondistended Extremities: no calf tenderness, no edema Vascular: 2+ radial pulses bilaterally, 2+ DP pulses bilaterally Neuro: See NIH stroke scale Skin: no rashes Psyc: acting appropriately   ED Results / Procedures / Treatments   Labs (all labs ordered are listed, but only abnormal results are displayed) Labs Reviewed  DIFFERENTIAL - Abnormal; Notable for the following components:      Result Value  Eosinophils Absolute 0.7 (*)    All other components within normal limits  CBG MONITORING, ED - Abnormal; Notable for the following components:   Glucose-Capillary 118 (*)    All other components within normal limits  I-STAT CHEM 8, ED - Abnormal; Notable for the following components:   Glucose, Bld 121 (*)    All other components within normal limits  PROTIME-INR  APTT  CBC  COMPREHENSIVE METABOLIC PANEL  ETHANOL  CBG MONITORING, ED    EKG EKG Interpretation Date/Time:  Friday August 01 2023 13:37:25 EDT Ventricular Rate:  89 PR Interval:  170 QRS  Duration:  99 QT Interval:  384 QTC Calculation: 468 R Axis:   29  Text Interpretation: Sinus rhythm Atrial premature complexes RSR' in V1 or V2, right VCD or RVH Inferior infarct, age indeterminate Confirmed by Beckey Downing [phone]) on 08/01/2023 1:49:14 PM  Radiology CT HEAD CODE STROKE WO CONTRAST  Result Date: 08/01/2023 CLINICAL DATA:  Code stroke.  Aphasia EXAM: CT ANGIOGRAPHY HEAD AND NECK TECHNIQUE: Multidetector CT imaging of the head and neck was performed using the standard protocol during bolus administration of intravenous contrast. Multiplanar CT image reconstructions and MIPs were obtained to evaluate the vascular anatomy. Carotid stenosis measurements (when applicable) are obtained utilizing NASCET criteria, using the distal internal carotid diameter as the denominator. RADIATION DOSE REDUCTION: This exam was performed according to the departmental dose-optimization program which includes automated exposure control, adjustment of the mA and/or kV according to patient size and/or use of iterative reconstruction technique. CONTRAST:  75mL OMNIPAQUE IOHEXOL 350 MG/ML SOLN COMPARISON:  CT head 04/25/2023, CTA head/neck 04/21/2023 FINDINGS: CT HEAD FINDINGS Brain: There is a small region of hypodensity with loss of gray-white differentiation in the left frontal lobe not seen on the prior study suspicious for small acute infarct. There is no other evidence of acute infarct. There is no acute intracranial hemorrhage. Background parenchymal volume is normal. The ventricles are normal in size. Gray-white differentiation is otherwise preserved. Small remote infarcts in both cerebellar hemispheres are again noted. The pituitary and suprasellar region are normal. There is no mass lesion. There is no mass effect or midline shift. Vascular: See below. Skull: Normal. Negative for fracture or focal lesion. Sinuses/Orbits: The paranasal sinuses are clear. Bilateral lens implants are in place. The globes and  orbits are otherwise unremarkable. Other: The mastoid air cells and middle ear cavities are clear. ASPECTS [facility] Stroke Program Early CT Score) - Ganglionic level infarction (caudate, lentiform nuclei, internal capsule, insula, M1-M3 cortex): 7 - Supraganglionic infarction (M4-M6 cortex): 2 Total score (0-10 with 10 being normal): 9 CTA NECK FINDINGS Aortic arch: The imaged aortic arch is normal. The origins of the major branch vessels are patent. The subclavian arteries are patent to the level imaged. Right carotid system: The right common, internal, and external carotid arteries are patent, with mild plaque at the bifurcation but no hemodynamically significant stenosis or occlusion. There is no evidence of dissection or aneurysm. Left carotid system: The left common, internal, and external carotid arteries are patent, with mixed plaque at the bifurcation but no hemodynamically significant stenosis or occlusion. There is no evidence of dissection or aneurysm. Vertebral arteries: The vertebral arteries are patent, without hemodynamically significant stenosis or occlusion. There is no evidence of dissection or aneurysm. Skeleton: There is no acute osseous abnormality or suspicious osseous lesion. Multilevel degenerative change is again seen in the cervical spine with a least moderate spinal canal stenosis at C5-C6. Other neck: The soft tissues  of the neck are unremarkable. Upper chest: The imaged lung apices are clear. Review of the MIP images confirms the above findings CTA HEAD FINDINGS Anterior circulation: The intracranial ICAs are patent. The bilateral MCAs are patent, without proximal stenosis or occlusion. The bilateral ACAS are patent, without proximal stenosis or occlusion. The anterior communicating artery is normal. There is no aneurysm or AVM. Posterior circulation: The bilateral V4 segments are patent. The basilar artery is patent. The major cerebellar arteries appear patent. The bilateral PCAs are  patent, without proximal stenosis or occlusion. There is a fetal origin on the right. There is no aneurysm or AVM. Venous sinuses: Patent. Anatomic variants: As above. Review of the MIP images confirms the above findings IMPRESSION: 1. Suspect small acute infarct in the left frontal lobe. ASPECTS is 9. 2. Patent vasculature of the head and neck with mild plaque at the carotid bifurcations but no hemodynamically significant stenosis, occlusion, or dissection. Findings of the noncontrast head CT communicated to Dr Derry Lory at 1:22 pm. CTA results communicated at 1:30 pm. Electronically Signed   By: Lesia Hausen M.D.   On: 08/01/2023 13:34   CT ANGIO HEAD NECK W WO CM (CODE STROKE)  Result Date: 08/01/2023 CLINICAL DATA:  Code stroke.  Aphasia EXAM: CT ANGIOGRAPHY HEAD AND NECK TECHNIQUE: Multidetector CT imaging of the head and neck was performed using the standard protocol during bolus administration of intravenous contrast. Multiplanar CT image reconstructions and MIPs were obtained to evaluate the vascular anatomy. Carotid stenosis measurements (when applicable) are obtained utilizing NASCET criteria, using the distal internal carotid diameter as the denominator. RADIATION DOSE REDUCTION: This exam was performed according to the departmental dose-optimization program which includes automated exposure control, adjustment of the mA and/or kV according to patient size and/or use of iterative reconstruction technique. CONTRAST:  75mL OMNIPAQUE IOHEXOL 350 MG/ML SOLN COMPARISON:  CT head 04/25/2023, CTA head/neck 04/21/2023 FINDINGS: CT HEAD FINDINGS Brain: There is a small region of hypodensity with loss of gray-white differentiation in the left frontal lobe not seen on the prior study suspicious for small acute infarct. There is no other evidence of acute infarct. There is no acute intracranial hemorrhage. Background parenchymal volume is normal. The ventricles are normal in size. Gray-white differentiation is  otherwise preserved. Small remote infarcts in both cerebellar hemispheres are again noted. The pituitary and suprasellar region are normal. There is no mass lesion. There is no mass effect or midline shift. Vascular: See below. Skull: Normal. Negative for fracture or focal lesion. Sinuses/Orbits: The paranasal sinuses are clear. Bilateral lens implants are in place. The globes and orbits are otherwise unremarkable. Other: The mastoid air cells and middle ear cavities are clear. ASPECTS [facility] Stroke Program Early CT Score) - Ganglionic level infarction (caudate, lentiform nuclei, internal capsule, insula, M1-M3 cortex): 7 - Supraganglionic infarction (M4-M6 cortex): 2 Total score (0-10 with 10 being normal): 9 CTA NECK FINDINGS Aortic arch: The imaged aortic arch is normal. The origins of the major branch vessels are patent. The subclavian arteries are patent to the level imaged. Right carotid system: The right common, internal, and external carotid arteries are patent, with mild plaque at the bifurcation but no hemodynamically significant stenosis or occlusion. There is no evidence of dissection or aneurysm. Left carotid system: The left common, internal, and external carotid arteries are patent, with mixed plaque at the bifurcation but no hemodynamically significant stenosis or occlusion. There is no evidence of dissection or aneurysm. Vertebral arteries: The vertebral arteries are patent, without hemodynamically  significant stenosis or occlusion. There is no evidence of dissection or aneurysm. Skeleton: There is no acute osseous abnormality or suspicious osseous lesion. Multilevel degenerative change is again seen in the cervical spine with a least moderate spinal canal stenosis at C5-C6. Other neck: The soft tissues of the neck are unremarkable. Upper chest: The imaged lung apices are clear. Review of the MIP images confirms the above findings CTA HEAD FINDINGS Anterior circulation: The intracranial ICAs are  patent. The bilateral MCAs are patent, without proximal stenosis or occlusion. The bilateral ACAS are patent, without proximal stenosis or occlusion. The anterior communicating artery is normal. There is no aneurysm or AVM. Posterior circulation: The bilateral V4 segments are patent. The basilar artery is patent. The major cerebellar arteries appear patent. The bilateral PCAs are patent, without proximal stenosis or occlusion. There is a fetal origin on the right. There is no aneurysm or AVM. Venous sinuses: Patent. Anatomic variants: As above. Review of the MIP images confirms the above findings IMPRESSION: 1. Suspect small acute infarct in the left frontal lobe. ASPECTS is 9. 2. Patent vasculature of the head and neck with mild plaque at the carotid bifurcations but no hemodynamically significant stenosis, occlusion, or dissection. Findings of the noncontrast head CT communicated to Dr Derry Lory at 1:22 pm. CTA results communicated at 1:30 pm. Electronically Signed   By: Lesia Hausen M.D.   On: 08/01/2023 13:34    Procedures Procedures    Medications Ordered in ED Medications   stroke: early stages of recovery book (has no administration in time range)  0.9 %  sodium chloride infusion (has no administration in time range)  acetaminophen (TYLENOL) tablet 650 mg (has no administration in time range)    Or  acetaminophen (TYLENOL) 160 MG/5ML solution 650 mg (has no administration in time range)    Or  acetaminophen (TYLENOL) suppository 650 mg (has no administration in time range)  senna-docusate (Senokot-S) tablet 1 tablet (has no administration in time range)  pantoprazole (PROTONIX) injection 40 mg (has no administration in time range)  labetalol (NORMODYNE) injection 20 mg (has no administration in time range)    And  clevidipine (CLEVIPREX) infusion 0.5 mg/mL (has no administration in time range)  iohexol (OMNIPAQUE) 350 MG/ML injection 75 mL (75 mLs Intravenous Contrast Given 08/01/23 1326)   tenecteplase (TNKASE) injection for Stroke 19 mg (19 mg Intravenous Given 08/01/23 1343)    ED Course/ Medical Decision Making/ A&P                   NIH Stroke Scale: 3              Medical Decision Making 77 year old male with past medical history of CVA CVA and hypertension as well as hyperlipidemia presenting to the emergency department today with new onset aphasia.  The patient did have an episode yesterday but returned to his baseline.  He does still seem to have some mild aphasia here.  This started 3 hours prior to arrival.  I will initiate a code stroke on the patient.  Is possible this is due to some other medical cause but given the acute onset I am concerned for CVA.  I will reevaluate for ultimate disposition.  The CT/CT angiogram also evaluate for intracranial hemorrhage.  The patient's EKG interpreted by me shows sinus rhythm with a rate of 89 with normal axis, normal intervals, nonspecific ST-T changes.  The patient was evaluated by neurology.  They did discuss TNK and ultimately the patient did  opt for thrombolytics.  He is given these and will be admitted to the ICU for further evaluation and management.  Amount and/or Complexity of Data Reviewed Labs: ordered. Radiology: ordered.  Risk Decision regarding hospitalization.  Critical care time 36 minutes including reassessments, review of imaging, review of records and coordination of care with neurology         Final Clinical Impression(s) / ED Diagnoses Final diagnoses:  Aphasia  Dispo: admit  Rx / DC Orders ED Discharge Orders     None         Durwin Glaze, MD 08/01/23 1351

## 2023-08-01 NOTE — Progress Notes (Signed)
PHARMACIST CODE STROKE RESPONSE  Notified to mix TNK at 1341 by Dr. Derry Lory TNK preparation completed at 1342  TNK dose = 19 mg IV over 5 seconds.   Issues/delays encountered (if applicable): Consent from family member  Daylene Posey 08/01/23 1:44 PM

## 2023-08-01 NOTE — Progress Notes (Signed)
PT Cancellation Note  Patient Details Name: Ethan Payne MRN: [id number] DOB: 02-21-1946   Cancelled Treatment:    Reason Eval/Treat Not Completed: (P) Active bedrest order. Pt administered tNKASE at 13:43 today. Per PT protocol, will plan to hold off until > 24 hours after receiving tNKASE unless cleared by MD to do otherwise.   [name], PT, DPT Acute Rehabilitation Services  Office: [phone]    [name] 08/01/2023, 5:14 PM

## 2023-08-01 NOTE — Progress Notes (Signed)
Patient's aphasia had improved after TNK. Patient aphasia and some mild extinction in his arms noted on this exam. Noted at arrival, but had gotten better prior to now. MD made aware and patient taken to CT on transportation to [facility] ICU.

## 2023-08-01 NOTE — Telephone Encounter (Signed)
Ethan Payne daughter Ethan Payne left a  message at  1:30pm with questions about a "clot buster". Her Father was having  CT and is currently having a stroke.   I checked EPIC and he has been admitted. She was advised that  the Hospital and Neurology will take care of him. I will inform Dr. Wynn Banker

## 2023-08-01 NOTE — Progress Notes (Signed)
Bedside Handoff given to Terril, Charity fundraiser.

## 2023-08-01 NOTE — H&P (Signed)
Please see my full consult note from earlier today for H and P.  Erick Blinks [facility]

## 2023-08-01 NOTE — ED Triage Notes (Signed)
Patient arrives with family who reports that yesterday at 1400 patient had headache, a rash, and was not using the correct words in sentences. This lasted for an hour and resolved. This morning he had a similar presentation starting at 1200.

## 2023-08-01 NOTE — Telephone Encounter (Signed)
FYI: This call has been transferred to triage nurse: Access Nurse. Once the result note has been entered staff can address the message at that time.  Patient called in with the following symptoms:  Red Word: Lightheadedness--Patient stated he thinks he is having a stroke    Please advise at Mobile [phone] (mobile)  Message is routed to Provider Pool.

## 2023-08-01 NOTE — ED Notes (Signed)
Code Stroke activated by Okey Dupre

## 2023-08-02 ENCOUNTER — Inpatient Hospital Stay (HOSPITAL_COMMUNITY): Payer: Medicare Other

## 2023-08-02 DIAGNOSIS — Z8673 Personal history of transient ischemic attack (TIA), and cerebral infarction without residual deficits: Secondary | ICD-10-CM | POA: Diagnosis not present

## 2023-08-02 DIAGNOSIS — I639 Cerebral infarction, unspecified: Secondary | ICD-10-CM | POA: Diagnosis not present

## 2023-08-02 DIAGNOSIS — I6389 Other cerebral infarction: Secondary | ICD-10-CM | POA: Diagnosis not present

## 2023-08-02 LAB — ECHOCARDIOGRAM COMPLETE
Area-P 1/2: 3.6 cm2
Height: 66 in
S' Lateral: 2.9 cm
Weight: 2670.21 [oz_av]

## 2023-08-02 LAB — LIPID PANEL
Cholesterol: 162 mg/dL (ref 0–200)
HDL: 42 mg/dL (ref >40)
LDL Cholesterol: 101 mg/dL — ABNORMAL HIGH (ref 0–99)
Total CHOL/HDL Ratio: 3.9 ratio
Triglycerides: 94 mg/dL (ref <150)
VLDL: 19 mg/dL (ref 0–40)

## 2023-08-02 LAB — CBC
HCT: 40.6 % (ref 39.0–52.0)
Hemoglobin: 14.3 g/dL (ref 13.0–17.0)
MCH: 31.2 pg (ref 26.0–34.0)
MCHC: 35.2 g/dL (ref 30.0–36.0)
MCV: 88.5 fL (ref 80.0–100.0)
Platelets: 187 10*3/uL (ref 150–400)
RBC: 4.59 MIL/uL (ref 4.22–5.81)
RDW: 12.4 % (ref 11.5–15.5)
WBC: 8.7 10*3/uL (ref 4.0–10.5)
nRBC: 0 % (ref 0.0–0.2)

## 2023-08-02 LAB — BASIC METABOLIC PANEL
Anion gap: 15 (ref 5–15)
BUN: 11 mg/dL (ref 8–23)
CO2: 21 mmol/L — ABNORMAL LOW (ref 22–32)
Calcium: 9 mg/dL (ref 8.9–10.3)
Chloride: 100 mmol/L (ref 98–111)
Creatinine, Ser: 1.16 mg/dL (ref 0.61–1.24)
GFR, Estimated: >60 mL/min (ref >60)
Glucose, Bld: 136 mg/dL — ABNORMAL HIGH (ref 70–99)
Potassium: 3.3 mmol/L — ABNORMAL LOW (ref 3.5–5.1)
Sodium: 136 mmol/L (ref 135–145)

## 2023-08-02 LAB — HEMOGLOBIN A1C
Hgb A1c MFr Bld: 5.9 % — ABNORMAL HIGH (ref 4.8–5.6)
Mean Plasma Glucose: 122.63 mg/dL

## 2023-08-02 MED ORDER — LISINOPRIL 10 MG PO TABS
10.0000 mg | ORAL_TABLET | Freq: Every day | ORAL | Status: DC
  Administered 2023-08-02 – 2023-08-05 (×4): 10 mg via ORAL
  Filled 2023-08-02 (×4): qty 1

## 2023-08-02 MED ORDER — POTASSIUM CHLORIDE CRYS ER 20 MEQ PO TBCR
40.0000 meq | EXTENDED_RELEASE_TABLET | Freq: Once | ORAL | Status: AC
  Administered 2023-08-02: 40 meq via ORAL
  Filled 2023-08-02: qty 2

## 2023-08-02 MED ORDER — EZETIMIBE 10 MG PO TABS
10.0000 mg | ORAL_TABLET | Freq: Every day | ORAL | Status: DC
  Administered 2023-08-02 – 2023-08-05 (×4): 10 mg via ORAL
  Filled 2023-08-02 (×3): qty 1

## 2023-08-02 MED ORDER — CHLORHEXIDINE GLUCONATE CLOTH 2 % EX PADS
6.0000 | MEDICATED_PAD | Freq: Every day | CUTANEOUS | Status: DC
  Administered 2023-08-02 – 2023-08-03 (×2): 6 via TOPICAL

## 2023-08-02 MED ORDER — CLOPIDOGREL BISULFATE 75 MG PO TABS
75.0000 mg | ORAL_TABLET | Freq: Every day | ORAL | Status: DC
  Administered 2023-08-02 – 2023-08-05 (×4): 75 mg via ORAL
  Filled 2023-08-02 (×4): qty 1

## 2023-08-02 MED ORDER — ASPIRIN 81 MG PO TBEC
81.0000 mg | DELAYED_RELEASE_TABLET | Freq: Every day | ORAL | Status: DC
  Administered 2023-08-02 – 2023-08-05 (×4): 81 mg via ORAL
  Filled 2023-08-02 (×4): qty 1

## 2023-08-02 MED ORDER — POTASSIUM CHLORIDE 10 MEQ/100ML IV SOLN
10.0000 meq | INTRAVENOUS | Status: DC
  Administered 2023-08-02: 10 meq via INTRAVENOUS
  Filled 2023-08-02 (×4): qty 100

## 2023-08-02 NOTE — Progress Notes (Addendum)
STROKE TEAM PROGRESS NOTE   BRIEF HPI Mr. Ethan Payne is a 77 y.o. male with history of left MCA stroke status post TNK in May, hypertension and hyperlipidemia presenting with acute onset headache, confusion, toxic specific apraxia and aphasia.  According to his wife, he had a similar presentation of symptoms the day before admission which resolved spontaneously.  Patient was given TNK, and symptoms did improve.  MRI was positive for multiple small acute infarcts in left frontal and parietal lobes.  After patient's previous stroke, he was initially supposed to have a 30-day cardiac monitor, but this did not happen.  Plan is for loop recorder implantation this admission to monitor for A-fib given recurrent strokes.   SIGNIFICANT HOSPITAL EVENTS 8/30 TNK given  INTERIM HISTORY/SUBJECTIVE Patient is seen in his room with 1 family member at the bedside.  He has been hemodynamically stable overnight, and speech is now clear and fluent without confusion or dysarthria.  MRI demonstrates multiple small acute infarcts in left frontal and parietal lobes.   OBJECTIVE  CBC    Component Value Date/Time   WBC 8.7 08/02/2023 0408   RBC 4.59 08/02/2023 0408   HGB 14.3 08/02/2023 0408   HCT 40.6 08/02/2023 0408   PLT 187 08/02/2023 0408   MCV 88.5 08/02/2023 0408   MCH 31.2 08/02/2023 0408   MCHC 35.2 08/02/2023 0408   RDW 12.4 08/02/2023 0408   LYMPHSABS 1.7 08/01/2023 1310   MONOABS 0.8 08/01/2023 1310   EOSABS 0.7 (H) 08/01/2023 1310   BASOSABS 0.0 08/01/2023 1310    BMET    Component Value Date/Time   NA 136 08/02/2023 0408   K 3.3 (L) 08/02/2023 0408   CL 100 08/02/2023 0408   CO2 21 (L) 08/02/2023 0408   GLUCOSE 136 (H) 08/02/2023 0408   BUN 11 08/02/2023 0408   CREATININE 1.16 08/02/2023 0408   CALCIUM 9.0 08/02/2023 0408   GFRNONAA >60 08/02/2023 0408    IMAGING past 24 hours MR BRAIN WO CONTRAST  Result Date: 08/02/2023 CLINICAL DATA:  Stroke, follow up EXAM: MRI HEAD  WITHOUT CONTRAST TECHNIQUE: Multiplanar, multiecho pulse sequences of the brain and surrounding structures were obtained without intravenous contrast. COMPARISON:  CT head 08/01/2023. FINDINGS: Brain: Multiple small acute infarcts in the left frontal and parietal lobes. Mild edema without significant mass effect. No evidence of acute hemorrhage, mass lesion, midline shift or hydrocephalus. Remote bilateral cerebellar infarcts. Vascular: Major arterial flow voids are maintained. Skull and upper cervical spine: Normal marrow signal. Sinuses/Orbits: Clear sinuses.  No acute orbital findings. Other: No mastoid effusions. IMPRESSION: Multiple small acute infarcts in the left frontal and parietal lobes. Electronically Signed   By: Feliberto Harts M.D.   On: 08/02/2023 12:03   ECHOCARDIOGRAM COMPLETE  Result Date: 08/02/2023    ECHOCARDIOGRAM REPORT   Patient Name:   Ethan Payne Date of Exam: 08/02/2023 Medical Rec #:  [id number]       Height:       66.0 in Accession #:    [id number]      Weight:       166.9 lb Date of Birth:  05/15/1946      BSA:          1.852 m Patient Age:    76 years        BP:           147/80 mmHg Patient Gender: M  HR:           83 bpm. Exam Location:  Inpatient Procedure: 2D Echo, Cardiac Doppler and Color Doppler Indications:    Stroke I63.9  History:        Patient has prior history of Echocardiogram examinations, most                 recent 04/22/2023. CAD; Risk Factors:Hypertension and                 Dyslipidemia. ATRIAL MYXOMA (From Hx).  Sonographer:    Celesta Gentile RCS Referring Phys: [id number] Lynnae January IMPRESSIONS  1. Left ventricular ejection fraction, by estimation, is 60 to 65%. The left ventricle has normal function. The left ventricle has no regional wall motion abnormalities. There is mild left ventricular hypertrophy. Indeterminate diastolic filling due to E-A fusion.  2. Right ventricular systolic function is normal. The right ventricular size is normal.  Tricuspid regurgitation signal is inadequate for assessing PA pressure.  3. Left atrial size was mild to moderately dilated.  4. No evidence of mitral valve regurgitation.  5. Aortic valve regurgitation is trivial. Aortic valve sclerosis/calcification is present, without any evidence of aortic stenosis.  6. The inferior vena cava is normal in size with greater than 50% respiratory variability, suggesting right atrial pressure of 3 mmHg. Comparison(s): No significant change from prior study. FINDINGS  Left Ventricle: Left ventricular ejection fraction, by estimation, is 60 to 65%. The left ventricle has normal function. The left ventricle has no regional wall motion abnormalities. The left ventricular internal cavity size was normal in size. There is  mild left ventricular hypertrophy. Indeterminate diastolic filling due to E-A fusion. Right Ventricle: The right ventricular size is normal. Right ventricular systolic function is normal. Tricuspid regurgitation signal is inadequate for assessing PA pressure. Left Atrium: Left atrial size was mild to moderately dilated. Right Atrium: Right atrial size was normal in size. Pericardium: There is no evidence of pericardial effusion. Mitral Valve: No evidence of mitral valve regurgitation. Tricuspid Valve: Tricuspid valve regurgitation is not demonstrated. Aortic Valve: Aortic valve regurgitation is trivial. Aortic valve sclerosis/calcification is present, without any evidence of aortic stenosis. Pulmonic Valve: Pulmonic valve regurgitation is not visualized. Aorta: The aortic root and ascending aorta are structurally normal, with no evidence of dilitation. Venous: The inferior vena cava is normal in size with greater than 50% respiratory variability, suggesting right atrial pressure of 3 mmHg. IAS/Shunts: No atrial level shunt detected by color flow Doppler.  LEFT VENTRICLE PLAX 2D LVIDd:         4.30 cm   Diastology LVIDs:         2.90 cm   LV e' medial:    8.49 cm/s LV  PW:         1.20 cm   LV E/e' medial:  13.9 LV IVS:        1.30 cm   LV e' lateral:   9.25 cm/s LVOT diam:     2.10 cm   LV E/e' lateral: 12.8 LV SV:         76 LV SV Index:   41 LVOT Area:     3.46 cm  RIGHT VENTRICLE RV S prime:     12.60 cm/s TAPSE (M-mode): 1.8 cm LEFT ATRIUM             Index        RIGHT ATRIUM           Index LA diam:  4.30 cm 2.32 cm/m   RA Area:     17.20 cm LA Vol (A2C):   44.9 ml 24.25 ml/m  RA Volume:   46.40 ml  25.06 ml/m LA Vol (A4C):   75.8 ml 40.93 ml/m LA Biplane Vol: 59.5 ml 32.13 ml/m  AORTIC VALVE LVOT Vmax:   93.30 cm/s LVOT Vmean:  61.700 cm/s LVOT VTI:    0.220 m  AORTA Ao Root diam: 3.50 cm MITRAL VALVE MV Area (PHT): 3.60 cm     SHUNTS MV Decel Time: 211 msec     Systemic VTI:  0.22 m MV E velocity: 118.00 cm/s  Systemic Diam: 2.10 cm MV A velocity: 57.00 cm/s MV E/A ratio:  2.07 Photographer signed by Carolan Clines Signature Date/Time: 08/02/2023/11:43:16 AM    Final    CT HEAD WO CONTRAST ( )  Result Date: 08/01/2023 CLINICAL DATA:  Headache, rash EXAM: CT HEAD WITHOUT CONTRAST TECHNIQUE: Contiguous axial images were obtained from the base of the skull through the vertex without intravenous contrast. RADIATION DOSE REDUCTION: This exam was performed according to the departmental dose-optimization program which includes automated exposure control, adjustment of the mA and/or kV according to patient size and/or use of iterative reconstruction technique. COMPARISON:  08/01/2023 at 1:21 p.m. FINDINGS: Brain: The small area of left frontal subcortical hypodensity seen on prior exam is again identified, best seen on image 51/6. No new areas of infarct or hemorrhage. Lateral ventricles and midline structures are unremarkable. No acute extra-axial fluid collections. No mass effect. Vascular: Residual intravascular contrast from prior CT angiography. Skull: Normal. Negative for fracture or focal lesion. Sinuses/Orbits: No acute finding. Other: None.  IMPRESSION: 1. Stable small focal subcortical hypodensity left frontal lobe, which could reflect an acute infarct. 2. No new areas of infarct or hemorrhage. Electronically Signed   By: Sharlet Salina M.D.   On: 08/01/2023 15:35    Vitals:   08/02/23 1200 08/02/23 1205 08/02/23 1300 08/02/23 1305  BP: (!) 142/124 (!) 156/91 (!) 163/139 (!) 153/86  Pulse: 80 76 85 82  Resp: (!) 21 18 (!) 21 (!) 21  Temp:      TempSrc:      SpO2: 95% 96% 96% 96%  Weight:      Height:         PHYSICAL EXAM General:  Alert, well-nourished, well-developed patient in no acute distress Psych:  Mood and affect appropriate for situation CV: Regular rate and rhythm on monitor Respiratory:  Regular, unlabored respirations on room air  NEURO:  Mental Status: AA&Ox3, patient is able to give clear and coherent history Speech/Language: speech is without dysarthria or aphasia.  Naming, repetition, fluency, and comprehension intact.  Cranial Nerves:  II: PERRL. Visual fields full.  III, IV, VI: EOMI. Eyelids elevate symmetrically.  V: Sensation is intact to light touch and symmetrical to face.  VII: Subtle right facial droop VIII: hearing intact to voice. IX, X: Phonation is normal.  XII: tongue is midline without fasciculations. Motor: 5/5 strength to all muscle groups tested.  Tone: is normal and bulk is normal Sensation- Intact to light touch bilaterally.  Coordination: FTN intact bilaterally.  No drift.  Gait- deferred   ASSESSMENT/PLAN  Acute Ischemic Infarct:  left MCA small infarcts s/p TNK, etiology: Likely cardioembolic, concerning occult afib Code Stroke CT head suspected small acute infarct in left frontal lobe. ASPECTS 9 CTA head & neck no LVO or hemodynamically significant stenosis MRI multiple small acute infarcts in left frontal and parietal lobes  2D Echo EF 60 to 65%, mild LVH, left atrium mildly to moderately dilated, no atrial level shunt LDL 101 HgbA1c 5.9 UDS pending Recommend loop  recorder prior to discharge VTE prophylaxis - SCDs aspirin 81 mg daily prior to admission, now on ASA and plavix DAPT for 3 months and then plavix alone. Therapy recommendations: Outpatient PT Disposition: Pending  Hx of Stroke/TIA 04/2023 admitted for right-sided weakness.  CT no acute abnormality.  Status post TNK.  CTA head and neck no LVO.  MRI showed left pre and postcentral gyrus infarcts.  EF 55 to 60%.  LDL 219, A1c 6.1, discharge to CIR with DAPT and statin. Recommended 30-day cardiac event monitoring but not done.  Hypertension Home meds: Lisinopril 10 mg daily Resume home lisinopril Blood Pressure Goal: BP less than 180/105  Long-term BP goal normotensive  Hyperlipidemia Home meds: Crestor 20 LDL 101, goal < 70 New rosuvastatin 20 mg daily, add Zetia 10 Continue statin and Setia at discharge  Other Stroke Risk Factors Advanced age   Other Active Problems None  Hospital day # 1  Patient seen by NP and then by MD, MD to edit note as needed. Cortney E Ernestina Columbia , MSN, AGACNP-BC [facility] See Amion for schedule and pager information 08/02/2023 2:02 PM   ATTENDING NOTE: I reviewed above note and agree with the assessment and plan. Pt was seen and examined.   Daughter at bedside.  Patient awake, alert, eyes open, hard of hearing, orientated to age, place, time and people. No aphasia, fluent language, mild dysarthria, following all simple commands. Able to name and repeat and read. No gaze palsy, tracking bilaterally, visual field full, PERRL. Mild right facial droop. Tongue midline. Bilateral UEs 5/5, no drift. Bilaterally LEs 5/5, no drift. Sensation symmetrical bilaterally, b/l FTN intact except slight R dysmetria, gait not tested.   Patient had history of stroke 04/2023, recommended 30-day CardioNet monitor as outpatient but not done.  This time stroke again concerning for cardioembolic source, will consider loop recorder prior to discharge.  MRI done no  hemorrhage, will start DAPT for 3 weeks and then Plavix alone.  Resume statin, add Zetia 10.  PT and OT recommend outpatient PT.  For detailed assessment and plan, please refer to above/below as I have made changes wherever appropriate.   Marvel Plan, MD PhD Stroke Neurology 08/02/2023 6:12 PM  This patient is critically ill due to left MCA stroke status post TNK, history of stroke and at significant risk of neurological worsening, death form recurrent stroke, hemorrhagic transformation, bleeding from TNK. This patient's care requires constant monitoring of vital signs, hemodynamics, respiratory and cardiac monitoring, review of multiple databases, neurological assessment, discussion with family, other specialists and medical decision making of high complexity. I spent 35 minutes of neurocritical care time in the care of this patient. I had long discussion with patient and daughter at bedside, updated pt current condition, treatment plan and potential prognosis, and answered all the questions.  They expressed understanding and appreciation.      To contact Stroke Continuity provider, please refer to [email or web address]. After hours, contact General Neurology

## 2023-08-02 NOTE — Progress Notes (Signed)
PT Cancellation Note  Patient Details Name: Ethan Payne MRN: [id number] DOB: September 15, 1946   Cancelled Treatment:    Reason Eval/Treat Not Completed: (P) Active bedrest order. Pt administered tNKASE at 13:43 yesterday (08/02/23). Per PT protocol, will plan to hold off until > 24 hours after receiving tNKASE unless cleared by MD to do otherwise. Will plan to follow-up later today.   [name], PT, DPT Acute Rehabilitation Services  Office: [phone]    [name] 08/02/2023, 7:49 AM

## 2023-08-02 NOTE — Evaluation (Signed)
Occupational Therapy Evaluation Patient Details Name: Ethan Payne MRN: [id number] DOB: 01/24/1946 Today's Date: 08/02/2023   History of Present Illness Ethan Payne is a 77 y.o. male admitted 08/01/23 with transient episodes of headache, confusion, task-specific apraxia and aphasia as well as R sided neglect. Administered tNKASE. MRI showed multiple small acute infarcts in the left frontal and parietal lobes. PMH significant for L MCA stroke s/p TNK in May 2024, HTN, HLD   Clinical Impression   Pt is typically independent in ADL and mobility with slight residual R sided weakness from previous CVA in May. Pt is HOH and did not have hearing aides today, but he was able to follow commands and answer questions appropriately. During functional tasks, Pt states he is right handed, but deferred to L hand to complete the task - continuing to demonstrate R inattention? Or learned habits from May? Overall he is supervision for UB and LB ADL. OT will continue to follow acutely with continued focus on potential R inattention?, energy conservation, and higher level cognition. No Follow up OT indicated at this time. Next session to focus on Pill box test or cognitive assessment.      If plan is discharge home, recommend the following: A little help with bathing/dressing/bathroom;Direct supervision/assist for medications management;Direct supervision/assist for financial management    Functional Status Assessment  Patient has had a recent decline in their functional status and demonstrates the ability to make significant improvements in function in a reasonable and predictable amount of time.  Equipment Recommendations  None recommended by OT    Recommendations for Other Services PT consult     Precautions / Restrictions Precautions Precautions: Fall Restrictions Weight Bearing Restrictions: No      Mobility Bed Mobility               General bed mobility comments: OOB at beginning  and end of session in recliner    Transfers Overall transfer level: Needs assistance Equipment used: None Transfers: Sit to/from Stand Sit to Stand: Contact guard assist           General transfer comment: Slightly slow to power up to stand, CGA for safety      Balance Overall balance assessment: Mild deficits observed, not formally tested                                         ADL either performed or assessed with clinical judgement   ADL Overall ADL's : Needs assistance/impaired Eating/Feeding: Independent   Grooming: Supervision/safety;Wash/dry hands;Standing Grooming Details (indicate cue type and reason): sink level Upper Body Bathing: Supervision/ safety;Standing   Lower Body Bathing: Supervison/ safety;Sitting/lateral leans   Upper Body Dressing : Set up;Standing Upper Body Dressing Details (indicate cue type and reason): gown like robe Lower Body Dressing: Supervision/safety;Sitting/lateral leans Lower Body Dressing Details (indicate cue type and reason): donning socks/doffing socks Toilet Transfer: Contact guard assist;Ambulation   Toileting- Clothing Manipulation and Hygiene: Supervision/safety;Sit to/from stand       Functional mobility during ADLs: Contact guard assist (no DME) General ADL Comments: Pt overall supervision, but did some things like forgot to pull up his underwear (that was around his knees) before he started walking away from toilet     Vision Ability to See in Adequate Light: 0 Adequate Patient Visual Report: No change from baseline Vision Assessment?: Yes Eye Alignment: Within Functional Limits Ocular Range of Motion:  Within Functional Limits Alignment/Gaze Preference: Within Defined Limits Tracking/Visual Pursuits: Able to track stimulus in all quads without difficulty Saccades: Additional eye shifts occurred during testing Convergence: Within functional limits     Perception         Praxis          Pertinent Vitals/Pain Pain Assessment Pain Assessment: No/denies pain Faces Pain Scale: No hurt Pain Intervention(s): Monitored during session     Extremity/Trunk Assessment Upper Extremity Assessment Upper Extremity Assessment: RUE deficits/detail;Right hand dominant RUE Deficits / Details: generalized weakness that Pt reports is from previous CVA in May, functional RUE Sensation: decreased proprioception (very slight R inattention) RUE Coordination: WNL   Lower Extremity Assessment Lower Extremity Assessment: RLE deficits/detail;LLE deficits/detail RLE Deficits / Details: mild weakness on R noted more functionally than with formal testing, reportedly residual R weakness from prior CVA; MMT scores of 5 grossly; sensation and coordination intact LLE Deficits / Details: MMT scores of 5 grossly; incoordination on L noted with rapid alternating movement of tapping bil feet simultaneously LLE Coordination: decreased gross motor   Cervical / Trunk Assessment Cervical / Trunk Assessment: Normal   Communication Communication Communication: Difficulty communicating thoughts/reduced clarity of speech   Cognition Arousal: Alert Behavior During Therapy: WFL for tasks assessed/performed Overall Cognitive Status: Difficult to assess                                 General Comments: HOH impacting his processing and response speed (hearing aides not present today), unclear how much is related to deficits in communication as well; follows simple cues appropriately though; may benefit from further assessment     General Comments  VSS On RA    Exercises     Shoulder Instructions      Home Living Family/patient expects to be discharged to:: Private residence Living Arrangements: Spouse/significant other Available Help at Discharge: Family;Available 24 hours/day Type of Home: House Home Access: Stairs to enter [employer] of Steps: 4 Entrance Stairs-Rails:  None Home Layout: Two level;Able to live on main level with bedroom/bathroom Alternate Level Stairs-Number of Steps: 15   Bathroom Shower/Tub: Chief Strategy Officer: Standard     Home Equipment: None          Prior Functioning/Environment Prior Level of Function : Independent/Modified Independent;Driving             Mobility Comments: No AD, no recent falls ADLs Comments: 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; likes to fish        OT Problem List: Decreased strength;Impaired balance (sitting and/or standing);Decreased coordination;Decreased cognition      OT Treatment/Interventions: Self-care/ADL training;Neuromuscular education;Therapeutic activities;Patient/family education;Balance training    OT Goals(Current goals can be found in the care plan section) Acute Rehab OT Goals Patient Stated Goal: be as independent as possible OT Goal Formulation: With patient Time For Goal Achievement: 08/16/23 Potential to Achieve Goals: Good ADL Goals Pt Will Perform Grooming: with modified independence;standing Pt Will Perform Upper Body Dressing: with modified independence;standing Pt Will Perform Lower Body Dressing: with modified independence;sit to/from stand Pt Will Transfer to Toilet: with modified independence;ambulating Pt Will Perform Toileting - Clothing Manipulation and hygiene: with modified independence;sit to/from stand Pt/caregiver will Perform Home Exercise Program: Right Upper extremity;With written HEP provided;Independently Additional ADL Goal #1: Pt will utilize R hand for functional tasks as dominant hand 75% of the time  OT Frequency: Min  1X/week    Co-evaluation              AM-PAC OT "6 Clicks" Daily Activity     Outcome Measure Help from another person eating meals?: None Help from another person taking care of personal grooming?: A Little Help from another person toileting, which includes using toliet,  bedpan, or urinal?: A Little Help from another person bathing (including washing, rinsing, drying)?: A Little Help from another person to put on and taking off regular upper body clothing?: None Help from another person to put on and taking off regular lower body clothing?: A Little 6 Click Score: 20   End of Session Equipment Utilized During Treatment: Gait belt Nurse Communication: Mobility status;Precautions  Activity Tolerance: Patient tolerated treatment well Patient left: in chair;with call bell/phone within reach;with chair alarm set  OT Visit Diagnosis: Other abnormalities of gait and mobility (R26.89);Muscle weakness (generalized) (M62.81);Other symptoms and signs involving the nervous system (R29.898);Other symptoms and signs involving cognitive function                Time: 1540-1605 OT Time Calculation (min): 25 min Charges:  OT General Charges $OT Visit: 1 Visit OT Evaluation $OT Eval Moderate Complexity: 1 Mod OT Treatments $Self Care/Home Management : 8-22 mins  Nyoka Cowden OTR/L Acute Rehabilitation Services Office: [phone]  Evern Bio [facility] 08/02/2023, 5:43 PM

## 2023-08-02 NOTE — Evaluation (Signed)
Physical Therapy Evaluation Patient Details Name: Ethan Payne MRN: [id number] DOB: 1946/11/15 Today's Date: 08/02/2023  History of Present Illness  Ethan Payne is a 77 y.o. male admitted 08/01/23 with transient episodes of headache, confusion, task-specific apraxia and aphasia as well as R sided neglect. Administered tNKASE. MRI showed multiple small acute infarcts in the left frontal and parietal lobes. PMH significant for L MCA stroke s/p TNK in May 2024, HTN, HLD   Clinical Impression  Pt presents with condition above and deficits mentioned below, see PT Problem List. PTA, he was independent without DME, driving, and living with his wife in a 2-level house (does not need to go upstairs) with 4 STE. Pt reports some mild R residual weakness from a prior CVA earlier this year, which was evident more with functional mobility than with formal testing. He demonstrates some incoordination in his L leg with rapid alternating movement though. At this time, pt demonstrates some mild balance deficits but is able to perform all functional mobility, including navigating x3 stairs, without UE support at a CGA-SUP level. His DGI score of 18/24 this date does suggest he is at risk for falls, but he appears to alter his gait speed as needed to ensure his stability and safety with challenges. Recommending follow-up with neuro OPPT upon d/c home. Will continue to follow acutely.      If plan is discharge home, recommend the following: Assistance with cooking/housework;Assist for transportation;Help with stairs or ramp for entrance   Can travel by private vehicle        Equipment Recommendations None recommended by PT  Recommendations for Other Services       Functional Status Assessment Patient has had a recent decline in their functional status and demonstrates the ability to make significant improvements in function in a reasonable and predictable amount of time.     Precautions / Restrictions  Precautions Precautions: Fall Restrictions Weight Bearing Restrictions: No      Mobility  Bed Mobility Overal bed mobility: Needs Assistance Bed Mobility: Supine to Sit     Supine to sit: Supervision, HOB elevated     General bed mobility comments: Supervision for safety, HOB elevated    Transfers Overall transfer level: Needs assistance Equipment used: None Transfers: Sit to/from Stand Sit to Stand: Contact guard assist           General transfer comment: Slightly slow to power up to stand, CGA for safety    Ambulation/Gait Ambulation/Gait assistance: Contact guard assist Gait Distance (Feet): 200 Feet Assistive device: None Gait Pattern/deviations: Step-through pattern, Decreased weight shift to right, Decreased stance time - right, Decreased stride length Gait velocity: reduced Gait velocity interpretation: 1.31 - 2.62 ft/sec, indicative of limited community ambulator   General Gait Details: Pt with slightly decreased R stance time, likely due to reported and noted residual R leg functional weakness from prior CVA. No overt LOB, even with DGI challenges, CGA for safety. Slows speed at times for DGI challenges though.  Stairs Stairs: Yes Stairs assistance: Contact guard assist Stair Management: No rails, Alternating pattern, Forwards Number of Stairs: 3 General stair comments: Ascends and descends without LOB, mildly slowly though, CGA for safety. Questionable safe placement of R foot ascending at times as it appeared to maybe need to be further on the step before ascending.  Wheelchair Mobility     Tilt Bed    Modified Rankin (Stroke Patients Only) Modified Rankin (Stroke Patients Only) Pre-Morbid Rankin Score: No significant disability Modified Rankin:  Slight disability     Balance Overall balance assessment: Mild deficits observed, not formally tested                               Standardized Balance Assessment Standardized Balance  Assessment : Dynamic Gait Index   Dynamic Gait Index Level Surface: Mild Impairment Change in Gait Speed: Mild Impairment Gait with Horizontal Head Turns: Mild Impairment Gait with Vertical Head Turns: Mild Impairment Gait and Pivot Turn: Normal Step Over Obstacle: Mild Impairment Step Around Obstacles: Mild Impairment Steps: Normal Total Score: 18       Pertinent Vitals/Pain Pain Assessment Pain Assessment: Faces Faces Pain Scale: No hurt Pain Intervention(s): Monitored during session    Home Living Family/patient expects to be discharged to:: Private residence Living Arrangements: Spouse/significant other Available Help at Discharge: Family;Available 24 hours/day Type of Home: House Home Access: Stairs to enter Entrance Stairs-Rails: None Entrance Stairs-Number of Steps: 4 Alternate Level Stairs-Number of Steps: 15 Home Layout: Two level;Able to live on main level with bedroom/bathroom Home Equipment: None      Prior Function Prior Level of Function : Independent/Modified Independent;Driving             Mobility Comments: No AD, no recent falls ADLs Comments: 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; likes to fish     Extremity/Trunk Assessment   Upper Extremity Assessment Upper Extremity Assessment: Defer to OT evaluation    Lower Extremity Assessment Lower Extremity Assessment: RLE deficits/detail;LLE deficits/detail RLE Deficits / Details: mild weakness on R noted more functionally than with formal testing, reportedly residual R weakness from prior CVA; MMT scores of 5 grossly; sensation and coordination intact LLE Deficits / Details: MMT scores of 5 grossly; incoordination on L noted with rapid alternating movement of tapping bil feet simultaneously LLE Coordination: decreased gross motor    Cervical / Trunk Assessment Cervical / Trunk Assessment: Normal  Communication   Communication Communication: Difficulty communicating  thoughts/reduced clarity of speech  Cognition Arousal: Alert Behavior During Therapy: WFL for tasks assessed/performed Overall Cognitive Status: Difficult to assess                                 General Comments: HOH impacting his processing and response speed (hearing aides not present today), unclear how much is related to deficits in communication as well; follows simple cues appropriately though; may benefit from further assessment        General Comments General comments (skin integrity, edema, etc.): VSS on RA    Exercises     Assessment/Plan    PT Assessment Patient needs continued PT services  PT Problem List Decreased strength;Decreased activity tolerance;Decreased balance;Decreased mobility;Decreased coordination       PT Treatment Interventions DME instruction;Gait training;Stair training;Functional mobility training;Therapeutic activities;Therapeutic exercise;Balance training;Neuromuscular re-education;Cognitive remediation;Patient/family education    PT Goals (Current goals can be found in the Care Plan section)  Acute Rehab PT Goals Patient Stated Goal: to improve PT Goal Formulation: With patient Time For Goal Achievement: 08/16/23 Potential to Achieve Goals: Good    Frequency Min 1X/week     Co-evaluation               AM-PAC PT "6 Clicks" Mobility  Outcome Measure Help needed turning from your back to your side while in a flat bed without using bedrails?: None Help needed moving from  lying on your back to sitting on the side of a flat bed without using bedrails?: A Little Help needed moving to and from a bed to a chair (including a wheelchair)?: A Little Help needed standing up from a chair using your arms (e.g., wheelchair or bedside chair)?: A Little Help needed to walk in hospital room?: A Little Help needed climbing 3-5 steps with a railing? : A Little 6 Click Score: 19    End of Session Equipment Utilized During Treatment:  Gait belt Activity Tolerance: Patient tolerated treatment well Patient left: Other (comment) (standing with OT) Nurse Communication: Mobility status PT Visit Diagnosis: Unsteadiness on feet (R26.81);Other abnormalities of gait and mobility (R26.89);Muscle weakness (generalized) (M62.81);Other symptoms and signs involving the nervous system (R29.898)    Time: 1610-9604 PT Time Calculation (min) (ACUTE ONLY): 15 min   Charges:   PT Evaluation $PT Eval Moderate Complexity: 1 Mod   PT General Charges $$ ACUTE PT VISIT: 1 Visit         [name], PT, DPT Acute Rehabilitation Services  Office: [phone]   [name] 08/02/2023, 3:56 PM

## 2023-08-02 NOTE — Progress Notes (Signed)
OT Cancellation Note  Patient Details Name: Ethan Payne MRN: [id number] DOB: [birth year]   Cancelled Treatment:    Reason Eval/Treat Not Completed: Active bedrest order;Patient not medically ready.  Pt administered tNKASE at 13:43 yesterday (08/02/23). Per Therapy protocol, will plan to hold off until > 24 hours after receiving tNKASE unless cleared by MD to do otherwise. Will plan to follow-up later today.   [name] 08/02/2023, 9:09 AM  Nyoka Cowden OTR/L Acute Rehabilitation Services Office: [phone]

## 2023-08-02 NOTE — Progress Notes (Signed)
*  PRELIMINARY RESULTS* Echocardiogram 2D Echocardiogram has been performed.  Stacey Drain 08/02/2023, 11:10 AM

## 2023-08-03 DIAGNOSIS — I639 Cerebral infarction, unspecified: Secondary | ICD-10-CM | POA: Diagnosis not present

## 2023-08-03 LAB — BASIC METABOLIC PANEL
Anion gap: 7 (ref 5–15)
BUN: 13 mg/dL (ref 8–23)
CO2: 22 mmol/L (ref 22–32)
Calcium: 8.8 mg/dL — ABNORMAL LOW (ref 8.9–10.3)
Chloride: 106 mmol/L (ref 98–111)
Creatinine, Ser: 1.26 mg/dL — ABNORMAL HIGH (ref 0.61–1.24)
GFR, Estimated: 60 mL/min — ABNORMAL LOW (ref >60)
Glucose, Bld: 114 mg/dL — ABNORMAL HIGH (ref 70–99)
Potassium: 3.6 mmol/L (ref 3.5–5.1)
Sodium: 135 mmol/L (ref 135–145)

## 2023-08-03 LAB — CBC
HCT: 42.6 % (ref 39.0–52.0)
Hemoglobin: 14.8 g/dL (ref 13.0–17.0)
MCH: 30.7 pg (ref 26.0–34.0)
MCHC: 34.7 g/dL (ref 30.0–36.0)
MCV: 88.4 fL (ref 80.0–100.0)
Platelets: 209 10*3/uL (ref 150–400)
RBC: 4.82 MIL/uL (ref 4.22–5.81)
RDW: 12.4 % (ref 11.5–15.5)
WBC: 8.7 10*3/uL (ref 4.0–10.5)
nRBC: 0 % (ref 0.0–0.2)

## 2023-08-03 NOTE — Plan of Care (Addendum)
  Problem: Education: Goal: Knowledge of disease or condition will improve Outcome: Progressing Goal: Knowledge of secondary prevention will improve (MUST DOCUMENT ALL) Outcome: Progressing Goal: Knowledge of patient specific risk factors will improve Loraine Leriche N/A or DELETE if not current risk factor) Outcome: Progressing   Problem: Ischemic Stroke/TIA Tissue Perfusion: Goal: Complications of ischemic stroke/TIA will be minimized Outcome: Progressing   Problem: Coping: Goal: Will verbalize positive feelings about self Outcome: Progressing Goal: Will identify appropriate support needs Outcome: Progressing   Problem: Health Behavior/Discharge Planning: Goal: Ability to manage health-related needs will improve Outcome: Progressing Goal: Goals will be collaboratively established with patient/family Outcome: Progressing   Problem: Self-Care: Goal: Ability to participate in self-care as condition permits will improve Outcome: Progressing Goal: Verbalization of feelings and concerns over difficulty with self-care will improve Outcome: Progressing Goal: Ability to communicate needs accurately will improve Outcome: Progressing   Problem: Clinical Measurements: Goal: Ability to maintain clinical measurements within normal limits will improve Outcome: Progressing Goal: Will remain free from infection Outcome: Progressing Goal: Diagnostic test results will improve Outcome: Progressing Goal: Respiratory complications will improve Outcome: Progressing Goal: Cardiovascular complication will be avoided Outcome: Progressing

## 2023-08-03 NOTE — TOC Initial Note (Signed)
Transition of Care [facility]) - Initial/Assessment Note    Patient Details  Name: Ethan Payne MRN: [id number] Date of Birth: 1946-08-19  [facility]) CM/SW Contact:    Lawerance Sabal, RN Phone Number: 08/03/2023, 3:19 PM  Clinical Narrative:                  [name] w patient at bedside and discussed benefits of OP PT and locations. He declines, stating he doesn't really feel like he needs it.  He declines DME as well.  No other TOC needs identified at this time.   Expected Discharge Plan: Home/Self Care Barriers to Discharge: Continued Medical Work up   Patient Goals and CMS Choice Patient states their goals for this hospitalization and ongoing recovery are:: to go home CMS Medicare.gov Compare Post Acute Care list provided to:: Patient        Expected Discharge Plan and Services   Discharge Planning Services: CM Consult Post Acute Care Choice: NA Living arrangements for the past 2 months: Single Family Home                 DME Arranged: N/A         HH Arranged: NA          Prior Living Arrangements/Services Living arrangements for the past 2 months: Single Family Home Lives with:: Spouse                   Activities of Daily Living      Permission Sought/Granted                  Emotional Assessment              Admission diagnosis:  Aphasia [R47.01] Acute ischemic stroke [facility]) [I63.9] Patient Active Problem List   Diagnosis Date Noted   Acute embolic stroke (HCC) 04/24/2023   Acute ischemic stroke (HCC) 04/21/2023   Paroxysmal atrial fibrillation (HCC) 12/21/2021   Leg length difference, acquired 01/07/2018   Rotator cuff syndrome of right shoulder 12/08/2017   BPH associated with nocturia 04/10/2015   Health care maintenance 02/18/2015   Erectile dysfunction 02/18/2015   CAD (coronary artery disease) 02/17/2015   Heel pain 03/22/2011   GERD (gastroesophageal reflux disease) 03/22/2011   Osteoarthritis of right knee  04/18/2010   s/p excision of Atrial myxoma 02/04/2008   Hyperlipidemia 02/04/2008   Essential hypertension 02/04/2008   PCP:  Shelva Majestic, MD Pharmacy:   [facility] [phone] Gin[place] Otto, [place] - [address] AT [facility] [address] [place] [address] Phone: [phone] Fax: [phone]  [facility] [address] [place] [address] Phone: [phone] Fax: [phone]  [id number] - [place], [place] - [address] [place] [address] Phone: [phone] Fax: [phone]     Social Determinants of Health (SDOH) Social History: SDOH Screenings   Food Insecurity: No Food Insecurity (04/23/2023)  Housing: Low Risk  (04/23/2023)  Transportation Needs: No Transportation Needs (04/23/2023)  Utilities: Not At Risk (04/23/2023)  Depression (PHQ2-9): Low Risk  (06/16/2023)  Financial Resource Strain: Low Risk  (03/24/2023)  Physical Activity: Insufficiently Active (03/24/2023)  Social Connections: Moderately Isolated (03/24/2023)  Stress: No Stress Concern Present (03/24/2023)  Tobacco Use: Low Risk  (07/04/2023)   SDOH Interventions:     Readmission Risk Interventions     No data to display

## 2023-08-03 NOTE — Plan of Care (Signed)

## 2023-08-03 NOTE — Discharge Summary (Shared)
Stroke Discharge Summary  Patient ID: Ethan Payne   MRN: [id number]      DOB: 1946/06/19  Date of Admission: 08/01/2023 Date of Discharge: 08/05/2023  Attending Physician:  Stroke, Md, MD Consultant(s):    None  Patient's PCP:  Shelva Majestic, MD  DISCHARGE PRIMARY DIAGNOSIS:   Acute Ischemic Infarct:  left MCA small infarcts s/p TNK, etiology: Likely cardioembolic, concerning occult afib   Secondary Diagnoses: Hypertension Hyperlipidemia Hx of stroke  Allergies as of 08/05/2023       Reactions   Hydrocodone Palpitations   After rib fractures. Hydrocodone-States had shortness of breath, 911 was called   Loratadine Anxiety, Other (See Comments)   Heart speeds up        Medication List     STOP taking these medications    ibuprofen 200 MG tablet Commonly known as: ADVIL       TAKE these medications    amLODipine 5 MG tablet Commonly known as: NORVASC Take 1 tablet (5 mg total) by mouth daily.   aspirin EC 81 MG tablet Take 1 tablet (81 mg total) by mouth daily. Swallow whole. Start taking on: August 06, 2023   calcium carbonate 500 MG chewable tablet Commonly known as: TUMS - dosed in mg elemental calcium Chew 1,000 mg by mouth in the morning and at bedtime.   clopidogrel 75 MG tablet Commonly known as: PLAVIX Take 1 tablet (75 mg total) by mouth daily. Start taking on: August 06, 2023   ezetimibe 10 MG tablet Commonly known as: ZETIA Take 1 tablet (10 mg total) by mouth daily. Start taking on: August 06, 2023   lisinopril 20 MG tablet Commonly known as: ZESTRIL Take 1 tablet (20 mg total) by mouth daily. What changed: how much to take   rosuvastatin 20 MG tablet Commonly known as: CRESTOR Take 1 tablet (20 mg total) by mouth daily.        LABORATORY STUDIES CBC    Component Value Date/Time   WBC 8.6 08/05/2023 0806   RBC 4.86 08/05/2023 0806   HGB 15.4 08/05/2023 0806   HCT 44.7 08/05/2023 0806   PLT 211 08/05/2023 0806    MCV 92.0 08/05/2023 0806   MCH 31.7 08/05/2023 0806   MCHC 34.5 08/05/2023 0806   RDW 12.5 08/05/2023 0806   LYMPHSABS 1.7 08/01/2023 1310   MONOABS 0.8 08/01/2023 1310   EOSABS 0.7 (H) 08/01/2023 1310   BASOSABS 0.0 08/01/2023 1310   CMP    Component Value Date/Time   NA 137 08/05/2023 0806   K 4.0 08/05/2023 0806   CL 102 08/05/2023 0806   CO2 25 08/05/2023 0806   GLUCOSE 118 (H) 08/05/2023 0806   BUN 13 08/05/2023 0806   CREATININE 1.20 08/05/2023 0806   CALCIUM 9.9 08/05/2023 0806   PROT 8.0 08/01/2023 1310   ALBUMIN 4.4 08/01/2023 1310   AST 25 08/01/2023 1310   ALT 29 08/01/2023 1310   ALKPHOS 39 08/01/2023 1310   BILITOT 1.0 08/01/2023 1310   GFRNONAA >60 08/05/2023 0806   GFRAA  10/19/2009 0315    >60        The eGFR has been calculated using the MDRD equation. This calculation has not been validated in all clinical situations. eGFR's persistently <60 mL/min signify possible Chronic Kidney Disease.   COAGS Lab Results  Component Value Date   INR 1.0 08/01/2023   INR 1.1 04/21/2023   Lipid Panel    Component Value Date/Time  CHOL 162 08/02/2023 0408   TRIG 94 08/02/2023 0408   HDL 42 08/02/2023 0408   CHOLHDL 3.9 08/02/2023 0408   VLDL 19 08/02/2023 0408   LDLCALC 101 (H) 08/02/2023 0408   HgbA1C  Lab Results  Component Value Date   HGBA1C 5.9 (H) 08/02/2023    Alcohol Level    Component Value Date/Time   ETH <10 08/01/2023 1310     SIGNIFICANT DIAGNOSTIC STUDIES VAS [place] LOWER EXTREMITY VENOUS (DVT)  Result Date: 08/05/2023  Lower Venous DVT Study Patient Name:  Ethan Payne  Date of Exam:   08/05/2023 Medical Rec #: [id number]        Accession #:    [id number] Date of Birth: Mar 05, 1946       Patient Gender: M Patient Age:   77 years Exam Location:  [facility] Procedure:      VAS [place] LOWER EXTREMITY VENOUS (DVT) Referring Phys: Scheryl Marten XU --------------------------------------------------------------------------------   Indications: Stroke.  Risk Factors: None identified. Comparison Study: No prior study Performing Technologist: Shona Simpson  Examination Guidelines: A complete evaluation includes B-mode imaging, spectral Doppler, color Doppler, and power Doppler as needed of all accessible portions of each vessel. Bilateral testing is considered an integral part of a complete examination. Limited examinations for reoccurring indications may be performed as noted. The reflux portion of the exam is performed with the patient in reverse Trendelenburg.  +---------+---------------+---------+-----------+----------+--------------+ RIGHT    CompressibilityPhasicitySpontaneityPropertiesThrombus Aging +---------+---------------+---------+-----------+----------+--------------+ CFV      Full           Yes      Yes                                 +---------+---------------+---------+-----------+----------+--------------+ SFJ      Full                                                        +---------+---------------+---------+-----------+----------+--------------+ FV Prox  Full                                                        +---------+---------------+---------+-----------+----------+--------------+ FV Mid   Full                                                        +---------+---------------+---------+-----------+----------+--------------+ FV DistalFull                                                        +---------+---------------+---------+-----------+----------+--------------+ PFV      Full                                                        +---------+---------------+---------+-----------+----------+--------------+  POP      Full           Yes      Yes                                 +---------+---------------+---------+-----------+----------+--------------+ PTV      Full                                                         +---------+---------------+---------+-----------+----------+--------------+ PERO     Full                                                        +---------+---------------+---------+-----------+----------+--------------+   +---------+---------------+---------+-----------+----------+--------------+ LEFT     CompressibilityPhasicitySpontaneityPropertiesThrombus Aging +---------+---------------+---------+-----------+----------+--------------+ CFV      Full           Yes      Yes                                 +---------+---------------+---------+-----------+----------+--------------+ SFJ      Full                                                        +---------+---------------+---------+-----------+----------+--------------+ FV Prox  Full                                                        +---------+---------------+---------+-----------+----------+--------------+ FV Mid   Full                                                        +---------+---------------+---------+-----------+----------+--------------+ FV DistalFull                                                        +---------+---------------+---------+-----------+----------+--------------+ PFV      Full                                                        +---------+---------------+---------+-----------+----------+--------------+ POP      Full           Yes      Yes                                 +---------+---------------+---------+-----------+----------+--------------+  PTV      Full                                                        +---------+---------------+---------+-----------+----------+--------------+ PERO     Full                                                        +---------+---------------+---------+-----------+----------+--------------+     Summary: BILATERAL: - No evidence of deep vein thrombosis seen in the lower extremities, bilaterally. - RIGHT: - A  cystic structure is found in the popliteal fossa.  LEFT: - No cystic structure found in the popliteal fossa.  *See table(s) above for measurements and observations.    Preliminary    MR BRAIN WO CONTRAST  Result Date: 08/02/2023 CLINICAL DATA:  Stroke, follow up EXAM: MRI HEAD WITHOUT CONTRAST TECHNIQUE: Multiplanar, multiecho pulse sequences of the brain and surrounding structures were obtained without intravenous contrast. COMPARISON:  CT head 08/01/2023. FINDINGS: Brain: Multiple small acute infarcts in the left frontal and parietal lobes. Mild edema without significant mass effect. No evidence of acute hemorrhage, mass lesion, midline shift or hydrocephalus. Remote bilateral cerebellar infarcts. Vascular: Major arterial flow voids are maintained. Skull and upper cervical spine: Normal marrow signal. Sinuses/Orbits: Clear sinuses.  No acute orbital findings. Other: No mastoid effusions. IMPRESSION: Multiple small acute infarcts in the left frontal and parietal lobes. Electronically Signed   By: Feliberto Harts M.D.   On: 08/02/2023 12:03   ECHOCARDIOGRAM COMPLETE  Result Date: 08/02/2023    ECHOCARDIOGRAM REPORT   Patient Name:   RADFORD RIVERA Date of Exam: 08/02/2023 Medical Rec #:  [id number]       Height:       66.0 in Accession #:    [id number]      Weight:       166.9 lb Date of Birth:  08-08-1946      BSA:          1.852 m Patient Age:    76 years        BP:           147/80 mmHg Patient Gender: M               HR:           83 bpm. Exam Location:  Inpatient Procedure: 2D Echo, Cardiac Doppler and Color Doppler Indications:    Stroke I63.9  History:        Patient has prior history of Echocardiogram examinations, most                 recent 04/22/2023. CAD; Risk Factors:Hypertension and                 Dyslipidemia. ATRIAL MYXOMA (From Hx).  Sonographer:    Celesta Gentile RCS Referring Phys: [id number] Lynnae January IMPRESSIONS  1. Left ventricular ejection fraction, by estimation, is 60 to 65%. The  left ventricle has normal function. The left ventricle has no regional wall motion abnormalities. There is mild left ventricular hypertrophy. Indeterminate diastolic filling due to E-A fusion.  2. Right ventricular systolic function is normal. The right ventricular  size is normal. Tricuspid regurgitation signal is inadequate for assessing PA pressure.  3. Left atrial size was mild to moderately dilated.  4. No evidence of mitral valve regurgitation.  5. Aortic valve regurgitation is trivial. Aortic valve sclerosis/calcification is present, without any evidence of aortic stenosis.  6. The inferior vena cava is normal in size with greater than 50% respiratory variability, suggesting right atrial pressure of 3 mmHg. Comparison(s): No significant change from prior study. FINDINGS  Left Ventricle: Left ventricular ejection fraction, by estimation, is 60 to 65%. The left ventricle has normal function. The left ventricle has no regional wall motion abnormalities. The left ventricular internal cavity size was normal in size. There is  mild left ventricular hypertrophy. Indeterminate diastolic filling due to E-A fusion. Right Ventricle: The right ventricular size is normal. Right ventricular systolic function is normal. Tricuspid regurgitation signal is inadequate for assessing PA pressure. Left Atrium: Left atrial size was mild to moderately dilated. Right Atrium: Right atrial size was normal in size. Pericardium: There is no evidence of pericardial effusion. Mitral Valve: No evidence of mitral valve regurgitation. Tricuspid Valve: Tricuspid valve regurgitation is not demonstrated. Aortic Valve: Aortic valve regurgitation is trivial. Aortic valve sclerosis/calcification is present, without any evidence of aortic stenosis. Pulmonic Valve: Pulmonic valve regurgitation is not visualized. Aorta: The aortic root and ascending aorta are structurally normal, with no evidence of dilitation. Venous: The inferior vena cava is normal  in size with greater than 50% respiratory variability, suggesting right atrial pressure of 3 mmHg. IAS/Shunts: No atrial level shunt detected by color flow Doppler.  LEFT VENTRICLE PLAX 2D LVIDd:         4.30 cm   Diastology LVIDs:         2.90 cm   LV e' medial:    8.49 cm/s LV PW:         1.20 cm   LV E/e' medial:  13.9 LV IVS:        1.30 cm   LV e' lateral:   9.25 cm/s LVOT diam:     2.10 cm   LV E/e' lateral: 12.8 LV SV:         76 LV SV Index:   41 LVOT Area:     3.46 cm  RIGHT VENTRICLE RV S prime:     12.60 cm/s TAPSE (M-mode): 1.8 cm LEFT ATRIUM             Index        RIGHT ATRIUM           Index LA diam:        4.30 cm 2.32 cm/m   RA Area:     17.20 cm LA Vol (A2C):   44.9 ml 24.25 ml/m  RA Volume:   46.40 ml  25.06 ml/m LA Vol (A4C):   75.8 ml 40.93 ml/m LA Biplane Vol: 59.5 ml 32.13 ml/m  AORTIC VALVE LVOT Vmax:   93.30 cm/s LVOT Vmean:  61.700 cm/s LVOT VTI:    0.220 m  AORTA Ao Root diam: 3.50 cm MITRAL VALVE MV Area (PHT): 3.60 cm     SHUNTS MV Decel Time: 211 msec     Systemic VTI:  0.22 m MV E velocity: 118.00 cm/s  Systemic Diam: 2.10 cm MV A velocity: 57.00 cm/s MV E/A ratio:  2.07 Carolan Clines Electronically signed by Carolan Clines Signature Date/Time: 08/02/2023/11:43:16 AM    Final    CT HEAD WO CONTRAST ( )  Result Date: 08/01/2023 CLINICAL  DATA:  Headache, rash EXAM: CT HEAD WITHOUT CONTRAST TECHNIQUE: Contiguous axial images were obtained from the base of the skull through the vertex without intravenous contrast. RADIATION DOSE REDUCTION: This exam was performed according to the departmental dose-optimization program which includes automated exposure control, adjustment of the mA and/or kV according to patient size and/or use of iterative reconstruction technique. COMPARISON:  08/01/2023 at 1:21 p.m. FINDINGS: Brain: The small area of left frontal subcortical hypodensity seen on prior exam is again identified, best seen on image 51/6. No new areas of infarct or hemorrhage.  Lateral ventricles and midline structures are unremarkable. No acute extra-axial fluid collections. No mass effect. Vascular: Residual intravascular contrast from prior CT angiography. Skull: Normal. Negative for fracture or focal lesion. Sinuses/Orbits: No acute finding. Other: None. IMPRESSION: 1. Stable small focal subcortical hypodensity left frontal lobe, which could reflect an acute infarct. 2. No new areas of infarct or hemorrhage. Electronically Signed   By: Sharlet Salina M.D.   On: 08/01/2023 15:35   CT HEAD CODE STROKE WO CONTRAST  Result Date: 08/01/2023 CLINICAL DATA:  Code stroke.  Aphasia EXAM: CT ANGIOGRAPHY HEAD AND NECK TECHNIQUE: Multidetector CT imaging of the head and neck was performed using the standard protocol during bolus administration of intravenous contrast. Multiplanar CT image reconstructions and MIPs were obtained to evaluate the vascular anatomy. Carotid stenosis measurements (when applicable) are obtained utilizing NASCET criteria, using the distal internal carotid diameter as the denominator. RADIATION DOSE REDUCTION: This exam was performed according to the departmental dose-optimization program which includes automated exposure control, adjustment of the mA and/or kV according to patient size and/or use of iterative reconstruction technique. CONTRAST:  75mL OMNIPAQUE IOHEXOL 350 MG/ML SOLN COMPARISON:  CT head 04/25/2023, CTA head/neck 04/21/2023 FINDINGS: CT HEAD FINDINGS Brain: There is a small region of hypodensity with loss of gray-white differentiation in the left frontal lobe not seen on the prior study suspicious for small acute infarct. There is no other evidence of acute infarct. There is no acute intracranial hemorrhage. Background parenchymal volume is normal. The ventricles are normal in size. Gray-white differentiation is otherwise preserved. Small remote infarcts in both cerebellar hemispheres are again noted. The pituitary and suprasellar region are normal.  There is no mass lesion. There is no mass effect or midline shift. Vascular: See below. Skull: Normal. Negative for fracture or focal lesion. Sinuses/Orbits: The paranasal sinuses are clear. Bilateral lens implants are in place. The globes and orbits are otherwise unremarkable. Other: The mastoid air cells and middle ear cavities are clear. ASPECTS [facility] Stroke Program Early CT Score) - Ganglionic level infarction (caudate, lentiform nuclei, internal capsule, insula, M1-M3 cortex): 7 - Supraganglionic infarction (M4-M6 cortex): 2 Total score (0-10 with 10 being normal): 9 CTA NECK FINDINGS Aortic arch: The imaged aortic arch is normal. The origins of the major branch vessels are patent. The subclavian arteries are patent to the level imaged. Right carotid system: The right common, internal, and external carotid arteries are patent, with mild plaque at the bifurcation but no hemodynamically significant stenosis or occlusion. There is no evidence of dissection or aneurysm. Left carotid system: The left common, internal, and external carotid arteries are patent, with mixed plaque at the bifurcation but no hemodynamically significant stenosis or occlusion. There is no evidence of dissection or aneurysm. Vertebral arteries: The vertebral arteries are patent, without hemodynamically significant stenosis or occlusion. There is no evidence of dissection or aneurysm. Skeleton: There is no acute osseous abnormality or suspicious osseous lesion. Multilevel degenerative  change is again seen in the cervical spine with a least moderate spinal canal stenosis at C5-C6. Other neck: The soft tissues of the neck are unremarkable. Upper chest: The imaged lung apices are clear. Review of the MIP images confirms the above findings CTA HEAD FINDINGS Anterior circulation: The intracranial ICAs are patent. The bilateral MCAs are patent, without proximal stenosis or occlusion. The bilateral ACAS are patent, without proximal stenosis or  occlusion. The anterior communicating artery is normal. There is no aneurysm or AVM. Posterior circulation: The bilateral V4 segments are patent. The basilar artery is patent. The major cerebellar arteries appear patent. The bilateral PCAs are patent, without proximal stenosis or occlusion. There is a fetal origin on the right. There is no aneurysm or AVM. Venous sinuses: Patent. Anatomic variants: As above. Review of the MIP images confirms the above findings IMPRESSION: 1. Suspect small acute infarct in the left frontal lobe. ASPECTS is 9. 2. Patent vasculature of the head and neck with mild plaque at the carotid bifurcations but no hemodynamically significant stenosis, occlusion, or dissection. Findings of the noncontrast head CT communicated to Dr Derry Lory at 1:22 pm. CTA results communicated at 1:30 pm. Electronically Signed   By: Lesia Hausen M.D.   On: 08/01/2023 13:34   CT ANGIO HEAD NECK W WO CM (CODE STROKE)  Result Date: 08/01/2023 CLINICAL DATA:  Code stroke.  Aphasia EXAM: CT ANGIOGRAPHY HEAD AND NECK TECHNIQUE: Multidetector CT imaging of the head and neck was performed using the standard protocol during bolus administration of intravenous contrast. Multiplanar CT image reconstructions and MIPs were obtained to evaluate the vascular anatomy. Carotid stenosis measurements (when applicable) are obtained utilizing NASCET criteria, using the distal internal carotid diameter as the denominator. RADIATION DOSE REDUCTION: This exam was performed according to the departmental dose-optimization program which includes automated exposure control, adjustment of the mA and/or kV according to patient size and/or use of iterative reconstruction technique. CONTRAST:  75mL OMNIPAQUE IOHEXOL 350 MG/ML SOLN COMPARISON:  CT head 04/25/2023, CTA head/neck 04/21/2023 FINDINGS: CT HEAD FINDINGS Brain: There is a small region of hypodensity with loss of gray-white differentiation in the left frontal lobe not seen on the  prior study suspicious for small acute infarct. There is no other evidence of acute infarct. There is no acute intracranial hemorrhage. Background parenchymal volume is normal. The ventricles are normal in size. Gray-white differentiation is otherwise preserved. Small remote infarcts in both cerebellar hemispheres are again noted. The pituitary and suprasellar region are normal. There is no mass lesion. There is no mass effect or midline shift. Vascular: See below. Skull: Normal. Negative for fracture or focal lesion. Sinuses/Orbits: The paranasal sinuses are clear. Bilateral lens implants are in place. The globes and orbits are otherwise unremarkable. Other: The mastoid air cells and middle ear cavities are clear. ASPECTS [facility] Stroke Program Early CT Score) - Ganglionic level infarction (caudate, lentiform nuclei, internal capsule, insula, M1-M3 cortex): 7 - Supraganglionic infarction (M4-M6 cortex): 2 Total score (0-10 with 10 being normal): 9 CTA NECK FINDINGS Aortic arch: The imaged aortic arch is normal. The origins of the major branch vessels are patent. The subclavian arteries are patent to the level imaged. Right carotid system: The right common, internal, and external carotid arteries are patent, with mild plaque at the bifurcation but no hemodynamically significant stenosis or occlusion. There is no evidence of dissection or aneurysm. Left carotid system: The left common, internal, and external carotid arteries are patent, with mixed plaque at the bifurcation but no  hemodynamically significant stenosis or occlusion. There is no evidence of dissection or aneurysm. Vertebral arteries: The vertebral arteries are patent, without hemodynamically significant stenosis or occlusion. There is no evidence of dissection or aneurysm. Skeleton: There is no acute osseous abnormality or suspicious osseous lesion. Multilevel degenerative change is again seen in the cervical spine with a least moderate spinal canal  stenosis at C5-C6. Other neck: The soft tissues of the neck are unremarkable. Upper chest: The imaged lung apices are clear. Review of the MIP images confirms the above findings CTA HEAD FINDINGS Anterior circulation: The intracranial ICAs are patent. The bilateral MCAs are patent, without proximal stenosis or occlusion. The bilateral ACAS are patent, without proximal stenosis or occlusion. The anterior communicating artery is normal. There is no aneurysm or AVM. Posterior circulation: The bilateral V4 segments are patent. The basilar artery is patent. The major cerebellar arteries appear patent. The bilateral PCAs are patent, without proximal stenosis or occlusion. There is a fetal origin on the right. There is no aneurysm or AVM. Venous sinuses: Patent. Anatomic variants: As above. Review of the MIP images confirms the above findings IMPRESSION: 1. Suspect small acute infarct in the left frontal lobe. ASPECTS is 9. 2. Patent vasculature of the head and neck with mild plaque at the carotid bifurcations but no hemodynamically significant stenosis, occlusion, or dissection. Findings of the noncontrast head CT communicated to Dr Derry Lory at 1:22 pm. CTA results communicated at 1:30 pm. Electronically Signed   By: Lesia Hausen M.D.   On: 08/01/2023 13:34       HISTORY OF PRESENT ILLNESS 77 y.o. patient with history of hypertension, hyperlipidemia and left MCA stroke status post TNK in May was admitted with headache, confusion, task specific apraxia and aphasia.  HOSPITAL COURSE Patient was given TNK to treat his stroke, and symptoms did improve shortly thereafter.  MRI was positive for small acute infarcts in left frontal and parietal lobes.  Strokes appear concerning for cardioembolic origin, so loop recorder was implanted prior to discharge.  Acute Ischemic Infarct:  left MCA small infarcts s/p TNK, etiology: Likely cardioembolic, concerning occult afib Code Stroke CT head suspected small acute infarct in  left frontal lobe. ASPECTS 9 CTA head & neck no LVO or hemodynamically significant stenosis MRI multiple small acute infarcts in left frontal and parietal lobes 2D Echo EF 60 to 65%, mild LVH, left atrium mildly to moderately dilated, no atrial level shunt LDL 101 HgbA1c 5.9 Loop Recorder placed 9/3 before discharge VTE prophylaxis - SCDs aspirin 81 mg daily prior to admission, now on ASA and plavix DAPT for 3 months and then plavix alone. Therapy recommendations: Outpatient PT Disposition: Pending   Hx of Stroke/TIA 04/2023 admitted for right-sided weakness.  CT no acute abnormality.  Status post TNK.  CTA head and neck no LVO.  MRI showed left pre and postcentral gyrus infarcts.  EF 55 to 60%.  LDL 219, A1c 6.1, discharge to CIR with DAPT and statin. Recommended 30-day cardiac event monitoring but not done.   Hypertension Home meds: Lisinopril 10 mg daily Resume home lisinopril Blood Pressure Goal: BP less than 180/105  Long-term BP goal normotensive   Hyperlipidemia Home meds: Crestor 20 LDL 101, goal < 70 Now on rosuvastatin 20 mg daily, add Zetia 10. Pt can not tolerating more than crestor 20 Continue statin and Zetia at discharge   Other Stroke Risk Factors Advanced age   Other Active Problems Visual changes-patient has had several episodes over the past week of  seeing small red spots in his vision.  These episodes last about 5 minutes and resolve spontaneously.  Recommend outpatient ophthalmology follow-up.   DISCHARGE EXAM  PHYSICAL EXAM General:  Alert, well-nourished, well-developed patient in no acute distress Psych:  Mood and affect appropriate for situation CV: Regular rate and rhythm on monitor Respiratory:  Regular, unlabored respirations on room air GI: Abdomen soft and nontender  NEURO:  Mental Status: AA&Ox3  Speech/Language: speech is without dysarthria or aphasia.  Naming, repetition, fluency, and comprehension intact.  Cranial Nerves:  II: PERRL.  Visual fields full.  III, IV, VI: EOMI. Eyelids elevate symmetrically.  V: Sensation is intact to light touch and symmetrical to face.  VII: Smile is symmetrical.  VIII: hearing intact to voice. IX, X: Palate elevates symmetrically. Phonation is normal.  GM:[id number] shrug 5/5. XII: tongue is midline without fasciculations. Motor: 5/5 strength to all muscle groups tested.  Tone: is normal and bulk is normal Sensation- Intact to light touch bilaterally. Extinction absent to light touch to DSS. Coordination: FTN intact bilaterally, HKS: no ataxia in BLE.No drift.  Gait- deferred   Discharge Diet       Diet   Diet Heart Room service appropriate? Yes; Fluid consistency: Thin   liquids  DISCHARGE PLAN Disposition: Home aspirin 81 mg daily and clopidogrel 75 mg daily for secondary stroke prevention for 3 months then clopidogrel 75 mg daily alone. Ongoing stroke risk factor control by Primary Care Physician at time of discharge Follow-up PCP Shelva Majestic, MD in 2 weeks. Follow-up in [facility] in 4 weeks with Dr. Teresa Coombs, office to schedule an appointment.  Follow up with ophthalmology for transient visual disturbance  35 minutes were spent preparing discharge.   Pt seen by Neuro NP/APP and later by MD. Note/plan to be edited by MD as needed.    Lynnae January, DNP, AGACNP-BC [facility] Please use AMION for contact information & EPIC for messaging.  ATTENDING NOTE: I reviewed above note and agree with the assessment and plan. Pt was seen and examined.   Wife at the bedside, pt neuro intact, on DAPT and statin and zetia. No acute event overnight. Had cardiology EP consult today for loop. And he got loop in the afternoon. He will d/c in good condition. Follow up at [facility] in 4 weeks with Dr. Teresa Coombs.   For detailed assessment and plan, please refer to above/below as I have made changes wherever appropriate.   Marvel Plan, MD PhD Stroke  Neurology 08/05/2023 5:49 PM

## 2023-08-03 NOTE — Progress Notes (Addendum)
STROKE TEAM PROGRESS NOTE   BRIEF HPI Mr. Ethan Payne is a 77 y.o. male with history of left MCA stroke status post TNK in May, hypertension and hyperlipidemia presenting with acute onset headache, confusion, toxic specific apraxia and aphasia.  According to his wife, he had a similar presentation of symptoms the day before admission which resolved spontaneously.  Patient was given TNK, and symptoms did improve.  MRI was positive for multiple small acute infarcts in left frontal and parietal lobes.  After patient's previous stroke, he was initially supposed to have a 30-day cardiac monitor, but this did not happen.  Plan is for loop recorder implantation this admission to monitor for A-fib given recurrent strokes.   SIGNIFICANT HOSPITAL EVENTS 8/30 TNK given 8/31 transferred out of ICU  INTERIM HISTORY/SUBJECTIVE Patient is seen in his room with several family members at the bedside.  He has been hemodynamically stable, and his neurological exam remains stable.  He states that overnight, he had an episode of seeing small red spots in his vision which lasted for about 5 minutes and resolved spontaneously.  He has had several of these episodes over the last week.   OBJECTIVE  CBC    Component Value Date/Time   WBC 8.7 08/03/2023 0537   RBC 4.82 08/03/2023 0537   HGB 14.8 08/03/2023 0537   HCT 42.6 08/03/2023 0537   PLT 209 08/03/2023 0537   MCV 88.4 08/03/2023 0537   MCH 30.7 08/03/2023 0537   MCHC 34.7 08/03/2023 0537   RDW 12.4 08/03/2023 0537   LYMPHSABS 1.7 08/01/2023 1310   MONOABS 0.8 08/01/2023 1310   EOSABS 0.7 (H) 08/01/2023 1310   BASOSABS 0.0 08/01/2023 1310    BMET    Component Value Date/Time   NA 135 08/03/2023 0537   K 3.6 08/03/2023 0537   CL 106 08/03/2023 0537   CO2 22 08/03/2023 0537   GLUCOSE 114 (H) 08/03/2023 0537   BUN 13 08/03/2023 0537   CREATININE 1.26 (H) 08/03/2023 0537   CALCIUM 8.8 (L) 08/03/2023 0537   GFRNONAA 60 (L) 08/03/2023 0537     IMAGING past 24 hours MR BRAIN WO CONTRAST  Result Date: 08/02/2023 CLINICAL DATA:  Stroke, follow up EXAM: MRI HEAD WITHOUT CONTRAST TECHNIQUE: Multiplanar, multiecho pulse sequences of the brain and surrounding structures were obtained without intravenous contrast. COMPARISON:  CT head 08/01/2023. FINDINGS: Brain: Multiple small acute infarcts in the left frontal and parietal lobes. Mild edema without significant mass effect. No evidence of acute hemorrhage, mass lesion, midline shift or hydrocephalus. Remote bilateral cerebellar infarcts. Vascular: Major arterial flow voids are maintained. Skull and upper cervical spine: Normal marrow signal. Sinuses/Orbits: Clear sinuses.  No acute orbital findings. Other: No mastoid effusions. IMPRESSION: Multiple small acute infarcts in the left frontal and parietal lobes. Electronically Signed   By: Feliberto Harts M.D.   On: 08/02/2023 12:03    Vitals:   08/02/23 2124 08/02/23 2316 08/03/23 0526 08/03/23 0857  BP: (!) 162/92 (!) 160/92 130/86 139/82  Pulse: 89 75  78  Resp:   18 18  Temp: 98.2 F (36.8 C) 98.4 F (36.9 C) 98.7 F (37.1 C) 98.3 F (36.8 C)  TempSrc: Oral Oral Oral Oral  SpO2: 97% 98% 97% 98%  Weight:      Height:         PHYSICAL EXAM General:  Alert, well-nourished, well-developed patient in no acute distress Psych:  Mood and affect appropriate for situation CV: Regular rate and rhythm on monitor  Respiratory:  Regular, unlabored respirations on room air  NEURO:  Mental Status: AA&Ox3, patient is able to give clear and coherent history Speech/Language: speech is without dysarthria or aphasia.  Naming, repetition, fluency, and comprehension intact.  Cranial Nerves:  II: PERRL. Visual fields full.  III, IV, VI: EOMI. Eyelids elevate symmetrically.  V: Sensation is intact to light touch and symmetrical to face.  VII: Subtle right facial droop VIII: hearing intact to voice. IX, X: Phonation is normal.  XII: tongue is  midline without fasciculations. Motor: 5/5 strength to all muscle groups tested.  Tone: is normal and bulk is normal Sensation- Intact to light touch bilaterally.  Coordination: FTN intact bilaterally.  No drift.  Gait- deferred   ASSESSMENT/PLAN  Acute Ischemic Infarct:  left MCA small infarcts s/p TNK, etiology: Likely cardioembolic, concerning occult afib Code Stroke CT head suspected small acute infarct in left frontal lobe. ASPECTS 9 CTA head & neck no LVO or hemodynamically significant stenosis MRI multiple small acute infarcts in left frontal and parietal lobes 2D Echo EF 60 to 65%, mild LVH, left atrium mildly to moderately dilated, no atrial level shunt LDL 101 HgbA1c 5.9 Recommend loop recorder prior to discharge VTE prophylaxis - SCDs aspirin 81 mg daily prior to admission, now on ASA and plavix DAPT for 3 weeks and then plavix alone. Therapy recommendations: Outpatient PT Disposition: Pending  Hx of Stroke/TIA 04/2023 admitted for right-sided weakness.  CT no acute abnormality.  Status post TNK.  CTA head and neck no LVO.  MRI showed left pre and postcentral gyrus infarcts.  EF 55 to 60%.  LDL 219, A1c 6.1, discharge to CIR with DAPT and statin. Recommended 30-day cardiac event monitoring but not done.  Hypertension Home meds: Lisinopril 10 mg daily Resume home lisinopril Long-term BP goal normotensive  Hyperlipidemia Home meds: Crestor 20 LDL 101, goal < 70 New rosuvastatin 20 mg daily, add Zetia 10 Continue statin and Zetia at discharge  Other Stroke Risk Factors Advanced age  Other Active Problems Visual disturbance - patient has had several episodes over the past week of seeing small red spots in his vision.  These episodes last about 5 minutes and resolve spontaneously.  Recommend outpatient ophthalmology follow-up.  Hospital day # 2  Patient seen by NP and then by MD, MD to edit note as needed. Cortney E Ernestina Columbia , MSN, AGACNP-BC [facility] See Amion for schedule and pager information 08/03/2023 11:47 AM   ATTENDING NOTE: I reviewed above note and agree with the assessment and plan. Pt was seen and examined.   Wife and daughter are at the bedside. Pt neuro stable, no acute event. However, he stated that since the stroke, he had several episodes seeing right dots on the entire visual field, could happen any time, no headache, lasting 5 min, comes and goes suddenly. He did not close one eye and the other to see if it happens in one eye or both eyes. No other associated symptoms. Currently no visual disturbance. Recommend to him if this happens again, close one and the other eye to see and also check BP. Not sure exactly etiology at this time. Continue the DAPT and statin and zetia. Pending loop tomorrow before discharge.  For detailed assessment and plan, please refer to above/below as I have made changes wherever appropriate.   Marvel Plan, MD PhD Stroke Neurology 08/03/2023 2:38 PM     To contact Stroke Continuity provider, please refer to [email or web address]. After hours, contact General  Neurology

## 2023-08-04 DIAGNOSIS — I639 Cerebral infarction, unspecified: Secondary | ICD-10-CM | POA: Diagnosis not present

## 2023-08-04 LAB — BASIC METABOLIC PANEL
Anion gap: 10 (ref 5–15)
BUN: 12 mg/dL (ref 8–23)
CO2: 20 mmol/L — ABNORMAL LOW (ref 22–32)
Calcium: 9.1 mg/dL (ref 8.9–10.3)
Chloride: 104 mmol/L (ref 98–111)
Creatinine, Ser: 1.12 mg/dL (ref 0.61–1.24)
GFR, Estimated: >60 mL/min (ref >60)
Glucose, Bld: 109 mg/dL — ABNORMAL HIGH (ref 70–99)
Potassium: 3.7 mmol/L (ref 3.5–5.1)
Sodium: 134 mmol/L — ABNORMAL LOW (ref 135–145)

## 2023-08-04 LAB — CBC
HCT: 41.1 % (ref 39.0–52.0)
Hemoglobin: 14.3 g/dL (ref 13.0–17.0)
MCH: 31.8 pg (ref 26.0–34.0)
MCHC: 34.8 g/dL (ref 30.0–36.0)
MCV: 91.3 fL (ref 80.0–100.0)
Platelets: 197 10*3/uL (ref 150–400)
RBC: 4.5 MIL/uL (ref 4.22–5.81)
RDW: 12.5 % (ref 11.5–15.5)
WBC: 8.8 10*3/uL (ref 4.0–10.5)
nRBC: 0 % (ref 0.0–0.2)

## 2023-08-04 NOTE — Progress Notes (Signed)
Occupational Therapy Treatment Patient Details Name: Ethan Payne MRN: [id number] DOB: May 27, 1946 Today's Date: 08/04/2023   History of present illness Ethan Payne is a 77 y.o. male admitted 08/01/23 with transient episodes of headache, confusion, task-specific apraxia and aphasia as well as R sided neglect. Administered tNKASE. MRI showed multiple small acute infarcts in the left frontal and parietal lobes. PMH significant for L MCA stroke s/p TNK in May 2024, HTN, HLD   OT comments  Pt making steady progress towards OT goals. Pt able to manage ADLs standing at sink with Modified Independence and Supervision for hallway mobility without AD. Pt with some R inattention, noted walking too close to items on R side in busy environment. Encouraged wife to walk on pt R side initially at DC for safety. Administered Short Blessed Test with score of 4 (0-4 indicating normal cognition) with minor deficits in memory. Encouraged initial assist with med mgmt at DC to ensure optimal med adherence.       If plan is discharge home, recommend the following:  Direct supervision/assist for financial management;Direct supervision/assist for medications management;Assist for transportation;Assistance with cooking/housework   Equipment Recommendations  None recommended by OT    Recommendations for Other Services      Precautions / Restrictions Precautions Precautions: Fall Precaution Comments: low fall risk Restrictions Weight Bearing Restrictions: No       Mobility Bed Mobility Overal bed mobility: Modified Independent                  Transfers Overall transfer level: Independent Equipment used: None Transfers: Sit to/from Stand Sit to Stand: Independent                 Balance Overall balance assessment: Mild deficits observed, not formally tested                                         ADL either performed or assessed with clinical judgement   ADL  Overall ADL's : Needs assistance/impaired     Grooming: Modified independent;Standing;Oral care Grooming Details (indicate cue type and reason): no issues locating items on R side, appropriate sequencing of task and problem solving                             Functional mobility during ADLs: Supervision/safety General ADL Comments: able to mobilize around unit, answer some cog questions during task (limited this due to [facility] and need to face therapist to understand speech w/o hearing aides)    Extremity/Trunk Assessment Upper Extremity Assessment Upper Extremity Assessment: Overall WFL for tasks assessed;Right hand dominant   Lower Extremity Assessment Lower Extremity Assessment: Defer to PT evaluation        Vision   Vision Assessment?: Yes;No apparent visual deficits   Perception Perception Perception: Impaired Preception Impairment Details: Inattention/Neglect Perception-Other Comments: minor R inattention as pt walking too close to items in hallway and grazing door entering room after hallway mobility   Praxis      Cognition Arousal: Alert Behavior During Therapy: [facility] for tasks assessed/performed Overall Cognitive Status: Impaired/Different from baseline Area of Impairment: Awareness, Problem solving                           Awareness: Emergent Problem Solving: Requires verbal cues, Slow processing General Comments: HOH but  functional; follows commands consistently. some slower processing but appropriately sequencing ADLs. Some R inattention w/ pt decreased insight into. Short blessed test with score of 4 (0-4 indicating normal cognition). Some minor deficits in working memory to name months backwards and unable to remember street name of address at end of test. Overall functional but encouraged initial family assist with med mgmt  in case meds changed at DC and due to R inattention        Exercises      Shoulder Instructions       General  Comments Wife at bedside, supportive, does endorse observing some R inattention at home PTA. educated to walk on R side for safety in community    Pertinent Vitals/ Pain       Pain Assessment Pain Assessment: Faces Faces Pain Scale: Hurts a little bit Pain Location: R knee Pain Descriptors / Indicators: Sore Pain Intervention(s): Monitored during session, Other (comment) (typically wears soft knee brace)  Home Living                                          Prior Functioning/Environment              Frequency  Min 1X/week        Progress Toward Goals  OT Goals(current goals can now be found in the care plan section)  Progress towards OT goals: Progressing toward goals  Acute Rehab OT Goals Patient Stated Goal: home soon OT Goal Formulation: With patient Time For Goal Achievement: 08/16/23 Potential to Achieve Goals: Good ADL Goals Pt Will Perform Grooming: with modified independence;standing Pt Will Perform Upper Body Dressing: with modified independence;standing Pt Will Perform Lower Body Dressing: with modified independence;sit to/from stand Pt Will Transfer to Toilet: with modified independence;ambulating Pt Will Perform Toileting - Clothing Manipulation and hygiene: with modified independence;sit to/from stand Pt/caregiver will Perform Home Exercise Program: Right Upper extremity;With written HEP provided;Independently Additional ADL Goal #1: Pt will utilize R hand for functional tasks as dominant hand 75% of the time  Plan      Co-evaluation                 AM-PAC OT "6 Clicks" Daily Activity     Outcome Measure   Help from another person eating meals?: None Help from another person taking care of personal grooming?: None Help from another person toileting, which includes using toliet, bedpan, or urinal?: A Little Help from another person bathing (including washing, rinsing, drying)?: A Little Help from another person to put on and  taking off regular upper body clothing?: None Help from another person to put on and taking off regular lower body clothing?: A Little 6 Click Score: 21    End of Session    OT Visit Diagnosis: Other abnormalities of gait and mobility (R26.89);Muscle weakness (generalized) (M62.81);Other symptoms and signs involving the nervous system (R29.898);Other symptoms and signs involving cognitive function   Activity Tolerance Patient tolerated treatment well   Patient Left in bed;with call bell/phone within reach;with family/visitor present   Nurse Communication Mobility status        Time: 1610-9604 OT Time Calculation (min): 20 min  Charges: OT General Charges $OT Visit: 1 Visit OT Treatments $Self Care/Home Management : 8-22 mins  Bradd Canary, OTR/L Acute Rehab Services Office: [phone]   Lorre Munroe 08/04/2023, 8:45 AM

## 2023-08-04 NOTE — Progress Notes (Signed)
STROKE TEAM PROGRESS NOTE   BRIEF HPI Mr. Ethan Payne is a 76 y.o. male with history of left MCA stroke status post TNK in May, hypertension and hyperlipidemia presenting with acute onset headache, confusion, toxic specific apraxia and aphasia.  According to his wife, he had a similar presentation of symptoms the day before admission which resolved spontaneously.  Patient was given TNK, and symptoms did improve.  MRI was positive for multiple small acute infarcts in left frontal and parietal lobes.  After patient's previous stroke, he was initially supposed to have a 30-day cardiac monitor, but this did not happen.  Plan is for loop recorder implantation this admission to monitor for A-fib given recurrent strokes.   SIGNIFICANT HOSPITAL EVENTS 8/30 TNK given 8/31 transferred out of ICU  INTERIM HISTORY/SUBJECTIVE Wife and daughter are at the bedside. Pt doing well, no recurrence of the visual disturbance. Pending loop tomorrow.   OBJECTIVE  CBC    Component Value Date/Time   WBC 8.8 08/04/2023 0357   RBC 4.50 08/04/2023 0357   HGB 14.3 08/04/2023 0357   HCT 41.1 08/04/2023 0357   PLT 197 08/04/2023 0357   MCV 91.3 08/04/2023 0357   MCH 31.8 08/04/2023 0357   MCHC 34.8 08/04/2023 0357   RDW 12.5 08/04/2023 0357   LYMPHSABS 1.7 08/01/2023 1310   MONOABS 0.8 08/01/2023 1310   EOSABS 0.7 (H) 08/01/2023 1310   BASOSABS 0.0 08/01/2023 1310    BMET    Component Value Date/Time   NA 134 (L) 08/04/2023 0357   K 3.7 08/04/2023 0357   CL 104 08/04/2023 0357   CO2 20 (L) 08/04/2023 0357   GLUCOSE 109 (H) 08/04/2023 0357   BUN 12 08/04/2023 0357   CREATININE 1.12 08/04/2023 0357   CALCIUM 9.1 08/04/2023 0357   GFRNONAA >60 08/04/2023 0357    IMAGING past 24 hours No results found.  Vitals:   08/04/23 0020 08/04/23 0346 08/04/23 0750 08/04/23 1100  BP: (!) 163/90 (!) 146/89 (!) 142/84 (!) 140/82  Pulse: 82 80 87 88  Resp: 16 17 16 17   Temp: 98.2 F (36.8 C) 98.4 F  (36.9 C) 98.5 F (36.9 C) 98.2 F (36.8 C)  TempSrc: Oral Oral Oral Oral  SpO2: 99% 97% 98% 99%  Weight:      Height:         PHYSICAL EXAM General:  Alert, well-nourished, well-developed patient in no acute distress Psych:  Mood and affect appropriate for situation CV: Regular rate and rhythm on monitor Respiratory:  Regular, unlabored respirations on room air  NEURO:  Mental Status: AA&Ox3, patient is able to give clear and coherent history Speech/Language: speech is without dysarthria or aphasia.  Naming, repetition, fluency, and comprehension intact.  Cranial Nerves:  II: PERRL. Visual fields full.  III, IV, VI: EOMI. Eyelids elevate symmetrically.  V: Sensation is intact to light touch and symmetrical to face.  VII: Subtle right facial droop VIII: hearing intact to voice. IX, X: Phonation is normal.  XII: tongue is midline without fasciculations. Motor: 5/5 strength to all muscle groups tested.  Tone: is normal and bulk is normal Sensation- Intact to light touch bilaterally.  Coordination: FTN intact bilaterally.  No drift.  Gait- deferred   ASSESSMENT/PLAN  Acute Ischemic Infarct:  left MCA small infarcts s/p TNK, etiology: Likely cardioembolic, concerning occult afib Code Stroke CT head suspected small acute infarct in left frontal lobe. ASPECTS 9 CTA head & neck no LVO or hemodynamically significant stenosis MRI multiple small  acute infarcts in left frontal and parietal lobes 2D Echo EF 60 to 65%, mild LVH, left atrium mildly to moderately dilated, no atrial level shunt LDL 101 HgbA1c 5.9 Recommend loop recorder prior to discharge VTE prophylaxis - SCDs aspirin 81 mg daily prior to admission, now on ASA and plavix DAPT for 3 weeks and then plavix alone. Therapy recommendations: Outpatient PT Disposition: Pending  Hx of Stroke/TIA 04/2023 admitted for right-sided weakness.  CT no acute abnormality.  Status post TNK.  CTA head and neck no LVO.  MRI showed left  pre and postcentral gyrus infarcts.  EF 55 to 60%.  LDL 219, A1c 6.1, discharge to CIR with DAPT and statin. Recommended 30-day cardiac event monitoring but not done.  Hypertension Home meds: Lisinopril 10 mg daily Resume home lisinopril Long-term BP goal normotensive  Hyperlipidemia Home meds: Crestor 20 LDL 101, goal < 70 New rosuvastatin 20 mg daily, add Zetia 10 Continue statin and Zetia at discharge  Other Stroke Risk Factors Advanced age  Other Active Problems Visual disturbance - patient has had several episodes over the past week of seeing small red spots in his vision.  These episodes last about 5 minutes and resolve spontaneously.  Recommend outpatient ophthalmology follow-up - no recurrence overnight  Hospital day # 3  Marvel Plan, MD PhD Stroke Neurology 08/04/2023 12:12 PM      To contact Stroke Continuity provider, please refer to [email or web address]. After hours, contact General Neurology

## 2023-08-05 ENCOUNTER — Inpatient Hospital Stay (HOSPITAL_COMMUNITY): Payer: Medicare Other

## 2023-08-05 ENCOUNTER — Encounter (HOSPITAL_COMMUNITY): Payer: Self-pay | Admitting: Cardiovascular Disease

## 2023-08-05 ENCOUNTER — Encounter (HOSPITAL_COMMUNITY)
Admission: EM | Disposition: A | Discharge status: Home / Self Care | Payer: Self-pay | Source: Home / Self Care | Attending: Neurology

## 2023-08-05 DIAGNOSIS — I639 Cerebral infarction, unspecified: Secondary | ICD-10-CM

## 2023-08-05 HISTORY — PX: LOOP RECORDER INSERTION: EP1214

## 2023-08-05 LAB — BASIC METABOLIC PANEL
Anion gap: 10 (ref 5–15)
BUN: 13 mg/dL (ref 8–23)
CO2: 25 mmol/L (ref 22–32)
Calcium: 9.9 mg/dL (ref 8.9–10.3)
Chloride: 102 mmol/L (ref 98–111)
Creatinine, Ser: 1.2 mg/dL (ref 0.61–1.24)
GFR, Estimated: >60 mL/min (ref >60)
Glucose, Bld: 118 mg/dL — ABNORMAL HIGH (ref 70–99)
Potassium: 4 mmol/L (ref 3.5–5.1)
Sodium: 137 mmol/L (ref 135–145)

## 2023-08-05 LAB — CBC
HCT: 44.7 % (ref 39.0–52.0)
Hemoglobin: 15.4 g/dL (ref 13.0–17.0)
MCH: 31.7 pg (ref 26.0–34.0)
MCHC: 34.5 g/dL (ref 30.0–36.0)
MCV: 92 fL (ref 80.0–100.0)
Platelets: 211 10*3/uL (ref 150–400)
RBC: 4.86 MIL/uL (ref 4.22–5.81)
RDW: 12.5 % (ref 11.5–15.5)
WBC: 8.6 10*3/uL (ref 4.0–10.5)
nRBC: 0 % (ref 0.0–0.2)

## 2023-08-05 SURGERY — LOOP RECORDER INSERTION

## 2023-08-05 MED ORDER — LIDOCAINE-EPINEPHRINE 1 %-1:100000 IJ SOLN
INTRAMUSCULAR | Status: DC | PRN
  Administered 2023-08-05: 5 mL

## 2023-08-05 MED ORDER — ASPIRIN 81 MG PO TBEC: 81.0000 mg | DELAYED_RELEASE_TABLET | Freq: Every day | ORAL | 0 refills | Status: DC

## 2023-08-05 MED ORDER — CLOPIDOGREL BISULFATE 75 MG PO TABS: 75.0000 mg | ORAL_TABLET | Freq: Every day | ORAL | 0 refills | Status: DC

## 2023-08-05 MED ORDER — EZETIMIBE 10 MG PO TABS: 10.0000 mg | ORAL_TABLET | Freq: Every day | ORAL | 0 refills | Status: DC

## 2023-08-05 MED ORDER — LIDOCAINE-EPINEPHRINE 1 %-1:100000 IJ SOLN
INTRAMUSCULAR | Status: AC
  Filled 2023-08-05: qty 1

## 2023-08-05 SURGICAL SUPPLY — 2 items
PACK LOOP INSERTION (CUSTOM PROCEDURE TRAY) ×2 IMPLANT
SYSTEM MONITOR REVEAL LINQ II (Prosthesis & Implant Heart) IMPLANT

## 2023-08-05 NOTE — Plan of Care (Signed)
  Problem: Education: Goal: Knowledge of disease or condition will improve Outcome: Progressing   Problem: Ischemic Stroke/TIA Tissue Perfusion: Goal: Complications of ischemic stroke/TIA will be minimized Outcome: Progressing   Problem: Coping: Goal: Will verbalize positive feelings about self Outcome: Progressing   Problem: Health Behavior/Discharge Planning: Goal: Ability to manage health-related needs will improve Outcome: Progressing   Problem: Self-Care: Goal: Ability to participate in self-care as condition permits will improve Outcome: Progressing   Problem: Nutrition: Goal: Risk of aspiration will decrease Outcome: Progressing Goal: Dietary intake will improve Outcome: Progressing

## 2023-08-05 NOTE — Care Management Important Message (Signed)
Important Message  Patient Details  Name: Ethan Payne MRN: [id number] Date of Birth: January 24, 1946   Medicare Important Message Given:  Yes     Sherilyn Banker 08/05/2023, 12:32 PM

## 2023-08-05 NOTE — Evaluation (Signed)
Speech Language Pathology Evaluation Patient Details Name: PANTELIS CASTRILLO MRN: [id number] DOB: [birth year] Today's Date: 08/05/2023 Time: 3244-0102 SLP Time Calculation (min) (ACUTE ONLY): 25 min  Problem List:  Patient Active Problem List   Diagnosis Date Noted   Acute embolic stroke (HCC) 04/24/2023   Acute ischemic stroke (HCC) 04/21/2023   Paroxysmal atrial fibrillation (HCC) 12/21/2021   Leg length difference, acquired 01/07/2018   Rotator cuff syndrome of right shoulder 12/08/2017   BPH associated with nocturia 04/10/2015   Health care maintenance 02/18/2015   Erectile dysfunction 02/18/2015   CAD (coronary artery disease) 02/17/2015   Heel pain 03/22/2011   GERD (gastroesophageal reflux disease) 03/22/2011   Osteoarthritis of right knee 04/18/2010   s/p excision of Atrial myxoma 02/04/2008   Hyperlipidemia 02/04/2008   Essential hypertension 02/04/2008   Past Medical History:  Past Medical History:  Diagnosis Date   Acute prostatitis 02/04/2008   ATRIAL MYXOMA 02/04/2008   Closed fracture of four ribs 11/01/2009   HYPERLIPIDEMIA 02/04/2008   HYPERTENSION 02/04/2008   Osteoarthritis of right knee 04/18/2010   Past Surgical History:  Past Surgical History:  Procedure Laterality Date   CATARACT EXTRACTION Left 08/03/2019   CORONARY ARTERY BYPASS GRAFT  2007   x2 Saph vein   COX-MAZE MICROWAVE ABLATION  2007   EXCISION OF ATRIAL MYXOMA  2007   Dr C. Cornelius Moras   HPI:  Pt is a 77 y.o. male who presented on 08/01/23 with an acute headache, confusion, and word finding difficulties. Upon admission, TNK administered and MRI was positive for multiple small acute infarcts in left frontal and parietal lobes. Significant PMH includes L MCA (May 2024), HTN, and HLD.   Assessment / Plan / Recommendation Clinical Impression  Pt was seen by SLP for speech language evaluation post stroke. No evidence of speech, language, or cognitive impairment observed. Pt indicated that he feels as though he  is functioning at baseline at this time. He noted that at the onset of stroke sx, he expereinced word-finding difficulties and articulatory errors that are no longer present. SLP conferred with pt wife following evaluation and she confirmed these sx are no longer present. All orientation questions answered with accuracy. Picture description and picture identification completed with 100% accuracy. No generative or semantic impairments observed. Articluation was accurate with no evidence of motor speech impairments present. No further SLP f/u necessary at this time.    SLP Assessment  SLP Recommendation/Assessment: Patient does not need any further Speech Lanaguage Pathology Services SLP Visit Diagnosis: Cognitive communication deficit (R41.841);Aphasia (R47.01)    Recommendations for follow up therapy are one component of a multi-disciplinary discharge planning process, led by the attending physician.  Recommendations may be updated based on patient status, additional functional criteria and insurance authorization.    Follow Up Recommendations  No SLP follow up    Assistance Recommended at Discharge  None  Functional Status Assessment Patient has not had a recent decline in their functional status  Frequency and Duration           SLP Evaluation Cognition  Overall Cognitive Status: Within Functional Limits for tasks assessed Arousal/Alertness: Awake/alert Orientation Level: Oriented to person;Oriented to place;Oriented to time Month: September Memory: Appears intact Safety/Judgment: Appears intact       Comprehension  Auditory Comprehension Overall Auditory Comprehension: Appears within functional limits for tasks assessed Yes/No Questions: Not tested Commands: Not tested Conversation: Complex Visual Recognition/Discrimination Discrimination: Within Function Limits Reading Comprehension Reading Status: Not tested    Expression Expression  Primary Mode of Expression:  Verbal Verbal Expression Overall Verbal Expression: Appears within functional limits for tasks assessed Initiation: No impairment Automatic Speech: Name Level of Generative/Spontaneous Verbalization: Conversation Naming: No impairment Pragmatics: No impairment Non-Verbal Means of Communication: Not applicable   Oral / Motor  Oral Motor/Sensory Function Overall Oral Motor/Sensory Function: Within functional limits Motor Speech Overall Motor Speech: Appears within functional limits for tasks assessed Respiration: Within functional limits Phonation: Normal Resonance: Within functional limits Articulation: Within functional limitis Intelligibility: Intelligible Motor Planning: Witnin functional limits Motor Speech Errors: Not applicable            Marline Backbone, Senaida Lange., Speech Therapy Student

## 2023-08-05 NOTE — Plan of Care (Signed)
  Problem: Education: Goal: Knowledge of disease or condition will improve Outcome: Progressing   Problem: Ischemic Stroke/TIA Tissue Perfusion: Goal: Complications of ischemic stroke/TIA will be minimized Outcome: Progressing   Problem: Coping: Goal: Will verbalize positive feelings about self Outcome: Progressing   Problem: Health Behavior/Discharge Planning: Goal: Ability to manage health-related needs will improve Outcome: Progressing   Problem: Self-Care: Goal: Ability to participate in self-care as condition permits will improve Outcome: Progressing   Problem: Nutrition: Goal: Risk of aspiration will decrease Outcome: Progressing Goal: Dietary intake will improve Outcome: Progressing   Problem: Education: Goal: Knowledge of General Education information will improve Description: Including pain rating scale, medication(s)/side effects and non-pharmacologic comfort measures Outcome: Progressing   Problem: Clinical Measurements: Goal: Ability to maintain clinical measurements within normal limits will improve Outcome: Progressing Goal: Will remain free from infection Outcome: Progressing Goal: Diagnostic test results will improve Outcome: Progressing Goal: Respiratory complications will improve Outcome: Progressing Goal: Cardiovascular complication will be avoided Outcome: Progressing   Problem: Activity: Goal: Risk for activity intolerance will decrease Outcome: Progressing   Problem: Nutrition: Goal: Adequate nutrition will be maintained Outcome: Progressing   Problem: Coping: Goal: Level of anxiety will decrease Outcome: Progressing   Problem: Elimination: Goal: Will not experience complications related to bowel motility Outcome: Progressing Goal: Will not experience complications related to urinary retention Outcome: Progressing   Problem: Pain Managment: Goal: General experience of comfort will improve Outcome: Progressing   Problem:  Safety: Goal: Ability to remain free from injury will improve Outcome: Progressing   Problem: Skin Integrity: Goal: Risk for impaired skin integrity will decrease Outcome: Progressing

## 2023-08-05 NOTE — Progress Notes (Signed)
Physical Therapy Treatment Patient Details Name: Ethan Payne MRN: [id number] DOB: [birth year] Today's Date: 08/05/2023   History of Present Illness Ethan Payne is a 77 y.o. male admitted 08/01/23 with transient episodes of headache, confusion, task-specific apraxia and aphasia as well as R sided neglect. Administered tNKASE. MRI showed multiple small acute infarcts in the left frontal and parietal lobes. PMH significant for L MCA stroke s/p TNK in May 2024, HTN, HLD    PT Comments  Pt progressing well towards physical therapy goals.  Pt able to ambulate well on extended walk in hallway without gross unsteadiness or LOB. Pt states he feels back to baseline of function. Recalls details of last PT session 3 days ago without cues. Current recommendations remain appropriate. Will continue to follow and progress as able per PT POC.    If plan is discharge home, recommend the following: Assistance with cooking/housework;Assist for transportation;Help with stairs or ramp for entrance   Can travel by private vehicle        Equipment Recommendations  None recommended by PT    Recommendations for Other Services       Precautions / Restrictions Precautions Precautions: Fall Precaution Comments: low fall risk Restrictions Weight Bearing Restrictions: No     Mobility  Bed Mobility Overal bed mobility: Modified Independent Bed Mobility: Supine to Sit           General bed mobility comments: No assist to transition to EOB at start of session. Reaching out for wife's hand to assist but no real assist required to complete.    Transfers Overall transfer level: Independent Equipment used: None Transfers: Sit to/from [employer] transfer comment: Good power up to full stand without assistance. Pt demonstrated good hand placement on seated surface for safety.    Ambulation/Gait Ambulation/Gait assistance: Supervision Gait Distance (Feet): 500 Feet Assistive  device: None Gait Pattern/deviations: Step-through pattern, Decreased weight shift to right, Decreased stance time - right, Decreased stride length Gait velocity: Decreased Gait velocity interpretation: 1.31 - 2.62 ft/sec, indicative of limited community ambulator   General Gait Details: Decreased gait speed but without overt LOB. Pt steady with directional changes and turns.   Stairs         General stair comments: Pt declined stair training this session and reports he felt comfortable. Remembered he had only gone up 3 stairs with previous therapist and states "I could have done the whole thing if she'd had let me."   Wheelchair Mobility     Tilt Bed    Modified Rankin (Stroke Patients Only) Modified Rankin (Stroke Patients Only) Pre-Morbid Rankin Score: No significant disability Modified Rankin: Slight disability     Balance Overall balance assessment: Mild deficits observed, not formally tested                               Standardized Balance Assessment Standardized Balance Assessment : Dynamic Gait Index          Cognition Arousal: Alert Behavior During Therapy: WFL for tasks assessed/performed Overall Cognitive Status: Impaired/Different from baseline Area of Impairment: Awareness, Problem solving                           Awareness: Emergent Problem Solving: Requires verbal cues, Slow processing          Exercises  General Comments        Pertinent Vitals/Pain Pain Assessment Pain Assessment: Faces Faces Pain Scale: No hurt Pain Intervention(s): Limited activity within patient's tolerance, Monitored during session, Repositioned    Home Living                          Prior Function            PT Goals (current goals can now be found in the care plan section) Acute Rehab PT Goals Patient Stated Goal: to improve PT Goal Formulation: With patient Time For Goal Achievement: 08/16/23 Potential to  Achieve Goals: Good Progress towards PT goals: Progressing toward goals    Frequency    Min 1X/week      PT Plan      Co-evaluation              AM-PAC PT "6 Clicks" Mobility   Outcome Measure  Help needed turning from your back to your side while in a flat bed without using bedrails?: None Help needed moving from lying on your back to sitting on the side of a flat bed without using bedrails?: A Little Help needed moving to and from a bed to a chair (including a wheelchair)?: A Little Help needed standing up from a chair using your arms (e.g., wheelchair or bedside chair)?: A Little Help needed to walk in hospital room?: A Little Help needed climbing 3-5 steps with a railing? : A Little 6 Click Score: 19    End of Session Equipment Utilized During Treatment: Gait belt Activity Tolerance: Patient tolerated treatment well Patient left: in chair;with call bell/phone within reach;with family/visitor present Nurse Communication: Mobility status PT Visit Diagnosis: Unsteadiness on feet (R26.81);Other abnormalities of gait and mobility (R26.89);Muscle weakness (generalized) (M62.81);Other symptoms and signs involving the nervous system (R29.898)     Time: 1610-9604 PT Time Calculation (min) (ACUTE ONLY): 10 min  Charges:    $Gait Training: 8-22 mins PT General Charges $$ ACUTE PT VISIT: 1 Visit                     Conni Slipper, PT, DPT Acute Rehabilitation Services Secure Chat Preferred Office: [phone]    Marylynn Pearson 08/05/2023, 2:48 PM

## 2023-08-05 NOTE — Progress Notes (Signed)
Lower extremity venous duplex completed. Please see CV Procedures for preliminary results.  Shona Simpson, RVT 08/05/23 10:40 AM

## 2023-08-05 NOTE — Discharge Instructions (Addendum)
Care After Your Loop Recorder  You have a [employer] Loop Recorder   Monitor your cardiac device site for redness, swelling, and drainage. Call the device clinic at [phone] if you experience these symptoms or fever/chills.  If you notice bleeding from your site, hold firm, but gently pressure with two fingers for 5 minutes. Dried blood on the steri-strips when removing the outer bandage is normal.   Keep the large square bandage on your site for 24 hours and then you may remove it yourself. Keep the steri-strips underneath in place.   You may shower after 72 hours / 3 days from your procedure with the steri-strips in place. They will usually fall off on their own, or may be removed after 10 days. Pat dry.   Avoid lotions, ointments, or perfumes over your incision until it is well-healed.  Please do not submerge in water until your site is completely healed.   Your device is MRI compatible.   Remote monitoring is used to monitor your cardiac device from home. This monitoring is scheduled every month by our office. It allows us to keep an eye on the function of your device to ensure it is working properly.    

## 2023-08-05 NOTE — Consult Note (Addendum)
ELECTROPHYSIOLOGY CONSULT NOTE  Patient ID: Ethan Payne MRN: [id number], DOB/AGE: 1946-08-24   Admit date: 08/01/2023 Date of Consult: 08/05/2023  Primary Physician: Shelva Majestic, MD Primary Cardiologist: Dr. Ladona Ridgel  Reason for Consultation: Cryptogenic stroke ; recommendations regarding Implantable Loop Recorder  History of Present Illness Ethan Payne was admitted on 08/01/2023 with acute headache, confusion aphasia (had transient symptoms the day prior as well) >> TNK >> MRI + stroke.    PMHx includes: CAD (remote CABG, MAZE, myxoma excision in 2007),  HTN, HLD, stroke  PAFib that was thought to be 2/2 LA myxoma with no recurrent arrhythmia for 10 years or so  Hx of stroke in May, planned/ordered for 30 day monitor not completed  Neurology notes:  left MCA small infarcts s/p TNK, etiology: Likely cardioembolic, concerning occult afib .  he has undergone workup for stroke including echocardiogram and carotid dopplers.  The patient has been monitored on telemetry which has demonstrated sinus rhythm with no arrhythmias.    Neurology has deferred TEE  Echocardiogram this admission demonstrated  1. Left ventricular ejection fraction, by estimation, is 60 to 65%. The  left ventricle has normal function. The left ventricle has no regional  wall motion abnormalities. There is mild left ventricular hypertrophy.  Indeterminate diastolic filling due to  E-A fusion.   2. Right ventricular systolic function is normal. The right ventricular  size is normal. Tricuspid regurgitation signal is inadequate for assessing  PA pressure.   3. Left atrial size was mild to moderately dilated.   4. No evidence of mitral valve regurgitation.   5. Aortic valve regurgitation is trivial. Aortic valve  sclerosis/calcification is present, without any evidence of aortic  stenosis.   6. The inferior vena cava is normal in size with greater than 50%  respiratory variability, suggesting right  atrial pressure of 3 mmHg.   Comparison(s): No significant change from prior study. .     Lab work is reviewed.   Prior to admission, the patient denies chest pain, shortness of breath, dizziness, palpitations, or syncope.  They are recovering from their stroke with plans to home at discharge.   Past Medical History:  Diagnosis Date   Acute prostatitis 02/04/2008   ATRIAL MYXOMA 02/04/2008   Closed fracture of four ribs 11/01/2009   HYPERLIPIDEMIA 02/04/2008   HYPERTENSION 02/04/2008   Osteoarthritis of right knee 04/18/2010     Surgical History:  Past Surgical History:  Procedure Laterality Date   CATARACT EXTRACTION Left 08/03/2019   CORONARY ARTERY BYPASS GRAFT  2007   x2 Saph vein   COX-MAZE MICROWAVE ABLATION  2007   EXCISION OF ATRIAL MYXOMA  2007   Dr C. Cornelius Moras     Medications Prior to Admission  Medication Sig Dispense Refill Last Dose   aspirin EC 81 MG tablet Take 1 tablet (81 mg total) by mouth daily. Swallow whole. 30 tablet 0 07/31/2023   calcium carbonate (TUMS - DOSED IN MG ELEMENTAL CALCIUM) 500 MG chewable tablet Chew 1,000 mg by mouth in the morning and at bedtime.   07/31/2023   ibuprofen (ADVIL) 200 MG tablet Take 400 mg by mouth every other day.   07/31/2023   lisinopril (ZESTRIL) 20 MG tablet Take 1 tablet (20 mg total) by mouth daily. (Patient taking differently: Take 10 mg by mouth daily.) 90 tablet 3 07/31/2023   rosuvastatin (CRESTOR) 20 MG tablet Take 1 tablet (20 mg total) by mouth daily. 90 tablet 3 07/31/2023   amLODipine (  NORVASC) 5 MG tablet Take 1 tablet (5 mg total) by mouth daily. (Patient not taking: Reported on 08/01/2023) 90 tablet 3 Not Taking    Inpatient Medications:   aspirin EC  81 mg Oral Daily   clopidogrel  75 mg Oral Daily   ezetimibe  10 mg Oral Daily   labetalol  20 mg Intravenous Once   lisinopril  10 mg Oral Daily   rosuvastatin  20 mg Oral Daily    Allergies:  Allergies  Allergen Reactions   Hydrocodone Palpitations    After rib  fractures. Hydrocodone-States had shortness of breath, 911 was called    Loratadine Anxiety and Other (See Comments)    Heart speeds up     Social History   Socioeconomic History   Marital status: Married    Spouse name: Not on file   Number of children: Not on file   Years of education: Not on file   Highest education level: Not on file  Occupational History   Occupation: married  Tobacco Use   Smoking status: Never   Smokeless tobacco: Never  Substance and Sexual Activity   Alcohol use: No   Drug use: No   Sexual activity: Yes  Other Topics Concern   Not on file  Social History Narrative   Married will be 50 years in 2019 (wife seen at another practice). 2 children-daughter unmarried, professional student 16 and son 55 in 2016. 2 grandsons-17 and 41 in 2019.       Still works as Product/process development scientist, does his own carpentry work      Hobbies: fishing   Social Determinants of Corporate investment banker Strain: Low Risk  (03/24/2023)   Overall Financial Resource Strain (CARDIA)    Difficulty of Paying Living Expenses: Not hard at all  Food Insecurity: No Food Insecurity (04/23/2023)   Hunger Vital Sign    Worried About Running Out of Food in the Last Year: Never true    Ran Out of Food in the Last Year: Never true  Transportation Needs: No Transportation Needs (04/23/2023)   PRAPARE - Administrator, Civil Service (Medical): No    Lack of Transportation (Non-Medical): No  Physical Activity: Insufficiently Active (03/24/2023)   Exercise Vital Sign    Days of Exercise per Week: 3 days    Minutes of Exercise per Session: 30 min  Stress: No Stress Concern Present (03/24/2023)   [employer] of Occupational Health - Occupational Stress Questionnaire    Feeling of Stress : Not at all  Social Connections: Moderately Isolated (03/24/2023)   Social Connection and Isolation Panel [NHANES]    Frequency of Communication with Friends and Family: More than three times  a week    Frequency of Social Gatherings with Friends and Family: More than three times a week    Attends Religious Services: Never    Database administrator or Organizations: No    Attends Banker Meetings: Never    Marital Status: Married  Catering manager Violence: Not At Risk (04/23/2023)   Humiliation, Afraid, Rape, and Kick questionnaire    Fear of Current or Ex-Partner: No    Emotionally Abused: No    Physically Abused: No    Sexually Abused: No     Family History  Problem Relation Age of Onset   Stroke Mother    Heart disease Father        MI 38   Hypertension Father    Ovarian cancer Sister  Review of Systems: All other systems reviewed and are otherwise negative except as noted above.  Physical Exam: Vitals:   08/04/23 2357 08/05/23 0349 08/05/23 0829 08/05/23 1207  BP: (!) 158/89 (!) 158/92 (!) 140/73 (!) 152/79  Pulse: 77 72 98 87  Resp: 17 17 16 20   Temp: 98 F (36.7 C) 98.1 F (36.7 C) 98 F (36.7 C) 97.8 F (36.6 C)  TempSrc: Oral Oral Oral Oral  SpO2: 96% 99% 98% 98%  Weight:      Height:        GEN- The patient is well appearing, alert and oriented x 3 today.   Head- normocephalic, atraumatic Eyes-  Sclera clear, conjunctiva pink Ears- hearing intact Oropharynx- clear Neck- supple Lungs- Clear to ausculation bilaterally, normal work of breathing Heart- Regular rate and rhythm, no murmurs, rubs or gallops  GI- soft, NT, ND, + BS Extremities- no clubbing, cyanosis, or edema MS- no significant deformity or atrophy Skin- no rash or lesion Psych- euthymic mood, full affect   Labs:   Lab Results  Component Value Date   WBC 8.6 08/05/2023   HGB 15.4 08/05/2023   HCT 44.7 08/05/2023   MCV 92.0 08/05/2023   PLT 211 08/05/2023    Recent Labs  Lab 08/01/23 1310 08/01/23 1314 08/05/23 0806  NA 137   < > 137  K 4.1   < > 4.0  CL 97*   < > 102  CO2 25   < > 25  BUN 11   < > 13  CREATININE 1.30*   < > 1.20  CALCIUM  10.0   < > 9.9  PROT 8.0  --   --   BILITOT 1.0  --   --   ALKPHOS 39  --   --   ALT 29  --   --   AST 25  --   --   GLUCOSE 123*   < > 118*   < > = values in this interval not displayed.   No results found for: "CKTOTAL", "CKMB", "CKMBINDEX", "TROPONINI" Lab Results  Component Value Date   CHOL 162 08/02/2023   CHOL 289 (H) 04/22/2023   CHOL 222 (H) 11/12/2022   Lab Results  Component Value Date   HDL 42 08/02/2023   HDL 50 04/22/2023   HDL 51.20 11/12/2022   Lab Results  Component Value Date   LDLCALC 101 (H) 08/02/2023   LDLCALC 219 (H) 04/22/2023   LDLCALC 151 (H) 11/12/2022   Lab Results  Component Value Date   TRIG 94 08/02/2023   TRIG 100 04/22/2023   TRIG 97.0 11/12/2022   Lab Results  Component Value Date   CHOLHDL 3.9 08/02/2023   CHOLHDL 5.8 04/22/2023   CHOLHDL 4 11/12/2022   Lab Results  Component Value Date   LDLDIRECT 87.0 06/16/2023   LDLDIRECT 103.0 02/15/2020   LDLDIRECT 203.9 10/07/2011    Lab Results  Component Value Date   DDIMER (H) 10/18/2009    6.35        AT THE INHOUSE ESTABLISHED CUTOFF VALUE OF 0.48 ug/mL FEU, THIS ASSAY HAS BEEN DOCUMENTED IN THE LITERATURE TO HAVE A SENSITIVITY AND NEGATIVE PREDICTIVE VALUE OF AT LEAST 98 TO 99%.  THE TEST RESULT SHOULD BE CORRELATED WITH AN ASSESSMENT OF THE CLINICAL PROBABILITY OF DVT / VTE.     Radiology/Studies:  VAS [place] LOWER EXTREMITY VENOUS (DVT) Result Date: 08/05/2023  Lower Venous DVT Study Patient Name:  CHURCHILL GERE  Date of Exam:  08/05/2023 Medical Rec #: [id number]        Accession #:    [id number] Date of Birth: March 11, 1946       Patient Gender: M Patient Age:   31 years Exam Location:  [facility] Procedure:      VAS [place] LOWER EXTREMITY VENOUS (DVT) Referring Phys: Scheryl Marten XU --------------------------------------------------------------------------------  Indications: Stroke.  Risk Factors: None identified. Comparison Study: No prior study Performing Technologist:  Shona Simpson  Examination Guidelines: A complete evaluation includes B-mode imaging, spectral Doppler, color Doppler, and power Doppler as needed of all accessible portions of each vessel. Bilateral testing is considered an integral part of a complete examination. Limited examinations for reoccurring indications may be performed as noted. The reflux portion of the exam is performed with the patient in reverse Trendelenburg. Summary: BILATERAL: - No evidence of deep vein thrombosis seen in the lower extremities, bilaterally. - RIGHT: - A cystic structure is found in the popliteal fossa.  LEFT: - No cystic structure found in the popliteal fossa.  *See table(s) above for measurements and observations.    Preliminary    MR BRAIN WO CONTRAST Result Date: 08/02/2023 CLINICAL DATA:  Stroke, follow up EXAM: MRI HEAD WITHOUT CONTRAST TECHNIQUE: Multiplanar, multiecho pulse sequences of the brain and surrounding structures were obtained without intravenous contrast. COMPARISON:  CT head 08/01/2023. FINDINGS: Brain: Multiple small acute infarcts in the left frontal and parietal lobes. Mild edema without significant mass effect. No evidence of acute hemorrhage, mass lesion, midline shift or hydrocephalus. Remote bilateral cerebellar infarcts. Vascular: Major arterial flow voids are maintained. Skull and upper cervical spine: Normal marrow signal. Sinuses/Orbits: Clear sinuses.  No acute orbital findings. Other: No mastoid effusions. IMPRESSION: Multiple small acute infarcts in the left frontal and parietal lobes. Electronically Signed   By: Feliberto Harts M.D.   On: 08/02/2023 12:03    CT HEAD WO CONTRAST ( ) Result Date: 08/01/2023 CLINICAL DATA:  Headache, rash EXAM: CT HEAD WITHOUT CONTRAST TECHNIQUE: Contiguous axial images were obtained from the base of the skull through the vertex without intravenous contrast. RADIATION DOSE REDUCTION: This exam was performed according to the departmental dose-optimization  program which includes automated exposure control, adjustment of the mA and/or kV according to patient size and/or use of iterative reconstruction technique. COMPARISON:  08/01/2023 at 1:21 p.m. FINDINGS: Brain: The small area of left frontal subcortical hypodensity seen on prior exam is again identified, best seen on image 51/6. No new areas of infarct or hemorrhage. Lateral ventricles and midline structures are unremarkable. No acute extra-axial fluid collections. No mass effect. Vascular: Residual intravascular contrast from prior CT angiography. Skull: Normal. Negative for fracture or focal lesion. Sinuses/Orbits: No acute finding. Other: None. IMPRESSION: 1. Stable small focal subcortical hypodensity left frontal lobe, which could reflect an acute infarct. 2. No new areas of infarct or hemorrhage. Electronically Signed   By: Sharlet Salina M.D.   On: 08/01/2023 15:35   CT HEAD CODE STROKE WO CONTRAST Result Date: 08/01/2023 CLINICAL DATA:  Code stroke.  Aphasia EXAM: CT ANGIOGRAPHY HEAD AND NECK TECHNIQUE: Multidetector CT imaging of the head and neck was performed using the standard protocol during bolus administration of intravenous contrast. Multiplanar CT image reconstructions and MIPs were obtained to evaluate the vascular anatomy. Carotid stenosis measurements (when applicable) are obtained utilizing NASCET criteria, using the distal internal carotid diameter as the denominator. RADIATION DOSE REDUCTION: This exam was performed according to the departmental dose-optimization program which includes automated exposure control, adjustment of the mA  and/or kV according to patient size and/or use of iterative reconstruction technique. CONTRAST:  75mL OMNIPAQUE IOHEXOL 350 MG/ML SOLN COMPARISON:  CT head 04/25/2023, CTA head/neck 04/21/2023 FINDINGS: CT HEAD FINDINGS Brain: There is a small region of hypodensity with loss of gray-white differentiation in the left frontal lobe not seen on the prior study  suspicious for small acute infarct. There is no other evidence of acute infarct. There is no acute intracranial hemorrhage. Background parenchymal volume is normal. The ventricles are normal in size. Gray-white differentiation is otherwise preserved. Small remote infarcts in both cerebellar hemispheres are again noted. The pituitary and suprasellar region are normal. There is no mass lesion. There is no mass effect or midline shift. Vascular: See below. Skull: Normal. Negative for fracture or focal lesion. Sinuses/Orbits: The paranasal sinuses are clear. Bilateral lens implants are in place. The globes and orbits are otherwise unremarkable. Other: The mastoid air cells and middle ear cavities are clear. ASPECTS [facility] Stroke Program Early CT Score) - Ganglionic level infarction (caudate, lentiform nuclei, internal capsule, insula, M1-M3 cortex): 7 - Supraganglionic infarction (M4-M6 cortex): 2 Total score (0-10 with 10 being normal): 9 CTA NECK FINDINGS Aortic arch: The imaged aortic arch is normal. The origins of the major branch vessels are patent. The subclavian arteries are patent to the level imaged. Right carotid system: The right common, internal, and external carotid arteries are patent, with mild plaque at the bifurcation but no hemodynamically significant stenosis or occlusion. There is no evidence of dissection or aneurysm. Left carotid system: The left common, internal, and external carotid arteries are patent, with mixed plaque at the bifurcation but no hemodynamically significant stenosis or occlusion. There is no evidence of dissection or aneurysm. Vertebral arteries: The vertebral arteries are patent, without hemodynamically significant stenosis or occlusion. There is no evidence of dissection or aneurysm. Skeleton: There is no acute osseous abnormality or suspicious osseous lesion. Multilevel degenerative change is again seen in the cervical spine with a least moderate spinal canal stenosis at  C5-C6. Other neck: The soft tissues of the neck are unremarkable. Upper chest: The imaged lung apices are clear. Review of the MIP images confirms the above findings CTA HEAD FINDINGS Anterior circulation: The intracranial ICAs are patent. The bilateral MCAs are patent, without proximal stenosis or occlusion. The bilateral ACAS are patent, without proximal stenosis or occlusion. The anterior communicating artery is normal. There is no aneurysm or AVM. Posterior circulation: The bilateral V4 segments are patent. The basilar artery is patent. The major cerebellar arteries appear patent. The bilateral PCAs are patent, without proximal stenosis or occlusion. There is a fetal origin on the right. There is no aneurysm or AVM. Venous sinuses: Patent. Anatomic variants: As above. Review of the MIP images confirms the above findings IMPRESSION: 1. Suspect small acute infarct in the left frontal lobe. ASPECTS is 9. 2. Patent vasculature of the head and neck with mild plaque at the carotid bifurcations but no hemodynamically significant stenosis, occlusion, or dissection. Findings of the noncontrast head CT communicated to Dr Derry Lory at 1:22 pm. CTA results communicated at 1:30 pm. Electronically Signed   By: Lesia Hausen M.D.   On: 08/01/2023 13:34    12-lead ECG SR All prior EKG's in EPIC reviewed with no documented atrial fibrillation  Telemetry SR, occ PACs, infrequent PVCs, rare couplet, no AFib  Assessment and Plan:  1. Cryptogenic stroke The patient presents with cryptogenic stroke.  Dr. Nelly Laurence discussed rational for loop in environment of recurrent stroke, his PMHx  of very remote AFib over 10 years ago thought to be 2/2 his myxoma, with the patient, his wife/family bedside, they are all  very clear in their decision to proceed with implantable loop recorder.   Please call with questions.   Traeton Bordas Norberto Sorenson, PA-C 08/05/2023

## 2023-08-06 ENCOUNTER — Telehealth: Payer: Self-pay

## 2023-08-06 NOTE — Telephone Encounter (Signed)
-----   Message from [name] sent at 08/05/2023  3:25 PM EDT ----- Loop implant today MDT AM Cryptogenic stroke  [place]

## 2023-08-06 NOTE — Telephone Encounter (Signed)
Need to complete ILR implant f/u.   [facility] is connected as of 08/06/23. Remote transmissions are also scheduled.

## 2023-08-07 ENCOUNTER — Telehealth: Payer: Self-pay

## 2023-08-07 NOTE — Transitions of Care (Post Inpatient/ED Visit) (Signed)
08/07/2023  Name: Ethan Payne MRN: [id number] DOB: 11-Aug-1946  Today's TOC FU Call Status: Today's TOC FU Call Status:: Successful TOC FU Call Completed TOC FU Call Complete Date: 08/07/23 Patient's Name and Date of Birth confirmed.  Transition Care Management Follow-up Telephone Call Date of Discharge: 08/05/23 Discharge Facility: [facility]) Type of Discharge: Inpatient Admission Primary Inpatient Discharge Diagnosis:: "aphasia" How have you been since you were released from the hospital?: Better (pt voices things going well-denies any acute sxs-he is talking,eating and walking normally. He has had BM since coming home. Implant incision area healing-no s/s infection.) Any questions or concerns?: No  Items Reviewed: Did you receive and understand the discharge instructions provided?: Yes Medications obtained,verified, and reconciled?: Yes (Medications Reviewed) (confirmed with pt he understands per d/c instructions to take both ASA and Plavix for 21 days(3wks) then to stop taking ASA and take Plavix only, pt going today to pick up Zetia from pharmacy) Any new allergies since your discharge?: No Dietary orders reviewed?: Yes Type of Diet Ordered:: low salt/heart healthy Do you have support at home?: Yes People in Home: spouse Name of Support/Comfort Primary Source: Burna Mortimer  Medications Reviewed Today: Medications Reviewed Today     Reviewed by Charlyn Minerva, RN (Registered Nurse) on 08/07/23 at 1103  Med List Status: <None>   Medication Order Taking? Sig Documenting Provider Last Dose Status Informant  amLODipine (NORVASC) 5 MG tablet [id number] Yes Take 1 tablet (5 mg total) by mouth daily. Shelva Majestic, MD Taking Active Self, Spouse/Significant Other, Pharmacy Records  aspirin EC 81 MG tablet [id number] Yes Take 1 tablet (81 mg total) by mouth daily. Swallow whole. Lynnae January, NP Taking Active   calcium carbonate (TUMS - DOSED IN MG ELEMENTAL CALCIUM)  500 MG chewable tablet [id number] Yes Chew 1,000 mg by mouth in the morning and at bedtime. [provider] Taking Active Self, Spouse/Significant Other, Pharmacy Records  clopidogrel (PLAVIX) 75 MG tablet [id number] Yes Take 1 tablet (75 mg total) by mouth daily. Lynnae January, NP Taking Active   ezetimibe (ZETIA) 10 MG tablet [id number]  Take 1 tablet (10 mg total) by mouth daily. Lynnae January, NP  Active   lisinopril (ZESTRIL) 20 MG tablet [id number] Yes Take 1 tablet (20 mg total) by mouth daily.  Patient taking differently: Take 10 mg by mouth daily.   Shelva Majestic, MD Taking Active Self, Spouse/Significant Other, Pharmacy Records           Med Note (CRUTHIS, CHLOE C   Fri Aug 01, 2023  2:05 PM) Pt and SO are adamant the Pt is taking half of a 20 mg tablet daily.   rosuvastatin (CRESTOR) 20 MG tablet [id number] Yes Take 1 tablet (20 mg total) by mouth daily. Shelva Majestic, MD Taking Active Self, Spouse/Significant Other, Pharmacy Records            Home Care and Equipment/Supplies: Were Home Health Services Ordered?: NA Any new equipment or medical supplies ordered?: NA  Functional Questionnaire: Do you need assistance with bathing/showering or dressing?: No Do you need assistance with meal preparation?: No Do you need assistance with eating?: No Do you have difficulty maintaining continence: No Do you need assistance with getting out of bed/getting out of a chair/moving?: No Do you have difficulty managing or taking your medications?: No  Follow up appointments reviewed: PCP Follow-up appointment confirmed?: Yes Date of PCP follow-up appointment?: 08/12/23 Follow-up Provider: [facility] Follow-up appointment confirmed?:  No Reason Specialist Follow-Up Not Confirmed: Patient has Specialist Provider Number and will Call for Appointment (pt aware to call and make neuro appt-has contact info and will contact office) Do you need transportation to  your follow-up appointment?: No Do you understand care options if your condition(s) worsen?: Yes-patient verbalized understanding  SDOH Interventions Today    Flowsheet Row Most Recent Value  SDOH Interventions   Food Insecurity Interventions Intervention Not Indicated       TOC Interventions Today    Flowsheet Row Most Recent Value  TOC Interventions   TOC Interventions Discussed/Reviewed TOC Interventions Discussed, Arranged PCP follow up within 7 days/Care Guide scheduled      Interventions Today    Flowsheet Row Most Recent Value  Chronic Disease   Chronic disease during today's visit Other  [stroke]  General Interventions   General Interventions Discussed/Reviewed General Interventions Discussed, Doctor Visits, Durable Medical Equipment (DME)  Doctor Visits Discussed/Reviewed Doctor Visits Discussed, PCP, Specialist  Durable Medical Equipment (DME) BP Cuff  [pt has BP machine in the home-has not checked sicne returning home-encouraged to check BP daily,record and take readings to MD appt]  PCP/Specialist Visits Compliance with follow-up visit  Education Interventions   Education Provided Provided Education  Provided Verbal Education On Nutrition, When to see the doctor, Medication  Nutrition Interventions   Nutrition Discussed/Reviewed Nutrition Discussed, Adding fruits and vegetables, Increasing proteins, Decreasing fats, Decreasing salt, Fluid intake  Pharmacy Interventions   Pharmacy Dicussed/Reviewed Pharmacy Topics Discussed, Medications and their functions  Safety Interventions   Safety Discussed/Reviewed Safety Discussed, Home Safety        Alessandra Grout [facility] Care Management Care Management Community Coordinator Direct Phone: [phone] Toll Free: [phone] Fax: [phone]

## 2023-08-08 NOTE — Telephone Encounter (Signed)
  Loop Recorder Follow up   Is patient connected to [facility]? Yes   Have steri-strips fallen off or been removed? Yes   Does the patient need in office follow up? No   Please continue to monitor your cardiac device site for redness, swelling, and drainage. Call the device clinic at [phone] if you experience these symptoms, fever/chills, or have questions about your device.   Remote monitoring is used to monitor your cardiac device from home. This monitoring is scheduled every month by our office. It allows us to keep an eye on the functioning of your device to ensure it is working properly.  

## 2023-08-12 ENCOUNTER — Ambulatory Visit: Payer: Medicare Other | Admitting: Family Medicine

## 2023-08-12 ENCOUNTER — Encounter: Payer: Self-pay | Admitting: Family Medicine

## 2023-08-12 VITALS — BP 146/86 | HR 90 | Temp 98.2°F | Ht 66.0 in | Wt 168.8 lb

## 2023-08-12 DIAGNOSIS — D151 Benign neoplasm of heart: Secondary | ICD-10-CM

## 2023-08-12 DIAGNOSIS — Z8673 Personal history of transient ischemic attack (TIA), and cerebral infarction without residual deficits: Secondary | ICD-10-CM

## 2023-08-12 DIAGNOSIS — I10 Essential (primary) hypertension: Secondary | ICD-10-CM

## 2023-08-12 DIAGNOSIS — R7303 Prediabetes: Secondary | ICD-10-CM

## 2023-08-12 DIAGNOSIS — E785 Hyperlipidemia, unspecified: Secondary | ICD-10-CM

## 2023-08-12 DIAGNOSIS — Z8679 Personal history of other diseases of the circulatory system: Secondary | ICD-10-CM

## 2023-08-12 NOTE — Assessment & Plan Note (Signed)
No lingering effects from this but patient prefers to follow-up with Dr. Pollyann Kennedy seen in 2022

## 2023-08-12 NOTE — Patient Instructions (Addendum)
Call Dr. Ladona Ridgel for follow up- I also put in referral for you  I would reach out to device clinic for their thoughts on discomfort and I will also let cardiology/neurology know for their opinion.   As of now Plavix and aspirin together for 3 weeks and then Plavix alone but I will confirm with Dr. Roda Shutters   BP is above goal- want at least <140/90. Go back to full tablet of lisinopril 20 mg and conitinue amlodipine 2.5 mg- update me in 2 weeks with your pressures  Recommended follow up: Return for next already scheduled visit or sooner if needed.

## 2023-08-12 NOTE — Assessment & Plan Note (Signed)
Since patient has been intolerant of higher doses of rosuvastatin then 20 mg-Zetia 10 mg was also added-too soon for repeat but consider at follow-up visit if not done by neurology.  We are targeting LDL below 70 Lab Results  Component Value Date   CHOL 162 08/02/2023   HDL 42 08/02/2023   LDLCALC 101 (H) 08/02/2023   LDLDIRECT 87.0 06/16/2023   TRIG 94 08/02/2023   CHOLHDL 3.9 08/02/2023

## 2023-08-12 NOTE — Assessment & Plan Note (Signed)
BP is above goal- want at least <140/90. Go back to full tablet of lisinopril 20 mg and conitinue amlodipine 2.5 mg- update me in 2 weeks with your pressures.  Also considered increasing amlodipine but reported dizziness on 10 mg

## 2023-08-12 NOTE — Progress Notes (Signed)
Phone [phone]   Subjective:  Ethan Payne is a 77 y.o. year old very pleasant male patient who presents for transitional care management and hospital follow up for stroke likely cardioembolic that led to dysarthria. Patient was hospitalized from 08/01/2023 to 08/05/2023. A TCM phone call was completed on 08/07/2023. Medical complexity moderate  Patient presented to the hospital with confusion, task specific apraxia and aphasia and headache(s) and was found to have stroke status post TNK (previously with stroke in May requiring TNK) with improvement in symptoms.  MRI positive for small acute infarcts in the left frontal and parietal lobes.  Pattern was concerning for cardioembolic origin so loop recorder was placed-she also has remote history of atrial fibrillation at time of atrial myxoma removal in the past  Patient had code stroke called.  CT of the head and neck with no acute bleeds or significant stenosis.  MRI as above.  Echocardiogram with EF 60 to 65%, mild LVH, left atrium mildly to mildly dilated, no obvious shunting, LDL at 101, A1c at 5.9. -he was on aspirin at time of admission-so also placed on clopidogrel for 3 months with plan to transition to clopidogrel alone at 3 months- as of discharge summary but other notes suggest only 3 weeks.  - hhe was advised to have outpatient physical therapy and referred but he reports no need for this today ans reports full resolution of symptoms of change in speech  -She was also having intermittent visual changes lasting about 5 minutes that resolved spontaneously and they recommended outpatient ophthalmology follow-up- he plans to call for visit   Chronic conditions were managed as follows - Hypertension was maintained with lisinopril 10 mg (half of his 20 mg) and amlodipine 2.5 mg. Dizzy on 5 mg amlodipine - Hyperlipidemia was started on Zetia 10 mg in addition to his rosuvastatin 20 mg due to myalgia issues  Today patient reports that his  primary concern is that he would like to have the loop recorder removed as soon as possible.  He gets discomfort where loop recorder was placed if he turns onto either side- flat on back ok- if lays on left side can wake him up from sleep. He is not sure on loop recorder schedule-but looks to be reported monthly.. No swelling or redness -He has seen Dr. Ladona Ridgel in the past for history of atrial fibrillation after myxoma removal -His primary neurologist is Dr. Teresa Coombs  Of note I personally reviewed head CT from 08/01/2023-no evidence of skull fracture or trauma.  No obvious infarct.  There is a small left frontal subcortical hypodensity.  No obvious mass or mass effect..  Sinuses appear normal.   See problem oriented charting as well  Past Medical History-  Patient Active Problem List   Diagnosis Date Noted   Paroxysmal atrial fibrillation (HCC) 12/21/2021    Priority: High   CAD (coronary artery disease) 02/17/2015    Priority: High   s/p excision of Atrial myxoma 02/04/2008    Priority: High   BPH associated with nocturia 04/10/2015    Priority: Medium    Hyperlipidemia 02/04/2008    Priority: Medium    Essential hypertension 02/04/2008    Priority: Medium    Leg length difference, acquired 01/07/2018    Priority: Low   Rotator cuff syndrome of right shoulder 12/08/2017    Priority: Low   Health care maintenance 02/18/2015    Priority: Low   Erectile dysfunction 02/18/2015    Priority: Low   Heel pain 03/22/2011  Priority: Low   GERD (gastroesophageal reflux disease) 03/22/2011    Priority: Low   Osteoarthritis of right knee 04/18/2010    Priority: Low   Acute embolic stroke (HCC) 04/24/2023   Acute ischemic stroke (HCC) 04/21/2023    Medications- reviewed and updated  A medical reconciliation was performed comparing current medicines to hospital discharge medications. Current Outpatient Medications  Medication Sig Dispense Refill   aspirin EC 81 MG tablet Take 1 tablet  (81 mg total) by mouth daily. Swallow whole. 21 tablet 0   calcium carbonate (TUMS - DOSED IN MG ELEMENTAL CALCIUM) 500 MG chewable tablet Chew 1,000 mg by mouth in the morning and at bedtime.     clopidogrel (PLAVIX) 75 MG tablet Take 1 tablet (75 mg total) by mouth daily. 60 tablet 0   ezetimibe (ZETIA) 10 MG tablet Take 1 tablet (10 mg total) by mouth daily. 60 tablet 0   lisinopril (ZESTRIL) 20 MG tablet Take 1 tablet (20 mg total) by mouth daily. (Patient taking differently: Take 10 mg by mouth daily.) 90 tablet 3   rosuvastatin (CRESTOR) 20 MG tablet Take 1 tablet (20 mg total) by mouth daily. 90 tablet 3   amLODipine (NORVASC) 5 MG tablet Take 1 tablet (5 mg total) by mouth daily. (Patient not taking: Reported on 08/12/2023) 90 tablet 3   No current facility-administered medications for this visit.   Objective  Objective:  BP (!) 146/86   Pulse 90   Temp 98.2 F (36.8 C)   Ht 5\' 6"  (1.676 m)   Wt 168 lb 12.8 oz (76.6 kg)   SpO2 99%   BMI 27.25 kg/m  Gen: NAD, resting comfortably CV: RRR(heart rate in 80's on my exam) no murmurs rubs or gallops Lungs: CTAB no crackles, wheeze, rhonchi Abdomen: soft/nontender/nondistended/normal bowel sounds. No rebound or guarding.  Ext: no edema Skin: warm, dry, over left chest some bruising where loop recorder placed. Mildly tender in this area without significant redness- area of redness on picture sis from removed bandaid Neuro: no change in speech    Assessment and Plan:   # Transitional care management Assessment & Plan History of ischemic stroke -Patient reports full resolution of dysarthria and no lingering effects from stroke-grateful for TNK administration and benefit -There is very high suspicion that this could be embolic in patient with loop recorder in place to determine this.  Multiple small acute infarcts in left frontal and parietal lobes seems to fit pattern of embolic phenomenon -Patient with significant discomfort at site  of loop recorder and with very high desire to have this removed as soon as possible-I am going to message neurologist from hospital (to clarify that this is in fact only 3 weeks of Plavix and aspirin together versus the 3 months written on discharge summary) as well as Dr. Ladona Ridgel and Dr. Teresa Coombs to make them aware of patient's desire to remove as soon as possible).  Overall site with some bruising but appears to be healing.  Coordination of care required in this case -For discomfort and other questions about his loop recorder I gave him the number for the device clinic-she was very appreciative as he was having hard time figuring out who to contact -Risk factor modification has been advanced but also directly see hypertension and hyperlipidemia section -Patient is going to remain on Plavix and aspirin together for 3 weeks and then continue Plavix alone-unless we get different feedback from neurology after clarification messaging History of atrial fibrillation -Referral back to Dr.  Ladona Ridgel per patient's preference to discuss history of atrial fibrillation (with none reported since 2010) in context of stroke though there has been no recent proven atrial fibrillation yet s/p excision of Atrial myxoma No lingering effects from this but patient prefers to follow-up with Dr. Pollyann Kennedy seen in 2022 Essential hypertension BP is above goal- want at least <140/90. Go back to full tablet of lisinopril 20 mg and conitinue amlodipine 2.5 mg- update me in 2 weeks with your pressures.  Also considered increasing amlodipine but reported dizziness on 10 mg Hyperlipidemia, unspecified hyperlipidemia type Since patient has been intolerant of higher doses of rosuvastatin then 20 mg-Zetia 10 mg was also added-too soon for repeat but consider at follow-up visit if not done by neurology.  We are targeting LDL below 70 Lab Results  Component Value Date   CHOL 162 08/02/2023   HDL 42 08/02/2023   LDLCALC 101 (H) 08/02/2023    LDLDIRECT 87.0 06/16/2023   TRIG 94 08/02/2023   CHOLHDL 3.9 08/02/2023    Prediabetes   Lab Results  Component Value Date   HGBA1C 5.9 (H) 08/02/2023   HGBA1C 6.1 (H) 04/21/2023   HGBA1C  10/20/2009    5.7 (NOTE) The ADA recommends the following therapeutic goal for glycemic control related to Hgb A1c measurement: Goal of therapy: <6.5 Hgb A1c  Reference: American Diabetes Association: Clinical Practice Recommendations 2010, Diabetes Care, 2010, 33: (Suppl  1).  A1c was checked in the hospital and improved from her recent check here-continue efforts for healthy eating and regular exercise  Recommended follow up: Return for next already scheduled visit or sooner if needed. Future Appointments  Date Time Provider Department Center  09/08/2023  7:20 AM [facility] DEVICE REMOTES [facility]  09/11/2023 11:15 AM Kirsteins, Victorino Sparrow, MD [facility]  10/08/2023  8:45 AM Windell Norfolk, MD [facility] None  10/13/2023  7:20 AM [facility] DEVICE REMOTES [facility]  11/17/2023  7:20 AM [facility] DEVICE REMOTES [facility]  12/22/2023  7:20 AM [facility] DEVICE REMOTES [facility]  12/29/2023 10:00 AM Shelva Majestic, MD [facility]  01/26/2024  7:20 AM [facility] DEVICE REMOTES [facility]  03/01/2024  7:20 AM [facility] DEVICE REMOTES [facility]  03/29/2024  1:00 PM [facility] ANNUAL WELLNESS VISIT 1 [facility]  04/05/2024  7:20 AM [facility] DEVICE REMOTES [facility]  05/10/2024  7:20 AM [facility] DEVICE REMOTES [facility]  06/14/2024  7:20 AM [facility] DEVICE REMOTES [facility]  07/19/2024  7:20 AM [facility] DEVICE REMOTES [facility]    Lab/Order associations:   ICD-10-CM   1. History of ischemic stroke  Z86.73     2. History of atrial fibrillation  Z86.79 Ambulatory referral to Cardiology    3. s/p excision of Atrial myxoma  D15.1 Ambulatory  referral to Cardiology    4. Essential hypertension  I10     5. Hyperlipidemia, unspecified hyperlipidemia type  E78.5     6. Prediabetes  R73.03      Please note this would be a [id number] if TCM apartments not satisfied  Time Spent: 45 minutes of total time (10:52 AM-11:32 AM, 7:49 PM-7:54 PM) was spent on the date of the encounter performing the following actions: chart review prior to seeing the patient including summarizing discharge summary and reaching out to physician who wrote this for clarification, obtaining history directly from patient, performing a medically necessary exam, counseling on the treatment plan as well as patient questions about loop recorder, placing orders including referrals and coordinating these with patient,  and documenting in our EHR.    Return precautions advised.  Tana Conch, MD

## 2023-09-08 ENCOUNTER — Ambulatory Visit: Payer: Medicare Other

## 2023-09-08 ENCOUNTER — Telehealth: Payer: Self-pay | Admitting: Physical Medicine & Rehabilitation

## 2023-09-08 DIAGNOSIS — I639 Cerebral infarction, unspecified: Secondary | ICD-10-CM | POA: Diagnosis not present

## 2023-09-08 DIAGNOSIS — H353210 Exudative age-related macular degeneration, right eye, stage unspecified: Secondary | ICD-10-CM | POA: Diagnosis not present

## 2023-09-08 LAB — CUP PACEART REMOTE DEVICE CHECK
Date Time Interrogation Session: 20241007112706
Implantable Pulse Generator Implant Date: 20240903

## 2023-09-08 NOTE — Telephone Encounter (Signed)
The patient called to reschedule his appt, he said that the injection should be for his right knee not for the right shoulder. I told him I would have to check with Dr Wynn Banker.

## 2023-09-11 ENCOUNTER — Encounter: Payer: Medicare Other | Admitting: Physical Medicine & Rehabilitation

## 2023-09-11 NOTE — Telephone Encounter (Signed)
Called pt to advise right knee injection

## 2023-09-23 ENCOUNTER — Encounter: Payer: Self-pay | Admitting: Internal Medicine

## 2023-09-23 ENCOUNTER — Ambulatory Visit: Payer: Medicare Other | Attending: Internal Medicine | Admitting: Internal Medicine

## 2023-09-23 VITALS — BP 132/76 | HR 108 | Ht 69.0 in | Wt 170.6 lb

## 2023-09-23 DIAGNOSIS — Z8673 Personal history of transient ischemic attack (TIA), and cerebral infarction without residual deficits: Secondary | ICD-10-CM

## 2023-09-23 DIAGNOSIS — I639 Cerebral infarction, unspecified: Secondary | ICD-10-CM

## 2023-09-23 MED ORDER — APIXABAN 5 MG PO TABS: 5.0000 mg | ORAL_TABLET | Freq: Two times a day (BID) | ORAL | 3 refills | Status: DC

## 2023-09-23 NOTE — Progress Notes (Signed)
HPI Ethan Payne returns today for followup. He is a pleasant 77 yo man with a h/o stroke, and a remote h/o atrial myxoma and atrial fib s/p removal and [facility] MAZE. The patient has undergone ILR insertion 2 months ago but has had pain and would like to have the device removed.  Allergies  Allergen Reactions   Hydrocodone Palpitations    After rib fractures. Hydrocodone-States had shortness of breath, 911 was called    Loratadine Anxiety and Other (See Comments)    Heart speeds up      Current Outpatient Medications  Medication Sig Dispense Refill   amLODipine (NORVASC) 5 MG tablet Take 1 tablet (5 mg total) by mouth daily. 90 tablet 3   apixaban (ELIQUIS) 5 MG TABS tablet Take 1 tablet (5 mg total) by mouth 2 (two) times daily. 180 tablet 3   calcium carbonate (TUMS - DOSED IN MG ELEMENTAL CALCIUM) 500 MG chewable tablet Chew 1,000 mg by mouth in the morning and at bedtime.     lisinopril (ZESTRIL) 20 MG tablet Take 1 tablet (20 mg total) by mouth daily. (Patient taking differently: Take 10 mg by mouth daily.) 90 tablet 3   rosuvastatin (CRESTOR) 20 MG tablet Take 1 tablet (20 mg total) by mouth daily. 90 tablet 3   ezetimibe (ZETIA) 10 MG tablet Take 1 tablet (10 mg total) by mouth daily. (Patient not taking: Reported on 09/23/2023) 60 tablet 0   No current facility-administered medications for this visit.     Past Medical History:  Diagnosis Date   Acute prostatitis 02/04/2008   ATRIAL MYXOMA 02/04/2008   Closed fracture of four ribs 11/01/2009   HYPERLIPIDEMIA 02/04/2008   HYPERTENSION 02/04/2008   Osteoarthritis of right knee 04/18/2010    ROS:   All systems reviewed and negative except as noted in the HPI.   Past Surgical History:  Procedure Laterality Date   CATARACT EXTRACTION Left 08/03/2019   CORONARY ARTERY BYPASS GRAFT  2007   x2 Saph vein   COX-MAZE MICROWAVE ABLATION  2007   EXCISION OF ATRIAL MYXOMA  2007   Dr C. Owen   LOOP RECORDER INSERTION N/A 08/05/2023    Procedure: LOOP RECORDER INSERTION;  Surgeon: Maurice Small, MD;  Location: [facility];  Service: Cardiovascular;  Laterality: N/A;     Family History  Problem Relation Age of Onset   Stroke Mother    Heart disease Father        MI 65   Hypertension Father    Ovarian cancer Sister      Social History   Socioeconomic History   Marital status: Married    Spouse name: Not on file   Number of children: Not on file   Years of education: Not on file   Highest education level: Not on file  Occupational History   Occupation: married  Tobacco Use   Smoking status: Never   Smokeless tobacco: Never  Substance and Sexual Activity   Alcohol use: No   Drug use: No   Sexual activity: Yes  Other Topics Concern   Not on file  Social History Narrative   Married will be 50 years in 2019 (wife seen at another practice). 2 children-daughter unmarried, professional student 61 and son 54 in 2016. 2 grandsons-17 and 84 in 2019.       Still works as Product/process development scientist, does his own carpentry work      Hobbies: fishing   Social Determinants of  Health   Financial Resource Strain: Low Risk  (03/24/2023)   Overall Financial Resource Strain (CARDIA)    Difficulty of Paying Living Expenses: Not hard at all  Food Insecurity: No Food Insecurity (08/07/2023)   Hunger Vital Sign    Worried About Running Out of Food in the Last Year: Never true    Ran Out of Food in the Last Year: Never true  Transportation Needs: No Transportation Needs (04/23/2023)   PRAPARE - Administrator, Civil Service (Medical): No    Lack of Transportation (Non-Medical): No  Physical Activity: Insufficiently Active (03/24/2023)   Exercise Vital Sign    Days of Exercise per Week: 3 days    Minutes of Exercise per Session: 30 min  Stress: No Stress Concern Present (03/24/2023)   [employer] of Occupational Health - Occupational Stress Questionnaire    Feeling of Stress : Not at all  Social  Connections: Moderately Isolated (03/24/2023)   Social Connection and Isolation Panel [NHANES]    Frequency of Communication with Friends and Family: More than three times a week    Frequency of Social Gatherings with Friends and Family: More than three times a week    Attends Religious Services: Never    Database administrator or Organizations: No    Attends Banker Meetings: Never    Marital Status: Married  Catering manager Violence: Not At Risk (04/23/2023)   Humiliation, Afraid, Rape, and Kick questionnaire    Fear of Current or Ex-Partner: No    Emotionally Abused: No    Physically Abused: No    Sexually Abused: No     BP 132/76   Pulse (!) 108   Ht 5\' 9"  (1.753 m)   Wt 170 lb 9.6 oz (77.4 kg)   SpO2 97%   BMI 25.19 kg/m   Physical Exam:  Well appearing NAD HEENT: Unremarkable Neck:  No JVD, no thyromegally Lymphatics:  No adenopathy Back:  No CVA tenderness Lungs:  Clear with no wheezes HEART:  Regular rate rhythm, no murmurs, no rubs, no clicks Abd:  soft, positive bowel sounds, no organomegally, no rebound, no guarding Ext:  2 plus pulses, no edema, no cyanosis, no clubbing Skin:  No rashes no nodules Neuro:  CN II through XII intact, motor grossly intact  EKG - nsr  DEVICE  Normal device function.  See PaceArt for details. No afib.  A/P Cryptogenic stroke - with his remote history and strokes, it is highly likely that he has had atrial fib. He will start eliquis and stop plavix. ILR - he would like this be removed. We will follow.  EP Procedure note  Procedure Performed: ILR removal  Preop diagnosis: cryptogenic stroke.   Postop diagnosis: same as preop  Description of the procedure: after informed consent was obtained the patient was prepped and draped in a sterile fashion. 5 cc of lidocaine was infiltrated. A combination of blunt and sharp dissection was carried out and the iLR was grasped and removed with gentle traction. Benzoin and  steristrips were painted on the skin and a bandage applied.    Sharlot Gowda Maleta Pacha,MD

## 2023-09-23 NOTE — Patient Instructions (Addendum)
Medication Instructions:  Your physician has recommended you make the following change in your medication:  Start eliquis 5 mg twice daily.   Lab Work: None ordered.  If you have labs (blood work) drawn today and your tests are completely normal, you will receive your results only by: MyChart Message (if you have MyChart) OR A paper copy in the mail If you have any lab test that is abnormal or we need to change your treatment, we will call you to review the results.  Testing/Procedures: None ordered.  Follow-Up: At [facility], you and your health needs are our priority.  As part of our continuing mission to provide you with exceptional heart care, we have created designated Provider Care Teams.  These Care Teams include your primary Cardiologist (physician) and Advanced Practice Providers (APPs -  Physician Assistants and Nurse Practitioners) who all work together to provide you with the care you need, when you need it.   Your next appointment:   4 months  The format for your next appointment:   In Person  Provider:   Lewayne Bunting, MD{or one of the following Advanced Practice Providers on your designated Care Team:   Francis Dowse, [place] Casimiro Needle "Mardelle Matte" Kim, [place] Earnest Rosier, NP    Important Information About Sugar

## 2023-09-24 NOTE — Progress Notes (Signed)
Carelink Summary Report / Loop Recorder 

## 2023-10-02 ENCOUNTER — Encounter (HOSPITAL_COMMUNITY): Payer: Self-pay | Admitting: Emergency Medicine

## 2023-10-02 ENCOUNTER — Other Ambulatory Visit: Payer: Self-pay

## 2023-10-02 ENCOUNTER — Emergency Department (HOSPITAL_COMMUNITY)
Admission: EM | Admit: 2023-10-02 | Discharge: 2023-10-02 | Disposition: A | Discharge status: Home / Self Care | Payer: Medicare Other

## 2023-10-02 DIAGNOSIS — I471 Supraventricular tachycardia, unspecified: Secondary | ICD-10-CM | POA: Diagnosis not present

## 2023-10-02 DIAGNOSIS — R000 Tachycardia, unspecified: Secondary | ICD-10-CM | POA: Diagnosis not present

## 2023-10-02 DIAGNOSIS — R002 Palpitations: Secondary | ICD-10-CM | POA: Diagnosis not present

## 2023-10-02 DIAGNOSIS — Z7901 Long term (current) use of anticoagulants: Secondary | ICD-10-CM | POA: Insufficient documentation

## 2023-10-02 DIAGNOSIS — I10 Essential (primary) hypertension: Secondary | ICD-10-CM | POA: Diagnosis not present

## 2023-10-02 LAB — BASIC METABOLIC PANEL
Anion gap: 7 (ref 5–15)
BUN: 15 mg/dL (ref 8–23)
CO2: 26 mmol/L (ref 22–32)
Calcium: 9 mg/dL (ref 8.9–10.3)
Chloride: 104 mmol/L (ref 98–111)
Creatinine, Ser: 1.13 mg/dL (ref 0.61–1.24)
GFR, Estimated: >60 mL/min (ref >60)
Glucose, Bld: 129 mg/dL — ABNORMAL HIGH (ref 70–99)
Potassium: 3.5 mmol/L (ref 3.5–5.1)
Sodium: 137 mmol/L (ref 135–145)

## 2023-10-02 LAB — CBC
HCT: 40.5 % (ref 39.0–52.0)
Hemoglobin: 13.6 g/dL (ref 13.0–17.0)
MCH: 30.7 pg (ref 26.0–34.0)
MCHC: 33.6 g/dL (ref 30.0–36.0)
MCV: 91.4 fL (ref 80.0–100.0)
Platelets: 218 10*3/uL (ref 150–400)
RBC: 4.43 MIL/uL (ref 4.22–5.81)
RDW: 12 % (ref 11.5–15.5)
WBC: 8.8 10*3/uL (ref 4.0–10.5)
nRBC: 0 % (ref 0.0–0.2)

## 2023-10-02 LAB — MAGNESIUM: Magnesium: 1.9 mg/dL (ref 1.7–2.4)

## 2023-10-02 NOTE — Discharge Instructions (Signed)
Please follow-up with your cardiologist and your primary doctor as soon as possible.  Return immediately if develop fevers, chills, headache, vision changes, chest pain, shortness of breath, feel that your heart is racing, you pass out or you develop any new or worsening symptoms that are concerning to you.

## 2023-10-02 NOTE — ED Notes (Signed)
PO fluids given per MD Young.

## 2023-10-02 NOTE — ED Provider Notes (Signed)
[place] EMERGENCY DEPARTMENT AT [facility] Provider Note   CSN: [id number] Arrival date & time: 10/02/23  1612     History  Chief Complaint  Patient presents with   Palpitations    OSCEOLA KOE is a 77 y.o. male.  77 year old male present emergency department by EMS for generalized weakness/lightheadedness.  He was shopping at [employer] had sudden onset of lightheadedness and weakness.  Thought his heart rate was fast.  EMS noted patient in SVT and gave adenosine prior to arrival converting to sinus tachycardia.  Patient states he feels like his normal self, no chest pain, no shortness of breath abdominal pain nausea vomiting or any preceding symptoms.   Palpitations      Home Medications Prior to Admission medications   Medication Sig Start Date End Date Taking? Authorizing Provider  amLODipine (NORVASC) 5 MG tablet Take 1 tablet (5 mg total) by mouth daily. 03/24/23   Shelva Majestic, MD  apixaban (ELIQUIS) 5 MG TABS tablet Take 1 tablet (5 mg total) by mouth 2 (two) times daily. 09/23/23   Marinus Maw, MD  calcium carbonate (TUMS - DOSED IN MG ELEMENTAL CALCIUM) 500 MG chewable tablet Chew 1,000 mg by mouth in the morning and at bedtime.    [provider]  ezetimibe (ZETIA) 10 MG tablet Take 1 tablet (10 mg total) by mouth daily. Patient not taking: Reported on 09/23/2023 08/06/23   Lynnae January, NP  lisinopril (ZESTRIL) 20 MG tablet Take 1 tablet (20 mg total) by mouth daily. Patient taking differently: Take 10 mg by mouth daily. 06/16/23   Shelva Majestic, MD  rosuvastatin (CRESTOR) 20 MG tablet Take 1 tablet (20 mg total) by mouth daily. 06/16/23   Shelva Majestic, MD      Allergies    Hydrocodone and Loratadine    Review of Systems   Review of Systems  Cardiovascular:  Positive for palpitations.    Physical Exam Updated Vital Signs BP (!) 143/56   Pulse 95   Temp 98.8 F (37.1 C) (Oral)   Resp 15   SpO2 100%  Physical  Exam Vitals and nursing note reviewed.  Constitutional:      General: He is not in acute distress.    Appearance: He is not toxic-appearing.  HENT:     Head: Normocephalic.     Mouth/Throat:     Mouth: Mucous membranes are moist.  Eyes:     Conjunctiva/sclera: Conjunctivae normal.  Cardiovascular:     Rate and Rhythm: Regular rhythm. Tachycardia present.  Pulmonary:     Effort: Pulmonary effort is normal.     Breath sounds: Normal breath sounds.  Abdominal:     General: Abdomen is flat. There is no distension.     Tenderness: There is no abdominal tenderness. There is no guarding or rebound.  Musculoskeletal:        General: Normal range of motion.     Right lower leg: No edema.     Left lower leg: No edema.  Skin:    General: Skin is warm and dry.     Capillary Refill: Capillary refill takes less than 2 seconds.  Neurological:     Mental Status: He is alert and oriented to person, place, and time.  Psychiatric:        Mood and Affect: Mood normal.        Behavior: Behavior normal.     ED Results / Procedures / Treatments   Labs (  all labs ordered are listed, but only abnormal results are displayed) Labs Reviewed  BASIC METABOLIC PANEL - Abnormal; Notable for the following components:      Result Value   Glucose, Bld 129 (*)    All other components within normal limits  CBC  MAGNESIUM    EKG EKG Interpretation Date/Time:  Thursday October 02 2023 16:27:26 EDT Ventricular Rate:  106 PR Interval:  176 QRS Duration:  103 QT Interval:  349 QTC Calculation: 464 R Axis:   27  Text Interpretation: Sinus tachycardia Probable anteroseptal infarct, old Confirmed by Estanislado Pandy [phone]) on 10/02/2023 5:11:32 PM  Radiology No results found.  Procedures Procedures    Medications Ordered in ED Medications - No data to display  ED Course/ Medical Decision Making/ A&P Clinical Course as of 10/02/23 2348  Thu Oct 02, 2023  1624 Per EMS report, patient had SVT  with heart rate in the 190s.  Abrupt onset with lightheadness/weakness.  Converted after 6 mg of adenosine.  Stable vitals en route. [TY]    Clinical Course User Index [TY] Coral Spikes, DO                                 Medical Decision Making So well-appearing 77 year old male presenting emergency department for SVT.  Heart rate reportedly in the 190s per EMS report.  Converted to sinus tachycardia with 6 mg of adenosine prior to arrival.  Patient has remained hemodynamically stable.  He is asymptomatic currently.  Does have a history of prior CVAs and has been recently put on Eliquis by his cardiologist after having a loop recorder in for some time.  Reported loop recorder had no arrhythmias noted.  Basic labs ordered and largely reassuring.  He has no fever, leukocytosis to suggest infectious etiology.  Not having any shortness of breath, lungs are clear.  No indication for chest x-ray at this time.  BMP with no metabolic derangements.  Magnesium normal.  Patient observed in the emergency department with no further episodes.  Heart rate improved with oral rehydration. Discussed follow-up with cardiologist, patient agreeable to plan.  Stable for discharge at this time.  Amount and/or Complexity of Data Reviewed External Data Reviewed: notes.    Details: Per last cardiology note earlier this month :"A/P 1. Cryptogenic stroke - with his remote history and strokes, it is highly likely that he has had atrial fib. He will start eliquis and stop plavix. 2. ILR - he would like this be removed. We will follow " Labs: ordered. ECG/medicine tests: ordered.  Risk Decision regarding hospitalization.          Final Clinical Impression(s) / ED Diagnoses Final diagnoses:  SVT (supraventricular tachycardia) [facility])    Rx / DC Orders ED Discharge Orders     None         Coral Spikes, DO 10/02/23 2348

## 2023-10-02 NOTE — ED Triage Notes (Signed)
Pt BIB [facility] with reports of palpitations. Pt was found to be in SVT with rate of 190. Pt was given 6mg  adenosine en route which converted the heart rate.

## 2023-10-03 ENCOUNTER — Telehealth: Payer: Self-pay | Admitting: Family Medicine

## 2023-10-03 NOTE — Telephone Encounter (Signed)
Patient states he was at ED 10/01/23 and was advised to see Dr. Durene Cal  as soon as he can. No availability until December. Please advise.

## 2023-10-06 NOTE — Telephone Encounter (Signed)
Can do this Wednesday at 11:40 PM but may have extended wait-I do not see any other availability

## 2023-10-06 NOTE — Telephone Encounter (Signed)
Is this ok or willing to work pt in?

## 2023-10-07 NOTE — Telephone Encounter (Signed)
LVM to schedule with Hunter on 10/08/23 at 11:40 am only.

## 2023-10-07 NOTE — Telephone Encounter (Signed)
See below

## 2023-10-08 ENCOUNTER — Inpatient Hospital Stay: Payer: Medicare Other | Admitting: Neurology

## 2023-10-08 ENCOUNTER — Encounter: Payer: Self-pay | Admitting: Family Medicine

## 2023-10-08 ENCOUNTER — Ambulatory Visit (INDEPENDENT_AMBULATORY_CARE_PROVIDER_SITE_OTHER): Payer: Medicare Other | Admitting: Family Medicine

## 2023-10-08 VITALS — BP 130/70 | HR 100 | Temp 98.3°F | Ht 69.0 in | Wt 168.6 lb

## 2023-10-08 DIAGNOSIS — I471 Supraventricular tachycardia, unspecified: Secondary | ICD-10-CM | POA: Diagnosis not present

## 2023-10-08 DIAGNOSIS — I10 Essential (primary) hypertension: Secondary | ICD-10-CM

## 2023-10-08 NOTE — Progress Notes (Signed)
Phone [phone] In person visit   Subjective:   Ethan Payne is a 77 y.o. year old very pleasant male patient who presents for/with See problem oriented charting Chief Complaint  Patient presents with   ER f/u    Pt is here for ER f/u due to SVT's, wants Clopidogrel refilled but not on current med list.   Past Medical History-  Patient Active Problem List   Diagnosis Date Noted   SVT (supraventricular tachycardia) (HCC) 10/08/2023    Priority: High   Paroxysmal atrial fibrillation (HCC) 12/21/2021    Priority: High   CAD (coronary artery disease) 02/17/2015    Priority: High   s/p excision of Atrial myxoma 02/04/2008    Priority: High   BPH associated with nocturia 04/10/2015    Priority: Medium    Hyperlipidemia 02/04/2008    Priority: Medium    Essential hypertension 02/04/2008    Priority: Medium    Leg length difference, acquired 01/07/2018    Priority: Low   Rotator cuff syndrome of right shoulder 12/08/2017    Priority: Low   Health care maintenance 02/18/2015    Priority: Low   Erectile dysfunction 02/18/2015    Priority: Low   Heel pain 03/22/2011    Priority: Low   GERD (gastroesophageal reflux disease) 03/22/2011    Priority: Low   Osteoarthritis of right knee 04/18/2010    Priority: Low    Medications- reviewed and updated Current Outpatient Medications  Medication Sig Dispense Refill   amLODipine (NORVASC) 5 MG tablet Take 1 tablet (5 mg total) by mouth daily. 90 tablet 3   apixaban (ELIQUIS) 5 MG TABS tablet Take 1 tablet (5 mg total) by mouth 2 (two) times daily. 180 tablet 3   calcium carbonate (TUMS - DOSED IN MG ELEMENTAL CALCIUM) 500 MG chewable tablet Chew 1,000 mg by mouth in the morning and at bedtime.     ezetimibe (ZETIA) 10 MG tablet Take 1 tablet (10 mg total) by mouth daily. (Patient not taking: Reported on 09/23/2023) 60 tablet 0   lisinopril (ZESTRIL) 20 MG tablet Take 1 tablet (20 mg total) by mouth daily. (Patient taking  differently: Take 10 mg by mouth daily.) 90 tablet 3   rosuvastatin (CRESTOR) 20 MG tablet Take 1 tablet (20 mg total) by mouth daily. 90 tablet 3   No current facility-administered medications for this visit.     Objective:  BP 130/70   Pulse 100   Temp 98.3 F (36.8 C)   Ht 5\' 9"  (1.753 m)   Wt 168 lb 9.6 oz (76.5 kg)   SpO2 98%   BMI 24.90 kg/m  Gen: NAD, resting comfortably CV: slightly tachycardic but regular no murmurs rubs or gallops Lungs: CTAB no crackles, wheeze, rhonchi Ext: no edema Skin: warm, dry     Assessment and Plan    #social update- day after IMPLANTABLE LOOP RECORDER removal on 09/23/23 wife had a heart attack on 09/24/23 -he has had to quit work to care for himself and his wife  # SVT S:Patient presented to the emergency department on 10/02/2023-she was shopping at [employer] and had sudden onset of lightheadedness and weakness.  He noted his heart rate to be fast.  EMS noted patient in SVT and gave him adenosine 6 mg prior to arrival to the emergency department and he converted to sinus tachycardia.  Patient felt much improved after this with no fatigue-also denied chest pain, shortness of breath or palpitations at that point. He tells me  his heart rate was up to 190 before treatment.   EKG confirmed sinus tachycardia, BMP reassuring, magnesium normal. - reports for years baseline heart rate between 100-110 but on our records tends to run high normal with occasional heart rates in the low 100s  He reports overall feeling well since being home without recurrence-occasionally if he is stressed he may feel some mild chest pressure but reports this is not new and has been ongoing for a long period of time  Since patient's SVT came 8 days after starting the Eliquis he is very concerned that the Eliquis caused SVT-a few days ago he switched back to clopidogrel A/P: 77 year old male with history of cryptogenic stroke in May 2024 with recurrence in August 2024  with initial loop recorder placement which was very bothersome to the patient and he had this removed while being placed on Eliquis instead of clopidogrel due to high probability of atrial fibrillation/cardioembolic stroke-Patient with SVT 8 days after being placed on Eliquis-he decided on his own to transition back to clopidogrel.  I encouraged him to switch back to Eliquis until he sees cardiology on the 22nd but he states he will likely decline this and remain on clopidogrel.  He also wants to see neurology for their opinion.  He is okay with me reaching out to Dr. Ladona Ridgel to see if he would also recommend switching back to Eliquis which I will do today -for SVT agree with cardiology follow up and we discussed valsalva as potentially being helpful with recurrence (but thankfully he has had none). He appeared to be in sinus tachycardia today as he was in ER -I reached out to Dr. Ladona Ridgel and the preferred recommendation was to remain on Eliquis but we certainly understand/respect the patient's perspective-I called patient back and once again recommended formal recommendation of Eliquis-he is decided at this time to remain on the clopidogrel until discussion with cardiology at visit on the 22nd and cardiology to tailor is also aware of this through secure messaging.  We have discussed the potential risk is increased risk of stroke if the underlying etiology is atrial fibrillation/cardioembolic stroke  #hypertension S: medication: Amlodipine 5 mg, lisinopril 20 mg BP Readings from Last 3 Encounters:  10/08/23 130/70  10/02/23 (!) 143/56  09/23/23 132/76  A/P: Well-controlled-continue current medication  Recommended follow up: Return for next already scheduled visit or sooner if needed. Future Appointments  Date Time Provider Department Center  10/09/2023 11:30 AM Kirsteins, Victorino Sparrow, MD [facility]  10/24/2023  8:00 AM Jeanella Craze, NP [facility]  12/29/2023 10:00 AM Shelva Majestic,  MD [facility]  01/19/2024  1:30 PM [name], PA-C [facility]  03/29/2024  1:00 PM [facility] ANNUAL WELLNESS VISIT 1 [facility]    Lab/Order associations:   ICD-10-CM   1. SVT (supraventricular tachycardia) (HCC)  I47.10     2. Essential hypertension  I10       Time Spent: 43 minutes of total time (12:27 PM-1:10 PM) was spent on the date of the encounter performing the following actions: chart review prior to seeing the patient, obtaining history, performing a medically necessary exam, counseling on the treatment plan, placing orders, reaching out to cardiology Dr. Ladona Ridgel and then calling patient back and documenting in our EHR.    Return precautions advised.  Tana Conch, MD

## 2023-10-08 NOTE — Patient Instructions (Addendum)
I recommend starting back on the eliquis tomorrow and stopping the clopidogrel. I will reach out to Dr. Ladona Ridgel and let you know if I hear any change in plan- otherwise keep visit on 22nd with them  If new or worsening symptoms please let us know as soon as possible or seek care  Recommended follow up: Return for next already scheduled visit or sooner if needed.

## 2023-10-09 ENCOUNTER — Encounter: Payer: Self-pay | Admitting: Physical Medicine & Rehabilitation

## 2023-10-09 ENCOUNTER — Encounter: Payer: Medicare Other | Attending: Physical Medicine & Rehabilitation | Admitting: Physical Medicine & Rehabilitation

## 2023-10-09 VITALS — BP 150/77 | HR 98 | Ht 69.0 in | Wt 168.0 lb

## 2023-10-09 DIAGNOSIS — M1711 Unilateral primary osteoarthritis, right knee: Secondary | ICD-10-CM | POA: Diagnosis not present

## 2023-10-09 MED ORDER — BETAMETHASONE SOD PHOS & ACET 6 (3-3) MG/ML IJ SUSP
6.0000 mg | Freq: Once | INTRAMUSCULAR | Status: DC
Start: 2023-10-09 — End: 2023-12-29

## 2023-10-09 MED ORDER — LIDOCAINE HCL 1 % IJ SOLN
5.0000 mL | Freq: Once | INTRAMUSCULAR | Status: DC
Start: 2023-10-09 — End: 2023-12-29

## 2023-10-09 NOTE — Patient Instructions (Signed)

## 2023-10-09 NOTE — Progress Notes (Signed)
Knee injection RIGHT without  ultrasound guidance)  Indication:Right Knee pain not relieved by medication management and other conservative care.  Informed consent was obtained after describing risks and benefits of the procedure with the patient, this includes bleeding, bruising, infection and medication side effects. The patient wishes to proceed and has given written consent. The patient was placed in a recumbent position. The medial aspect of the knee was marked and prepped with Betadine and alcohol. It was then entered with a 25-gauge 1-1/2 inch needle inserted into the knee joint. After negative draw back for blood, a solution containing one ML of 6mg  per mL betamethasone and 4 mL of 1% lidocaine were injected. The patient tolerated the procedure well. Post procedure instructions were given.

## 2023-10-23 NOTE — Progress Notes (Signed)
Electrophysiology Office Note:   Date:  10/24/2023  ID:  Ethan Payne, DOB 03-28-1946, MRN [id number]  Primary Cardiologist: Lewayne Bunting, MD Electrophysiologist: None      History of Present Illness:   Ethan Payne is a 77 y.o. male with h/o SVT, atrial myxoma & paroxysmal AF s/p removal & Cox-MAZE, HTN, HLD, CAD s/p CABG, cryptogenic stroke x2, GERD seen today for routine electrophysiology followup.   Admitted 07/2023 with acute headache, confusion, aphasia.  He was treated with TNK, MRI consistent with L MCA CVA. ILR was placed for AF monitoring.  He subsequently saw Dr. Ladona Ridgel in 09/2023 and had device removed due to pain.   Since last being seen in our clinic the patient reports he was seen in the ER 10/02/23 for SVT.  He associates his SVT with taking Eliquis. He stopped taking Eliquis on his own and thus far has not been willing to go back on it. He reports he was under a lot of stress during the SVT event as his wife was ill.   He denies chest pain, palpitations, dyspnea, PND, orthopnea, nausea, vomiting, dizziness, syncope, edema, weight gain, or early satiety.   Review of systems complete and found to be negative unless listed in HPI.   EP Information / Studies Reviewed:    EKG is not ordered today. EKG from 10/02/23 reviewed which showed ST 106 bpm      Studies:  ECHO 07/2023 > LVEF 60-65%, no RWMA, mild LVH, LA mild to moderately dilated  Arrhythmia / AAD Paroxysmal AF    Device MDT ILR inserted 08/05/23 for cryptogenic stroke > removed 09/23/23 at pt request (pain)   Risk Assessment/Calculations:    CHA2DS2-VASc Score = 6   This indicates a 9.7% annual risk of stroke. The patient's score is based upon: CHF History: 0 HTN History: 1 Diabetes History: 0 Stroke History: 2 Vascular Disease History: 1 Age Score: 2 Gender Score: 0    HYPERTENSION CONTROL Vitals:   10/24/23 0809 10/24/23 1300  BP: (!) 144/86 (!) 140/78    The patient's blood pressure is  elevated above target today.  In order to address the patient's elevated BP: Blood pressure will be monitored at home to determine if medication changes need to be made.           Physical Exam:   VS:  BP (!) 140/78   Pulse (!) 101   Ht 5\' 9"  (1.753 m)   Wt 170 lb 9.6 oz (77.4 kg)   SpO2 96%   BMI 25.19 kg/m    Wt Readings from Last 3 Encounters:  10/24/23 170 lb 9.6 oz (77.4 kg)  10/09/23 168 lb (76.2 kg)  10/08/23 168 lb 9.6 oz (76.5 kg)     GEN: Well nourished, well developed in no acute distress NECK: No JVD; No carotid bruits CARDIAC: Regular rate and rhythm, no murmurs, rubs, gallops RESPIRATORY:  Clear to auscultation without rales, wheezing or rhonchi  ABDOMEN: Soft, non-tender, non-distended EXTREMITIES:  No edema; No deformity   ASSESSMENT AND PLAN:    Paroxysmal Atrial Fibrillation  Hx Atrial Myxoma s/p Removal, Cox-Maze Procedure  Prior to 07/2023, he had not had any sx of AF in 10 years.  Sx thought related to atrial myxoma.   -asymptomatic / no further episodes since 10/31 (see media for EMS rhythm strips) -reviewed his CHA2DS2-VASc of 5 and his risk of annual stroke. Discussed in detail how Eliquis works and that it was not related to his episode  of SVT.  Also reviewed that it protects him from future stroke. Offered Xarelto as a substitution and he declines. Further offered coumadin. He again asks to take Plavix. Discussed that this drug DOES NOT protect him against further stroke. Dr. Ladona Ridgel reviewed with him as well in clinic and pt again only wants to take plavix.   Secondary Hypercoagulable State  -pt refuses anticoagulation as above   Cryptogenic Stroke  -ILR removed 09/2023 due to pain  -Plavix as above   HTN  -BP elevated in clinic, pt insists it is related to coming to the clinic and that years ago he was told that 140 systolic was ok for him.  -asked him to monitor his blood pressure at home, call if remains elevated   Follow up with Francis Dowse, PA-C as planned in 01/2024.   Signed, Canary Brim, MSN, APRN, NP-C, AGACNP-BC [facility] - Electrophysiology  10/24/2023, 1:01 PM

## 2023-10-24 ENCOUNTER — Ambulatory Visit: Payer: Medicare Other | Attending: Pulmonary Disease | Admitting: Pulmonary Disease

## 2023-10-24 ENCOUNTER — Encounter: Payer: Self-pay | Admitting: Pulmonary Disease

## 2023-10-24 VITALS — BP 140/78 | HR 101 | Ht 69.0 in | Wt 170.6 lb

## 2023-10-24 DIAGNOSIS — I480 Paroxysmal atrial fibrillation: Secondary | ICD-10-CM

## 2023-10-24 DIAGNOSIS — I639 Cerebral infarction, unspecified: Secondary | ICD-10-CM | POA: Diagnosis not present

## 2023-10-24 MED ORDER — CLOPIDOGREL BISULFATE 75 MG PO TABS: 75.0000 mg | ORAL_TABLET | Freq: Every day | ORAL | 3 refills | Status: DC

## 2023-10-24 NOTE — Patient Instructions (Addendum)
Medication Instructions:  Your physician has recommended you make the following change in your medication:  START PLAVIX 75 MG DAILY.   *If you need a refill on your cardiac medications before your next appointment, please call your pharmacy*   Lab Work:  NONE If you have labs (blood work) drawn today and your tests are completely normal, you will receive your results only by: MyChart Message (if you have MyChart) OR A paper copy in the mail If you have any lab test that is abnormal or we need to change your treatment, we will call you to review the results.   Testing/Procedures: NONE   Follow-Up: At [facility], you and your health needs are our priority.  As part of our continuing mission to provide you with exceptional heart care, we have created designated Provider Care Teams.  These Care Teams include your primary Cardiologist (physician) and Advanced Practice Providers (APPs -  Physician Assistants and Nurse Practitioners) who all work together to provide you with the care you need, when you need it.  We recommend signing up for the patient portal called "MyChart".  Sign up information is provided on this After Visit Summary.  MyChart is used to connect with patients for Virtual Visits (Telemedicine).  Patients are able to view lab/test results, encounter notes, upcoming appointments, etc.  Non-urgent messages can be sent to your provider as well.   To learn more about what you can do with MyChart, go to [email or web address].    Your next appointment:   6 month(s)  Provider:   Lewayne Bunting, MD

## 2023-11-13 ENCOUNTER — Other Ambulatory Visit: Payer: Self-pay

## 2023-11-13 NOTE — Patient Outreach (Signed)
 Telephone outreach to patient to obtain mRS was successfully completed. MRS= 0  Vanice Sarah Care Management Assistant [phone]

## 2023-11-20 ENCOUNTER — Encounter: Payer: Medicare Other | Admitting: Physical Medicine & Rehabilitation

## 2023-12-04 DIAGNOSIS — R350 Frequency of micturition: Secondary | ICD-10-CM | POA: Diagnosis not present

## 2023-12-04 DIAGNOSIS — N401 Enlarged prostate with lower urinary tract symptoms: Secondary | ICD-10-CM | POA: Diagnosis not present

## 2023-12-04 DIAGNOSIS — R972 Elevated prostate specific antigen [PSA]: Secondary | ICD-10-CM | POA: Diagnosis not present

## 2023-12-04 DIAGNOSIS — R351 Nocturia: Secondary | ICD-10-CM | POA: Diagnosis not present

## 2023-12-23 ENCOUNTER — Encounter: Payer: Self-pay | Admitting: Physical Medicine & Rehabilitation

## 2023-12-23 ENCOUNTER — Encounter: Payer: Medicare Other | Attending: Physical Medicine & Rehabilitation | Admitting: Physical Medicine & Rehabilitation

## 2023-12-23 VITALS — BP 178/92 | HR 99 | Ht 69.0 in | Wt 168.0 lb

## 2023-12-23 DIAGNOSIS — S4991XS Unspecified injury of right shoulder and upper arm, sequela: Secondary | ICD-10-CM | POA: Insufficient documentation

## 2023-12-23 DIAGNOSIS — X58XXXS Exposure to other specified factors, sequela: Secondary | ICD-10-CM | POA: Diagnosis not present

## 2023-12-23 DIAGNOSIS — M1711 Unilateral primary osteoarthritis, right knee: Secondary | ICD-10-CM | POA: Diagnosis not present

## 2023-12-23 DIAGNOSIS — M25511 Pain in right shoulder: Secondary | ICD-10-CM | POA: Insufficient documentation

## 2023-12-23 MED ORDER — BETAMETHASONE SOD PHOS & ACET 6 (3-3) MG/ML IJ SUSP
3.0000 mg | Freq: Once | INTRAMUSCULAR | Status: AC
Start: 2023-12-23 — End: 2023-12-23
  Administered 2023-12-23: 3 mg via INTRA_ARTICULAR

## 2023-12-23 MED ORDER — LIDOCAINE HCL 1 % IJ SOLN
5.0000 mL | Freq: Once | INTRAMUSCULAR | Status: AC
Start: 2023-12-23 — End: 2023-12-23
  Administered 2023-12-23: 5 mL

## 2023-12-23 NOTE — Progress Notes (Signed)
Acromioclavicular joint injection under ultrasound guidance  Indication is for anterior shoulder pain with imaging studies demonstrating acromioclavicular joint arthropathy Pain is only partially responsive to medication management and other conservative care. Pain interferes with ADLs and sleep  The patient was placed in a seated position the acromioclavicular joint was scanned in the long axis view, areas marked prepped with Betadine, sterile technique was utilized. Under direct long axis view 1% lidocaine was infiltrated to the skin and subcutaneous tissues. A 25-gauge 1.5 inch needle reached the acromioclavicular joint space. Then a solution containing 1 mL of lidocaine 1% and 0.5 ML of Celestone 6 mg per mL was injected. Patient tolerated procedure well. Images were saved. Postprocedure instructions given 

## 2023-12-23 NOTE — Patient Instructions (Signed)
Your stroke was in a location which would cause arm weakness.  Your Right shoulder has severe rotator cuff problems as well but this would cause only shoulder weakness.

## 2023-12-29 ENCOUNTER — Encounter: Payer: Self-pay | Admitting: Family Medicine

## 2023-12-29 ENCOUNTER — Ambulatory Visit (INDEPENDENT_AMBULATORY_CARE_PROVIDER_SITE_OTHER): Payer: Medicare Other | Admitting: Family Medicine

## 2023-12-29 VITALS — BP 130/80 | HR 92 | Temp 97.9°F | Ht 69.0 in | Wt 171.8 lb

## 2023-12-29 DIAGNOSIS — I10 Essential (primary) hypertension: Secondary | ICD-10-CM | POA: Diagnosis not present

## 2023-12-29 DIAGNOSIS — Z131 Encounter for screening for diabetes mellitus: Secondary | ICD-10-CM

## 2023-12-29 DIAGNOSIS — Z0000 Encounter for general adult medical examination without abnormal findings: Secondary | ICD-10-CM

## 2023-12-29 DIAGNOSIS — Z0001 Encounter for general adult medical examination with abnormal findings: Secondary | ICD-10-CM

## 2023-12-29 DIAGNOSIS — I2510 Atherosclerotic heart disease of native coronary artery without angina pectoris: Secondary | ICD-10-CM

## 2023-12-29 DIAGNOSIS — I480 Paroxysmal atrial fibrillation: Secondary | ICD-10-CM

## 2023-12-29 DIAGNOSIS — E785 Hyperlipidemia, unspecified: Secondary | ICD-10-CM | POA: Diagnosis not present

## 2023-12-29 DIAGNOSIS — R739 Hyperglycemia, unspecified: Secondary | ICD-10-CM

## 2023-12-29 DIAGNOSIS — G720 Drug-induced myopathy: Secondary | ICD-10-CM

## 2023-12-29 MED ORDER — CARVEDILOL 3.125 MG PO TABS: 3.1250 mg | ORAL_TABLET | Freq: Two times a day (BID) | ORAL | 3 refills | Status: DC

## 2023-12-29 NOTE — Progress Notes (Signed)
Phone: [phone]   Subjective:  Patient presents today for their annual physical. Chief complaint-noted.   See problem oriented charting- ROS- full  review of systems was completed and negative  Per full ROS sheet completed by patient other than poor sleep and fatigue  The following were reviewed and entered/updated in epic: Past Medical History:  Diagnosis Date   Acute prostatitis 02/04/2008   ATRIAL MYXOMA 02/04/2008   Closed fracture of four ribs 11/01/2009   HYPERLIPIDEMIA 02/04/2008   HYPERTENSION 02/04/2008   Osteoarthritis of right knee 04/18/2010   Patient Active Problem List   Diagnosis Date Noted   SVT (supraventricular tachycardia) (HCC) 10/08/2023    Priority: High   Paroxysmal atrial fibrillation (HCC) 12/21/2021    Priority: High   CAD (coronary artery disease) 02/17/2015    Priority: High   s/p excision of Atrial myxoma 02/04/2008    Priority: High   BPH associated with nocturia 04/10/2015    Priority: Medium    Hyperlipidemia 02/04/2008    Priority: Medium    Essential hypertension 02/04/2008    Priority: Medium    Leg length difference, acquired 01/07/2018    Priority: Low   Rotator cuff syndrome of right shoulder 12/08/2017    Priority: Low   Health care maintenance 02/18/2015    Priority: Low   Erectile dysfunction 02/18/2015    Priority: Low   Heel pain 03/22/2011    Priority: Low   GERD (gastroesophageal reflux disease) 03/22/2011    Priority: Low   Osteoarthritis of right knee 04/18/2010    Priority: Low   Past Surgical History:  Procedure Laterality Date   CATARACT EXTRACTION Left 08/03/2019   CORONARY ARTERY BYPASS GRAFT  2007   x2 Saph vein   COX-MAZE MICROWAVE ABLATION  2007   EXCISION OF ATRIAL MYXOMA  2007   Dr C. Owen   LOOP RECORDER INSERTION N/A 08/05/2023   Procedure: LOOP RECORDER INSERTION;  Surgeon: Maurice Small, MD;  Location: [facility];  Service: Cardiovascular;  Laterality: N/A;    Family History  Problem  Relation Age of Onset   Stroke Mother    Heart disease Father        MI 7   Hypertension Father    Ovarian cancer Sister     Medications- reviewed and updated Current Outpatient Medications  Medication Sig Dispense Refill   calcium carbonate (TUMS - DOSED IN MG ELEMENTAL CALCIUM) 500 MG chewable tablet Chew 1,000 mg by mouth in the morning and at bedtime.     clopidogrel (PLAVIX) 75 MG tablet Take 1 tablet (75 mg total) by mouth daily. 90 tablet 3   ezetimibe (ZETIA) 10 MG tablet Take 1 tablet (10 mg total) by mouth daily. 60 tablet 0   lisinopril (ZESTRIL) 20 MG tablet Take 1 tablet (20 mg total) by mouth daily. 90 tablet 3   rosuvastatin (CRESTOR) 20 MG tablet Take 1 tablet (20 mg total) by mouth daily. 90 tablet 3   tamsulosin (FLOMAX) 0.4 MG CAPS capsule Take 0.4 mg by mouth daily.     carvedilol (COREG) 3.125 MG tablet Take 1 tablet (3.125 mg total) by mouth 2 (two) times daily with a meal. 60 tablet 3   No current facility-administered medications for this visit.    Allergies-reviewed and updated Allergies  Allergen Reactions   Hydrocodone Palpitations    After rib fractures. Hydrocodone-States had shortness of breath, 911 was called    Loratadine Anxiety and Other (See Comments)    Heart speeds  up     Social History   Social History Narrative   Married will be 50 years in 2019 (wife seen at another practice). 2 children-daughter unmarried, professional student 62 and son 17 in 2016. 2 grandsons-17 and 52 in 2019.       Still works as Product/process development scientist, does his own carpentry work      Presenter, broadcasting: fishing   Objective  Objective:  BP 130/80   Pulse 92   Temp 97.9 F (36.6 C)   Ht 5\' 9"  (1.753 m)   Wt 171 lb 12.8 oz (77.9 kg)   SpO2 98%   BMI 25.37 kg/m  Gen: NAD, resting comfortably HEENT: Mucous membranes are moist. Oropharynx normal Neck: no thyromegaly CV: RRR no murmurs rubs or gallops Lungs: CTAB no crackles, wheeze, rhonchi Abdomen:  soft/nontender/nondistended/normal bowel sounds. No rebound or guarding.  Ext: no edema Skin: warm, dry Neuro: grossly normal, moves all extremities, PERRLA    Assessment and Plan  78 y.o. male presenting for annual physical.  Health Maintenance counseling: 1. Anticipatory guidance: Patient counseled regarding regular dental exams -q6 months, eye exams -yearly,  avoiding smoking and second hand smoke , limiting alcohol to 2 beverages per day -doesn't drink, no illicit drugs .   2. Risk factor reduction:  Advised patient of need for regular exercise and diet rich and fruits and vegetables to reduce risk of heart attack and stroke.  Exercise- daily! .  Diet/weight management-weight largely stable over the last few months.  Wt Readings from Last 3 Encounters:  12/29/23 171 lb 12.8 oz (77.9 kg)  12/23/23 168 lb (76.2 kg)  10/24/23 170 lb 9.6 oz (77.4 kg)  3. Immunizations/screenings/ancillary studies-declines flu shot and Prevnar 20 and COVID-19 and Shingrix  Immunization History  Administered Date(s) Administered   Influenza Whole 09/01/2009   Influenza,inj,Quad PF,6+ Mos 01/14/2014   Pneumococcal Polysaccharide-23 10/03/2009   Td 05/05/2009   Tdap 08/01/2014  4. Prostate cancer screening- follows with urology for BPH and elevated PSA- reports came back down with urology - just saw January 5th- they want to be conservative at his age Lab Results  Component Value Date   PSA 4.80 (H) 06/16/2023   PSA 3.74 11/12/2022   PSA 4.19 (H) 04/27/2021   5. Colon cancer screening - 05/23/21- due for final cologuard if normal at next visit 6. Skin cancer screening- [facility] as needed- plans to scheduleadvised regular sunscreen use. Denies worrisome, changing, or new skin lesions.  7. Smoking associated screening (lung cancer screening, AAA screen 65-75, UA)-never smoker 8. STD screening - only active with wife  Status of chronic or acute concerns   # Social update-in October wife had  heart attack and he had to quit work to care for himself and his wife - she is doing much better and has recovered well!   #Right shoulder/hand pain- working with orthopedics. Fall out of truck onto [employer] in the past.   #history of stroke may 2024- clopidogrel 75 mg added for 21 days- right upper extremity weakness and mild right facial droop- -Later with recurrent stroke August 2024 and loop recorder placedwith plan for aspirin and Plavix for 3 months then transition to aspirin alonebut he later opted to remove loop recorder but given high probability of embolic stroke to go on Eliquis but developed SVT on Eliquis and he decided to go back to clopidogrel even though strong cardiology preference was to remain on Eliquis and no guarantee of stroke reduction could be promised on the  clopidogrel  #SVT-occurred 8 days after switching to Eliquis and he was very concerned that Eliquis was the cause of his SVT and he switched back to clopidogrel despite cryptogenic stroke in May 2024 with recurrence in August 2024 with strong probability of embolic stroke -Patient firmly declines Eliquis, Xarelto, Coumadin and has been informed by cardiology that this does not protect him from further embolic stroke which is the most likely cause of his symptoms.  He is aware of this but strongly prefers Plavix/clipidogreal -still getting some palpitations- metoprolol with fatigue and dizziness in past and wants to hold off on this but see hypertension section  #CAD- 2 vessel bypass done primarily due to already having open heart for atrial myxoma removal #hyperlipidemia- statin myalgia  S: Medication: rosuvastatin 20 mg (statin myalgia on others or higher dose), clopidogrel 75 mg daily -prescription zetia 10 mg   - declines 06/2023 but later started this on its own and stopped rosuvastin Lab Results  Component Value Date   CHOL 162 08/02/2023   HDL 42 08/02/2023   LDLCALC 101 (H) 08/02/2023   LDLDIRECT 87.0 06/16/2023    TRIG 94 08/02/2023   CHOLHDL 3.9 08/02/2023   A/P: suspect poorly controlled on zetia only- advised to restart his rosuvastatin and recheck labs in 6 weeks  #hypertension See separate problem oriented note  # Hyperglycemia/insulin resistance/prediabetes- peak a1c of 6.02 Apr 2023- prior peak of 5.7 S:  Medication: none Lab Results  Component Value Date   HGBA1C 5.9 (H) 08/02/2023   HGBA1C 6.1 (H) 04/21/2023   HGBA1C  10/20/2009    5.7 (NOTE) The ADA recommends the following therapeutic goal for glycemic control related to Hgb A1c measurement: Goal of therapy: <6.5 Hgb A1c  Reference: American Diabetes Association: Clinical Practice Recommendations 2010, Diabetes Care, 2010, 33: (Suppl  1).  A/P: hopefully stable- update a1c today. Continue without meds for now   Recommended follow up: Return in about 4 months (around 04/27/2024) for followup or sooner if needed.Schedule b4 you leave. Future Appointments  Date Time Provider Department Center  01/19/2024  1:30 PM [name], PA-C [facility]  02/03/2024  8:45 AM Windell Norfolk, MD [facility] None  02/09/2024  9:00 AM [facility]  02/20/2024  3:30 PM Kirsteins, Victorino Sparrow, MD [facility]  03/29/2024  1:00 PM [facility] ANNUAL WELLNESS VISIT 1 [facility]  04/29/2024  9:20 AM Durene Cal Aldine Contes, MD [facility]    Lab/Order associations: fasting   ICD-10-CM   1. Encounter for general adult medical examination with abnormal findings  Z00.01     2. Essential hypertension  I10 Comprehensive metabolic panel    CBC with Differential/Platelet    Lipid panel    3. Coronary artery disease involving native coronary artery of native heart without angina pectoris  I25.10     4. Paroxysmal atrial fibrillation (HCC)  I48.0     5. Hyperlipidemia, unspecified hyperlipidemia type  E78.5 Comprehensive metabolic panel    CBC with Differential/Platelet    Lipid panel    6. Drug-induced myopathy  G72.0     7. Screening  for diabetes mellitus  Z13.1 HgB A1c    8. Hyperglycemia  R73.9 HgB A1c      Meds ordered this encounter  Medications   DISCONTD: carvedilol (COREG) 3.125 MG tablet    Sig: Take 1 tablet (3.125 mg total) by mouth 2 (two) times daily with a meal.    Dispense:  60 tablet    Refill:  3   carvedilol (COREG) 3.125 MG tablet    Sig: Take 1 tablet (3.125 mg total) by mouth 2 (two) times daily with a meal.    Dispense:  60 tablet    Refill:  3    Return precautions advised.  Tana Conch, MD

## 2023-12-29 NOTE — Assessment & Plan Note (Addendum)
S: medication:  lisinopril 20 mg -stopped amlodipine after starting tamsulosin due to low blood pressure   BP Readings from Last 3 Encounters:  12/29/23 130/80  12/23/23 (!) 178/92  10/24/23 (!) 140/78  A/P: blood pressure controlled thankfully despite stopping amlodipine- if blood pressure regularly above 135/85 please let us know- may need to restart amlodipine perhaps at 2.5 mg -hoping carvedilol very low dose may help blood pressure as well as reduce palpitations with possible recurrence of A-fib versus SVT and this was ordered at 3.125 mg twice daily-also encourage close cardiology follow-up which he has in 3 weeks

## 2023-12-29 NOTE — Progress Notes (Signed)
Phone [phone] In person visit   Subjective:   Ethan Payne is a 78 y.o. year old very pleasant male patient who presents for/with See problem oriented charting  Past Medical History-  Patient Active Problem List   Diagnosis Date Noted   SVT (supraventricular tachycardia) (HCC) 10/08/2023    Priority: High   Paroxysmal atrial fibrillation (HCC) 12/21/2021    Priority: High   CAD (coronary artery disease) 02/17/2015    Priority: High   s/p excision of Atrial myxoma 02/04/2008    Priority: High   BPH associated with nocturia 04/10/2015    Priority: Medium    Hyperlipidemia 02/04/2008    Priority: Medium    Essential hypertension 02/04/2008    Priority: Medium    Leg length difference, acquired 01/07/2018    Priority: Low   Rotator cuff syndrome of right shoulder 12/08/2017    Priority: Low   Health care maintenance 02/18/2015    Priority: Low   Erectile dysfunction 02/18/2015    Priority: Low   Heel pain 03/22/2011    Priority: Low   GERD (gastroesophageal reflux disease) 03/22/2011    Priority: Low   Osteoarthritis of right knee 04/18/2010    Priority: Low    Medications- reviewed and updated Current Outpatient Medications  Medication Sig Dispense Refill   calcium carbonate (TUMS - DOSED IN MG ELEMENTAL CALCIUM) 500 MG chewable tablet Chew 1,000 mg by mouth in the morning and at bedtime.     clopidogrel (PLAVIX) 75 MG tablet Take 1 tablet (75 mg total) by mouth daily. 90 tablet 3   ezetimibe (ZETIA) 10 MG tablet Take 1 tablet (10 mg total) by mouth daily. 60 tablet 0   lisinopril (ZESTRIL) 20 MG tablet Take 1 tablet (20 mg total) by mouth daily. 90 tablet 3   rosuvastatin (CRESTOR) 20 MG tablet Take 1 tablet (20 mg total) by mouth daily. 90 tablet 3   tamsulosin (FLOMAX) 0.4 MG CAPS capsule Take 0.4 mg by mouth daily.     carvedilol (COREG) 3.125 MG tablet Take 1 tablet (3.125 mg total) by mouth 2 (two) times daily with a meal. 60 tablet 3   No current  facility-administered medications for this visit.     Objective:  BP 130/80   Pulse 92   Temp 97.9 F (36.6 C)   Ht 5\' 9"  (1.753 m)   Wt 171 lb 12.8 oz (77.9 kg)   SpO2 98%   BMI 25.37 kg/m  Gen: NAD, resting comfortably     Assessment and Plan   Essential hypertension/palpitations in someone with history svt and a fib  S: medication:  lisinopril 20 mg -stopped amlodipine after starting tamsulosin due to low blood pressure   BP Readings from Last 3 Encounters:  12/29/23 130/80  12/23/23 (!) 178/92  10/24/23 (!) 140/78  A/P: blood pressure controlled thankfully despite stopping amlodipine- if blood pressure regularly above 135/85 please let us know- may need to restart amlodipine perhaps at 2.5 mg -hoping carvedilol very low dose may help blood pressure as well as reduce palpitations and this was ordered at 3.125 mg twice daily  Recommended follow up: Return in about 4 months (around 04/27/2024). Future Appointments  Date Time Provider Department Center  01/19/2024  1:30 PM [name], PA-C [facility]  02/03/2024  8:45 AM Windell Norfolk, MD [facility] None  02/09/2024  9:00 AM [facility]  02/20/2024  3:30 PM Kirsteins, Victorino Sparrow, MD [facility]  03/29/2024  1:00 PM [facility] ANNUAL  WELLNESS VISIT 1 [facility]  04/29/2024  9:20 AM Charla Criscione, Aldine Contes, MD [facility]    Lab/Order associations:   ICD-10-CM   1. Essential hypertension  I10 Comprehensive metabolic panel    CBC with Differential/Platelet    Lipid panel    Meds ordered this encounter  Medications   DISCONTD: carvedilol (COREG) 3.125 MG tablet    Sig: Take 1 tablet (3.125 mg total) by mouth 2 (two) times daily with a meal.    Dispense:  60 tablet    Refill:  3   carvedilol (COREG) 3.125 MG tablet    Sig: Take 1 tablet (3.125 mg total) by mouth 2 (two) times daily with a meal.    Dispense:  60 tablet    Refill:  3    Return precautions advised.  Tana Conch, MD

## 2023-12-29 NOTE — Patient Instructions (Addendum)
Come back for fasting labs in 6 weeks- schedule before you leave  blood pressure controlled thankfully despite stopping amlodipine- if blood pressure regularly above 135/85 please let us know- may need to restart amlodipine perhaps at 2.5 mg -hoping carvedilol very low dose may help blood pressure as well as reduce palpitations- perhaps update [place] in 2 weeks with how you are doing  Recommended follow up: Return in about 4 months (around 04/27/2024) for followup or sooner if needed.Schedule b4 you leave.

## 2024-01-06 DIAGNOSIS — N401 Enlarged prostate with lower urinary tract symptoms: Secondary | ICD-10-CM | POA: Diagnosis not present

## 2024-01-06 DIAGNOSIS — R3915 Urgency of urination: Secondary | ICD-10-CM | POA: Diagnosis not present

## 2024-01-06 DIAGNOSIS — R3912 Poor urinary stream: Secondary | ICD-10-CM | POA: Diagnosis not present

## 2024-01-13 ENCOUNTER — Ambulatory Visit: Payer: Medicare Other | Admitting: Neurology

## 2024-01-13 DIAGNOSIS — N401 Enlarged prostate with lower urinary tract symptoms: Secondary | ICD-10-CM | POA: Diagnosis not present

## 2024-01-13 DIAGNOSIS — R350 Frequency of micturition: Secondary | ICD-10-CM | POA: Diagnosis not present

## 2024-01-13 DIAGNOSIS — R3915 Urgency of urination: Secondary | ICD-10-CM | POA: Diagnosis not present

## 2024-01-13 DIAGNOSIS — R351 Nocturia: Secondary | ICD-10-CM | POA: Diagnosis not present

## 2024-01-19 ENCOUNTER — Ambulatory Visit: Payer: Medicare Other | Admitting: Physician Assistant

## 2024-02-03 ENCOUNTER — Encounter: Payer: Self-pay | Admitting: Neurology

## 2024-02-03 ENCOUNTER — Inpatient Hospital Stay: Payer: Medicare Other | Admitting: Neurology

## 2024-02-09 ENCOUNTER — Other Ambulatory Visit: Payer: Medicare Other

## 2024-02-10 ENCOUNTER — Encounter: Payer: Self-pay | Admitting: Neurology

## 2024-02-10 ENCOUNTER — Ambulatory Visit: Admitting: Neurology

## 2024-02-10 VITALS — BP 138/88 | HR 90 | Ht 66.0 in | Wt 171.0 lb

## 2024-02-10 DIAGNOSIS — I63512 Cerebral infarction due to unspecified occlusion or stenosis of left middle cerebral artery: Secondary | ICD-10-CM

## 2024-02-10 NOTE — Patient Instructions (Signed)
 Continue current medications  Continue to follow up with your doctors  Call for any questions  Return in 6 months or sooner if worse

## 2024-02-10 NOTE — Progress Notes (Signed)
 [facility]  PATIENT: Ethan Payne DOB: 1946-01-22  REQUESTING CLINICIAN: Marvel Plan, MD HISTORY FROM: Patient/Chart review  REASON FOR VISIT: Left hemispheric stroke    HISTORICAL  CHIEF COMPLAINT:  Chief Complaint  Patient presents with   New Patient (Initial Visit)    Pt in 12, here alone Pt is here for hospital follow up after stroke. Pt states     INTERVAL HISTORY 02/10/2024:  Patient presents today for follow-up, last visit was in August 2024.  In September 2024 he was admitted to hospital, found to have new left hemispheric strokes.  Stroke etiology likely cardioembolic but cannot rule out large vessel disease.  Patient was restarted on DAPT for 3 months and had an implanted loop recorder.  He tells me that he wore the loop recorder for about 7 weeks but it was causing extreme pain therefore the loop recorder was removed but patient agreed to taking Eliquis.  He reports 8 days while being on Eliquis, he had an episode of SVT, taken to the hospital diagnosed with atrial fibrillation.  Patient was strongly recommended anticoagulation but he refused and remained on the Plavix.  He reports compliance with medications, has not had any additional events.   HISTORY OF PRESENT ILLNESS:  This is a 78 year old gentleman past medical history hypertension, hyperlipidemia who presented to the hospital in May 20 with acute right-sided weakness.  CT head did not show any acute abnormality, CT angiogram did not show any large vessel occlusion.  He was given TNK, tolerated the procedure well.  His follow-up MRI showed multiple foci of acute strokes along the left precentral and postcentral gyri.  He did well in terms of recovery, prior to hospitalization, he was not on aspirin and during admission he was started on DAPT, aspirin and Plavix for 21-days.  He completed that regimen and currently on aspirin alone.  He has also completed physical therapy.  Currently patient  reports that he is back to his normal self, no deficits in terms of the right hand weakness.  He is compliant with his medications including Crestor 20 mg, stated that he cannot take 40 mg due to muscle cramps.  He is also on amlodipine and Lisinopril for blood pressure.  Currently he does not have any concerns, no deficit.       Hospital course and summary  Brief HPI:   Ethan Payne is a 78 y.o. male  who presented to the [facility] ED on 04/21/2023 with acute right-sided weakness. Code stroke initiated. Head CT showed no acute process. Evaluated by Dr. Selina Cooley and risks, benefits, and alternatives to TNK were discussed with patient who gave informed consent to proceed after further discussion with his adult son. CTA showed no LVO therefore no intervention indicated. TNK administered at 18:23 hours. Admitted to ICU. On 5/21, right weakness had improved, right facial droop present, some ataxia continued in right arm, weaker grip right hand, right hand bilateral sensation intact. Blood pressure adequately controlled. 2D Echo: EF 55 to 60%, mild concentric LVH, trivial MVR, trivial AVR, no evidence of left atrial mass, no shunt. LDL 219, HgbA1c 6.1%. VTE prophylaxis - SCDs. On 5/22, the patient has reported right upper extremity proximal weakness due to rotator cuff tear and right leg weakness due to a "bad knee". Right grip remains weaker, mild right facial droop persists. MRI brain with multiple small foci of acute ischemia along the left precentral and postcentral gyri. No hemorrhage or mass effect. Old bilateral cerebellar small  vessel infarcts and findings of chronic follows all ischemia. Started on aspirin and Plavix for 21 days followed by aspirin alone. Patient reports statin intolerance and is refusing further statin therapy Discussed Leqvio injections with patient pending insurance authorization. May need outpatient follow up with lipid clinic in the setting of hyperlipidemia and statin intolerance per  Dr. Pearlean Brownie. The patient has impairment mobility needing up to mod assist for ambulation due to right LE deficits. He fatigues quickly, has poor safety awareness and appears to have some right inattention as well. He is tolerating regular diet. Norvasc and Zestril continue.      Hospital Course: Ethan Payne was admitted to rehab 04/24/2023 for inpatient therapies to consist of PT, ST and OT at least three hours five days a week. Past admission physiatrist, therapy team and rehab RN have worked together to provide customized collaborative inpatient rehab. 5/24: Follow-up labs with small increase in serum creatinine; BUN normal. He developed return of RUE weakness and facial droop and code stroke called. CT head without bleed. Likely worsening of recent deficit in setting of mild dehydration physical exertion. Given IVFs and rest from therapy. Encouraged PO fluids. VS and NIHSS continued x 2 hours. Due to transient right arm numbness and weakness, positive Jobe's test and history of rotator cuff tear, MRI of right shoulder obtained. Will consider shoulder injection and/or EMG at follow-up with PM&R. Cervical MRI has forminal stenosis C4-5 and C5-6. Given Voltaren gel to try.    Blood pressures were monitored on TID basis and amlodipine 5 mg daily lisinopril 20 mg daily were continued. Reduced lisinopril to 10 mg. Reduced amlodipine to 2.5 mg for one day then increased back to 5 mg at discharge.   Prediabetes has been monitored with ac/hs CBG checks and carb modified diet.   Rehab course: During patient's stay in rehab weekly team conferences were held to monitor patient's progress, set goals and discuss barriers to discharge. At admission, patient was  independent  with functional mobility and with basic self-care skills.   He has had improvement in activity tolerance, balance, postural control as well as ability to compensate for deficits. He has had improvement in functional use RUE as well as  improvement in awareness. He will follow-up with outpatient PT at [facility]  OTHER MEDICAL CONDITIONS: Hypertension, Hyperlipidemia    REVIEW OF SYSTEMS: Full 14 system review of systems performed and negative with exception of: As noted in the HPI   ALLERGIES: Allergies  Allergen Reactions   Hydrocodone Palpitations    After rib fractures. Hydrocodone-States had shortness of breath, 911 was called    Loratadine Anxiety and Other (See Comments)    Heart speeds up     HOME MEDICATIONS: Outpatient Medications Prior to Visit  Medication Sig Dispense Refill   calcium carbonate (TUMS - DOSED IN MG ELEMENTAL CALCIUM) 500 MG chewable tablet Chew 1,000 mg by mouth in the morning and at bedtime.     carvedilol (COREG) 3.125 MG tablet Take 1 tablet (3.125 mg total) by mouth 2 (two) times daily with a meal. 60 tablet 3   clopidogrel (PLAVIX) 75 MG tablet Take 1 tablet (75 mg total) by mouth daily. 90 tablet 3   ezetimibe (ZETIA) 10 MG tablet Take 1 tablet (10 mg total) by mouth daily. 60 tablet 0   lisinopril (ZESTRIL) 20 MG tablet Take 1 tablet (20 mg total) by mouth daily. 90 tablet 3   rosuvastatin (CRESTOR) 20 MG tablet Take 1 tablet (20 mg total)  by mouth daily. 90 tablet 3   tamsulosin (FLOMAX) 0.4 MG CAPS capsule Take 0.4 mg by mouth daily.     No facility-administered medications prior to visit.    PAST MEDICAL HISTORY: Past Medical History:  Diagnosis Date   Acute prostatitis 02/04/2008   ATRIAL MYXOMA 02/04/2008   Closed fracture of four ribs 11/01/2009   HYPERLIPIDEMIA 02/04/2008   HYPERTENSION 02/04/2008   Osteoarthritis of right knee 04/18/2010    PAST SURGICAL HISTORY: Past Surgical History:  Procedure Laterality Date   CATARACT EXTRACTION Left 08/03/2019   CORONARY ARTERY BYPASS GRAFT  2007   x2 Saph vein   COX-MAZE MICROWAVE ABLATION  2007   EXCISION OF ATRIAL MYXOMA  2007   Dr C. Owen   LOOP RECORDER INSERTION N/A 08/05/2023   Procedure: LOOP RECORDER INSERTION;   Surgeon: Maurice Small, MD;  Location: [facility] INVASIVE CV LAB;  Service: Cardiovascular;  Laterality: N/A;    FAMILY HISTORY: Family History  Problem Relation Age of Onset   Stroke Mother    Heart disease Father        MI 72   Hypertension Father    Ovarian cancer Sister     SOCIAL HISTORY: Social History   Socioeconomic History   Marital status: Married    Spouse name: Not on file   Number of children: Not on file   Years of education: Not on file   Highest education level: Not on file  Occupational History   Occupation: married  Tobacco Use   Smoking status: Never   Smokeless tobacco: Never  Substance and Sexual Activity   Alcohol use: No   Drug use: No   Sexual activity: Yes  Other Topics Concern   Not on file  Social History Narrative   Married will be 50 years in 2019 (wife seen at another practice). 2 children-daughter unmarried, professional student 32 and son 33 in 2016. 2 grandsons-17 and 66 in 2019.       Still works as Product/process development scientist, does his own carpentry work      Hobbies: fishing   Social Drivers of Corporate investment banker Strain: Low Risk  (03/24/2023)   Overall Financial Resource Strain (CARDIA)    Difficulty of Paying Living Expenses: Not hard at all  Food Insecurity: No Food Insecurity (08/07/2023)   Hunger Vital Sign    Worried About Running Out of Food in the Last Year: Never true    Ran Out of Food in the Last Year: Never true  Transportation Needs: No Transportation Needs (04/23/2023)   PRAPARE - Administrator, Civil Service (Medical): No    Lack of Transportation (Non-Medical): No  Physical Activity: Insufficiently Active (03/24/2023)   Exercise Vital Sign    Days of Exercise per Week: 3 days    Minutes of Exercise per Session: 30 min  Stress: No Stress Concern Present (03/24/2023)   [employer] of Occupational Health - Occupational Stress Questionnaire    Feeling of Stress : Not at all  Social Connections:  Moderately Isolated (03/24/2023)   Social Connection and Isolation Panel [NHANES]    Frequency of Communication with Friends and Family: More than three times a week    Frequency of Social Gatherings with Friends and Family: More than three times a week    Attends Religious Services: Never    Database administrator or Organizations: No    Attends Banker Meetings: Never    Marital Status: Married  Intimate  Partner Violence: Not At Risk (04/23/2023)   Humiliation, Afraid, Rape, and Kick questionnaire    Fear of Current or Ex-Partner: No    Emotionally Abused: No    Physically Abused: No    Sexually Abused: No     PHYSICAL EXAM  GENERAL EXAM/CONSTITUTIONAL: Vitals:  Vitals:   02/10/24 1505  BP: 138/88  Pulse: 90  Weight: 171 lb (77.6 kg)  Height: 5\' 6"  (1.676 m)   Body mass index is 27.6 kg/m. Wt Readings from Last 3 Encounters:  02/10/24 171 lb (77.6 kg)  12/29/23 171 lb 12.8 oz (77.9 kg)  12/23/23 168 lb (76.2 kg)   Patient is in no distress; well developed, nourished and groomed; neck is supple  MUSCULOSKELETAL: Gait, strength, tone, movements noted in Neurologic exam below  NEUROLOGIC: MENTAL STATUS:      No data to display         awake, alert, oriented to person, place and time recent and remote memory intact normal attention and concentration language fluent, comprehension intact, naming intact fund of knowledge appropriate  CRANIAL NERVE:  2nd, 3rd, 4th, 6th - Visual fields full to confrontation, extraocular muscles intact, no nystagmus 5th - facial sensation symmetric 7th - facial strength symmetric 8th - hearing intact 9th - palate elevates symmetrically, uvula midline 11th - shoulder shrug symmetric 12th - tongue protrusion midline  MOTOR:  normal bulk and tone, full strength in the BUE, BLE  SENSORY:  normal and symmetric to light touch  COORDINATION:  finger-nose-finger, fine finger movements normal  GAIT/STATION:   normal   DIAGNOSTIC DATA (LABS, IMAGING, TESTING) - I reviewed patient records, labs, notes, testing and imaging myself where available.  Lab Results  Component Value Date   WBC 8.8 10/02/2023   HGB 13.6 10/02/2023   HCT 40.5 10/02/2023   MCV 91.4 10/02/2023   PLT 218 10/02/2023      Component Value Date/Time   NA 137 10/02/2023 1624   K 3.5 10/02/2023 1624   CL 104 10/02/2023 1624   CO2 26 10/02/2023 1624   GLUCOSE 129 (H) 10/02/2023 1624   BUN 15 10/02/2023 1624   CREATININE 1.13 10/02/2023 1624   CALCIUM 9.0 10/02/2023 1624   PROT 8.0 08/01/2023 1310   ALBUMIN 4.4 08/01/2023 1310   AST 25 08/01/2023 1310   ALT 29 08/01/2023 1310   ALKPHOS 39 08/01/2023 1310   BILITOT 1.0 08/01/2023 1310   GFRNONAA >60 10/02/2023 1624   GFRAA  10/19/2009 0315    >60        The eGFR has been calculated using the MDRD equation. This calculation has not been validated in all clinical situations. eGFR's persistently <60 mL/min signify possible Chronic Kidney Disease.   Lab Results  Component Value Date   CHOL 162 08/02/2023   HDL 42 08/02/2023   LDLCALC 101 (H) 08/02/2023   LDLDIRECT 87.0 06/16/2023   TRIG 94 08/02/2023   CHOLHDL 3.9 08/02/2023   Lab Results  Component Value Date   HGBA1C 5.9 (H) 08/02/2023   No results found for: "VITAMINB12" Lab Results  Component Value Date   TSH 2.03 04/10/2015    MRI Brain 04/22/2023 1. Multiple small foci of acute ischemia along the left precentral and postcentral gyri. No hemorrhage or mass effect. 2. 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  MRI Brain 08/02/2023 Multiple small acute infarcts in the left frontal and parietal lobes.  CTA Head and Neck 04/21/2023 1. No large vessel occlusion,  hemodynamically significant stenosis, dissection, or aneurysm in the head or neck vessels. 2. Multilevel cervical spondylosis, worst at C5-6 where there is at least moderate spinal canal  stenosis.  CTA Head and Neck 08/01/2023 1. Suspect small acute infarct in the left frontal lobe. ASPECTS is 9. 2. Patent vasculature of the head and neck with mild plaque at the carotid bifurcations but no hemodynamically significant stenosis, occlusion, or dissection.   ASSESSMENT AND PLAN  78 y.o. year old male with past medical history of hypertension, hyperlipidemia who is presenting after a second left hemispheric strokes on August 01, 2023.  Stroke etiology likely cardioembolic since he was found to have paroxysmal atrial fibrillation.  He was recommended Eliquis, but patient refused to take it.  I have spent additional amount of time explained to the patient that because of the stroke due to atrial fibrillation, his treatment should be with anticoagulation, Eliquis, or any other anticoagulation.  He voices understanding, tells me that he believes that the Eliquis triggered his supraventricular tachycardia and is not willing to take it.  He will however continue the Plavix while understanding his risk of stroke are not controlled.  I will see him in 6 months for follow-up but he understands to go to the hospital if he does have any focal neurological deficit.   1. Cerebrovascular accident (CVA) due to occlusion of left middle cerebral artery [facility])     Patient Instructions  Continue current medications  Continue to follow up with your doctors  Call for any questions  Return in 6 months or sooner if worse   No orders of the defined types were placed in this encounter.   No orders of the defined types were placed in this encounter.   Return in about 6 months (around 08/12/2024).  I have spent a total of 50 minutes dedicated to this patient today, preparing to see patient, performing a medically appropriate examination and evaluation, ordering tests and/or medications and procedures, and counseling and educating the patient/family/caregiver; independently interpreting result and  communicating results to the family/patient/caregiver; and documenting clinical information in the electronic medical record.   Windell Norfolk, MD 02/10/2024, 4:58 PM  [facility] [address] [place], [place] [address] [phone]

## 2024-02-20 ENCOUNTER — Encounter: Payer: Medicare Other | Admitting: Physical Medicine & Rehabilitation

## 2024-03-29 ENCOUNTER — Ambulatory Visit: Payer: Medicare Other

## 2024-03-29 DIAGNOSIS — H35371 Puckering of macula, right eye: Secondary | ICD-10-CM | POA: Diagnosis not present

## 2024-04-29 ENCOUNTER — Ambulatory Visit: Payer: Self-pay | Admitting: Family Medicine

## 2024-04-29 ENCOUNTER — Encounter: Payer: Self-pay | Admitting: Family Medicine

## 2024-04-29 ENCOUNTER — Ambulatory Visit (INDEPENDENT_AMBULATORY_CARE_PROVIDER_SITE_OTHER): Payer: Medicare Other | Admitting: Family Medicine

## 2024-04-29 VITALS — BP 154/88 | HR 92 | Temp 98.0°F | Ht 66.0 in | Wt 170.0 lb

## 2024-04-29 DIAGNOSIS — I2510 Atherosclerotic heart disease of native coronary artery without angina pectoris: Secondary | ICD-10-CM | POA: Diagnosis not present

## 2024-04-29 DIAGNOSIS — R7303 Prediabetes: Secondary | ICD-10-CM | POA: Diagnosis not present

## 2024-04-29 DIAGNOSIS — I10 Essential (primary) hypertension: Secondary | ICD-10-CM

## 2024-04-29 DIAGNOSIS — Z131 Encounter for screening for diabetes mellitus: Secondary | ICD-10-CM | POA: Diagnosis not present

## 2024-04-29 DIAGNOSIS — R739 Hyperglycemia, unspecified: Secondary | ICD-10-CM

## 2024-04-29 DIAGNOSIS — G720 Drug-induced myopathy: Secondary | ICD-10-CM

## 2024-04-29 DIAGNOSIS — E785 Hyperlipidemia, unspecified: Secondary | ICD-10-CM | POA: Diagnosis not present

## 2024-04-29 LAB — CBC WITH DIFFERENTIAL/PLATELET
Basophils Absolute: 0 10*3/uL (ref 0.0–0.1)
Basophils Relative: 0.7 % (ref 0.0–3.0)
Eosinophils Absolute: 0.9 10*3/uL — ABNORMAL HIGH (ref 0.0–0.7)
Eosinophils Relative: 12.9 % — ABNORMAL HIGH (ref 0.0–5.0)
HCT: 43.8 % (ref 39.0–52.0)
Hemoglobin: 15 g/dL (ref 13.0–17.0)
Lymphocytes Relative: 23.3 % (ref 12.0–46.0)
Lymphs Abs: 1.6 10*3/uL (ref 0.7–4.0)
MCHC: 34.3 g/dL (ref 30.0–36.0)
MCV: 90 fl (ref 78.0–100.0)
Monocytes Absolute: 0.6 10*3/uL (ref 0.1–1.0)
Monocytes Relative: 8.1 % (ref 3.0–12.0)
Neutro Abs: 3.8 10*3/uL (ref 1.4–7.7)
Neutrophils Relative %: 55 % (ref 43.0–77.0)
Platelets: 258 10*3/uL (ref 150.0–400.0)
RBC: 4.86 Mil/uL (ref 4.22–5.81)
RDW: 13.1 % (ref 11.5–15.5)
WBC: 6.9 10*3/uL (ref 4.0–10.5)

## 2024-04-29 LAB — LIPID PANEL
Cholesterol: 229 mg/dL — ABNORMAL HIGH (ref 0–200)
HDL: 55.7 mg/dL (ref >39.00)
LDL Cholesterol: 152 mg/dL — ABNORMAL HIGH (ref 0–99)
NonHDL: 172.8
Total CHOL/HDL Ratio: 4
Triglycerides: 106 mg/dL (ref 0.0–149.0)
VLDL: 21.2 mg/dL (ref 0.0–40.0)

## 2024-04-29 LAB — COMPREHENSIVE METABOLIC PANEL WITH GFR
ALT: 17 U/L (ref 0–53)
AST: 18 U/L (ref 0–37)
Albumin: 4.4 g/dL (ref 3.5–5.2)
Alkaline Phosphatase: 38 U/L — ABNORMAL LOW (ref 39–117)
BUN: 13 mg/dL (ref 6–23)
CO2: 30 meq/L (ref 19–32)
Calcium: 10 mg/dL (ref 8.4–10.5)
Chloride: 100 meq/L (ref 96–112)
Creatinine, Ser: 1.15 mg/dL (ref 0.40–1.50)
GFR: 62.25 mL/min (ref >60.00)
Glucose, Bld: 105 mg/dL — ABNORMAL HIGH (ref 70–99)
Potassium: 4 meq/L (ref 3.5–5.1)
Sodium: 137 meq/L (ref 135–145)
Total Bilirubin: 0.7 mg/dL (ref 0.2–1.2)
Total Protein: 7.4 g/dL (ref 6.0–8.3)

## 2024-04-29 LAB — HEMOGLOBIN A1C: Hgb A1c MFr Bld: 6 % (ref 4.6–6.5)

## 2024-04-29 NOTE — Progress Notes (Signed)
 Phone [phone] In person visit   Subjective:   Ethan Payne is a 78 y.o. year old very pleasant male patient who presents for/with See problem oriented charting Chief Complaint  Patient presents with   Medical Management of Chronic Issues    Wants to discuss plavix  dosing.   Hypertension   Hyperlipidemia    Past Medical History-  Patient Active Problem List   Diagnosis Date Noted   SVT (supraventricular tachycardia) (HCC) 10/08/2023    Priority: High   Paroxysmal atrial fibrillation (HCC) 12/21/2021    Priority: High   CAD (coronary artery disease) 02/17/2015    Priority: High   s/p excision of Atrial myxoma 02/04/2008    Priority: High   BPH associated with nocturia 04/10/2015    Priority: Medium    Hyperlipidemia 02/04/2008    Priority: Medium    Essential hypertension 02/04/2008    Priority: Medium    Leg length difference, acquired 01/07/2018    Priority: Low   Rotator cuff syndrome of right shoulder 12/08/2017    Priority: Low   Health care maintenance 02/18/2015    Priority: Low   Erectile dysfunction 02/18/2015    Priority: Low   Heel pain 03/22/2011    Priority: Low   GERD (gastroesophageal reflux disease) 03/22/2011    Priority: Low   Osteoarthritis of right knee 04/18/2010    Priority: Low    Medications- reviewed and updated Current Outpatient Medications  Medication Sig Dispense Refill   calcium  carbonate (TUMS - DOSED IN MG ELEMENTAL CALCIUM ) 500 MG chewable tablet Chew 1,000 mg by mouth in the morning and at bedtime.     clopidogrel  (PLAVIX ) 75 MG tablet Take 1 tablet (75 mg total) by mouth daily. 90 tablet 3   ezetimibe  (ZETIA ) 10 MG tablet Take 1 tablet (10 mg total) by mouth daily. 60 tablet 0   lisinopril  (ZESTRIL ) 20 MG tablet Take 1 tablet (20 mg total) by mouth daily. 90 tablet 3   carvedilol  (COREG ) 3.125 MG tablet Take 1 tablet (3.125 mg total) by mouth 2 (two) times daily with a meal. (Patient not taking: Reported on 04/29/2024) 60  tablet 3   No current facility-administered medications for this visit.     Objective:  BP (!) 154/88   Pulse 92   Temp 98 F (36.7 C)   Ht 5\' 6"  (1.676 m)   Wt 170 lb (77.1 kg)   SpO2 97%   BMI 27.44 kg/m  Gen: NAD, resting comfortably CV: RRR no murmurs rubs or gallops Lungs: CTAB no crackles, wheeze, rhonchi Ext: no edema Skin: warm, dry     Assessment and Plan   #social update- working around his house a lot- not doing his primary job anymore  #some fatigue- no chest pain or shortness of breath with the fatigue thankfully. Want to update labs we ordered 12/29/23. Occasional palpitaitons but reports had for years. Gets some heart rate up to 100 and discussed again this could be a fib- but wants to stay off eliquis   #Right knee pain- has had injections with Dr. Sharl Davies in the past- plans to schedule follow up . Did well with AC joint injection ultrasound guided  #history of stroke may 2024- clopidogrel  75 mg added for 21 days- right upper extremity weakness and mild right facial droop-cardiology and neurology have recommended  eliquis  with risk of a fib with recurrent stroke August 2024. He also asked for loop recorder to be removed. He is remaining on Plavix  as believes eliquis  caused  his SVT. Still with some palpitations. Hasd fatigue and dizziness on metoprolol  but has coreg  available if needed -did have prior a fib with atrial myxoma removal  #CAD- 2 vessel bypass done primarily due to already having open heart for atrial myxoma removal #hyperlipidemia- statin myalgia g72.0 S: Medication: in the past was on  rosuvastatin  20 mg (statin myalgia  issues in past and came off) , Plavix  75 mg -is taking zetia  10 mg- missed a few weeks Lab Results  Component Value Date   CHOL 162 08/02/2023   HDL 42 08/02/2023   LDLCALC 101 (H) 08/02/2023   LDLDIRECT 87.0 06/16/2023   TRIG 94 08/02/2023   CHOLHDL 3.9 08/02/2023   A/P: update lipids but suspect may be high as missed some  Zetia - now more consistent- hed prefer to focus on consistency over adding rosuvastatin  or similar lower dose due to prior myalgias  #hypertension S: medication:  lisinopril  20 mg- only takes half, carvedilol  3.125 mg twice daily available but only takes for elevated blood pressure  -stopped amlodipine  after starting tamsulosin due to low blood pressure  Home readings: 137/87 pretty typical for him BP Readings from Last 3 Encounters:  04/29/24 (!) 154/88  02/10/24 138/88  12/29/23 130/80  A/P: blood pressure above ideal goal today <135/85 and slightly above this at home- recommended he take full lisinopril  20 mg and update me in 2 weeks with home readings. We could add the carvedilol  in more consistently if blood pressure not at goal and not having lightheadedness (one reason he has reduced medications in past)   # Hyperglycemia/insulin resistance/prediabetes- peak a1c of 6.02 Apr 2023 S:  Medication: none  Lab Results  Component Value Date   HGBA1C 5.9 (H) 08/02/2023   HGBA1C 6.1 (H) 04/21/2023   HGBA1C  10/20/2009    5.7 (NOTE) The ADA recommends the following therapeutic goal for glycemic control related to Hgb A1c measurement: Goal of therapy: <6.5 Hgb A1c  Reference: American Diabetes Association: Clinical Practice Recommendations 2010, Diabetes Care, 2010, 33: (Suppl  1).   A/P: hopefully stable- update a1c today. Continue without meds for now   Recommended follow up: Return in about 3 months (around 07/30/2024) for followup or sooner if needed.Schedule b4 you leave. Future Appointments  Date Time Provider Department Center  08/16/2024  3:45 PM Cassandra Cleveland, MD [facility] None   Lab/Order associations: fasting   ICD-10-CM   1. Essential hypertension  I10     2. Coronary artery disease involving native coronary artery of native heart without angina pectoris  I25.10     3. Hyperlipidemia, unspecified hyperlipidemia type  E78.5     4. Prediabetes  R73.03     5. Drug-induced  myopathy  G72.0       No orders of the defined types were placed in this encounter.   Return precautions advised.  Clarisa Crooked, MD

## 2024-04-29 NOTE — Patient Instructions (Addendum)
 Schedule follow up with Dr. Sharl Davies  Please stop by lab before you go If you have mychart- we will send your results within 3 business days of us  receiving them.  If you do not have mychart- we will call you about results within 5 business days of us  receiving them.  *please also note that you will see labs on mychart as soon as they post. I will later go in and write notes on them- will say "notes from Dr. Arlene Ben"   blood pressure above ideal goal today <135/85 and slightly above this at home- recommended he take full lisinopril  20 mg and update me in 2 weeks with home readings. We could add the carvedilol  in more consistently if blood pressure not at goal and not having lightheadedness (one reason he has reduced medications in past)   Recommended follow up: Return in about 3 months (around 07/30/2024) for followup or sooner if needed.Schedule b4 you leave.

## 2024-04-30 ENCOUNTER — Other Ambulatory Visit: Payer: Self-pay

## 2024-04-30 MED ORDER — EZETIMIBE 10 MG PO TABS: 10.0000 mg | ORAL_TABLET | Freq: Every day | ORAL | 3 refills | Status: AC

## 2024-04-30 MED ORDER — ROSUVASTATIN CALCIUM 10 MG PO TABS: 10.0000 mg | ORAL_TABLET | Freq: Every day | ORAL | 3 refills | Status: AC

## 2024-05-05 ENCOUNTER — Other Ambulatory Visit: Payer: Self-pay | Admitting: Family Medicine

## 2024-05-20 DIAGNOSIS — R233 Spontaneous ecchymoses: Secondary | ICD-10-CM | POA: Diagnosis not present

## 2024-05-20 DIAGNOSIS — C44319 Basal cell carcinoma of skin of other parts of face: Secondary | ICD-10-CM | POA: Diagnosis not present

## 2024-05-20 DIAGNOSIS — D492 Neoplasm of unspecified behavior of bone, soft tissue, and skin: Secondary | ICD-10-CM | POA: Diagnosis not present

## 2024-05-20 DIAGNOSIS — L821 Other seborrheic keratosis: Secondary | ICD-10-CM | POA: Diagnosis not present

## 2024-05-20 DIAGNOSIS — C44222 Squamous cell carcinoma of skin of right ear and external auricular canal: Secondary | ICD-10-CM | POA: Diagnosis not present

## 2024-06-01 ENCOUNTER — Other Ambulatory Visit: Payer: Self-pay | Admitting: Physical Medicine & Rehabilitation

## 2024-06-01 DIAGNOSIS — K080 Exfoliation of teeth due to systemic causes: Secondary | ICD-10-CM | POA: Diagnosis not present

## 2024-06-02 DIAGNOSIS — K080 Exfoliation of teeth due to systemic causes: Secondary | ICD-10-CM | POA: Diagnosis not present

## 2024-06-23 DIAGNOSIS — K080 Exfoliation of teeth due to systemic causes: Secondary | ICD-10-CM | POA: Diagnosis not present

## 2024-06-25 ENCOUNTER — Other Ambulatory Visit: Payer: Self-pay | Admitting: Family Medicine

## 2024-06-25 NOTE — Telephone Encounter (Signed)
 He had muscle aches previously and stated he wanted to stop the medication-I think would be fantastic if he wants to retry it-please check with him to see if he is the one requesting it

## 2024-06-25 NOTE — Telephone Encounter (Signed)
 Please review request as it states medication has been discontinued.

## 2024-06-28 NOTE — Telephone Encounter (Signed)
 Called and spoke with patient regarding crestor . Patient does not want to refill this and requested us  to let [facility] know.

## 2024-07-01 DIAGNOSIS — D225 Melanocytic nevi of trunk: Secondary | ICD-10-CM | POA: Diagnosis not present

## 2024-07-01 DIAGNOSIS — L570 Actinic keratosis: Secondary | ICD-10-CM | POA: Diagnosis not present

## 2024-07-01 DIAGNOSIS — L814 Other melanin hyperpigmentation: Secondary | ICD-10-CM | POA: Diagnosis not present

## 2024-07-01 DIAGNOSIS — L821 Other seborrheic keratosis: Secondary | ICD-10-CM | POA: Diagnosis not present

## 2024-07-01 DIAGNOSIS — D492 Neoplasm of unspecified behavior of bone, soft tissue, and skin: Secondary | ICD-10-CM | POA: Diagnosis not present

## 2024-07-21 ENCOUNTER — Ambulatory Visit (INDEPENDENT_AMBULATORY_CARE_PROVIDER_SITE_OTHER)

## 2024-07-21 VITALS — Ht 69.5 in | Wt 170.0 lb

## 2024-07-21 DIAGNOSIS — Z0000 Encounter for general adult medical examination without abnormal findings: Secondary | ICD-10-CM | POA: Diagnosis not present

## 2024-07-21 DIAGNOSIS — Z1211 Encounter for screening for malignant neoplasm of colon: Secondary | ICD-10-CM

## 2024-07-21 NOTE — Patient Instructions (Signed)
 Ethan Payne , Thank you for taking time out of your busy schedule to complete your Annual Wellness Visit with me. I enjoyed our conversation and look forward to speaking with you again next year. I, as well as your care team,  appreciate your ongoing commitment to your health goals. Please review the following plan we discussed and let me know if I can assist you in the future. Your Game plan/ To Do List    Referrals: If you haven't heard from the office you've been referred to, please reach out to them at the phone provided.   Follow up Visits: We will see or speak with you next year for your Next Medicare AWV with our clinical staff Have you seen your provider in the last 6 months (3 months if uncontrolled diabetes)? Yes  Clinician Recommendations:  Aim for 30 minutes of exercise or brisk walking, 6-8 glasses of water, and 5 servings of fruits and vegetables each day.       This is a list of the screenings recommended for you:  Health Maintenance  Topic Date Due   Medicare Annual Wellness Visit  03/23/2024   Cologuard (Stool DNA test)  05/23/2024   Flu Shot  07/02/2024   DTaP/Tdap/Td vaccine (3 - Td or Tdap) 08/01/2024   Hepatitis C Screening  Completed   HPV Vaccine  Aged Out   Meningitis B Vaccine  Aged Out   Pneumococcal Vaccine for age over 47  Discontinued   COVID-19 Vaccine  Discontinued   Zoster (Shingles) Vaccine  Discontinued    Advanced directives: (Declined) Advance directive discussed with you today. Even though you declined this today, please call our office should you change your mind, and we can give you the proper paperwork for you to fill out. Advance Care Planning is important because it:  [x]  Makes sure you receive the medical care that is consistent with your values, goals, and preferences  [x]  It provides guidance to your family and loved ones and reduces their decisional burden about whether or not they are making the right decisions based on your  wishes.  Follow the link provided in your after visit summary or read over the paperwork we have mailed to you to help you started getting your Advance Directives in place. If you need assistance in completing these, please reach out to us  so that we can help you!  See attachments for Preventive Care and Fall Prevention Tips.

## 2024-07-21 NOTE — Progress Notes (Signed)
 Subjective:   Ethan Payne is a 78 y.o. who presents for a Medicare Wellness preventive visit.  As a reminder, Annual Wellness Visits don't include a physical exam, and some assessments may be limited, especially if this visit is performed virtually. We may recommend an in-person follow-up visit with your provider if needed.  Visit Complete: Virtual I connected with  Ethan Payne on 07/21/24 by a audio enabled telemedicine application and verified that I am speaking with the correct person using two identifiers.  Patient Location: Home  Provider Location: Home Office  I discussed the limitations of evaluation and management by telemedicine. The patient expressed understanding and agreed to proceed.  Vital Signs: Because this visit was a virtual/telehealth visit, some criteria may be missing or patient reported. Any vitals not documented were not able to be obtained and vitals that have been documented are patient reported.  VideoDeclined- This patient declined Librarian, academic. Therefore the visit was completed with audio only.  Persons Participating in Visit: Patient.  AWV Questionnaire: No: Patient Medicare AWV questionnaire was not completed prior to this visit.  Cardiac Risk Factors include: advanced age (>80men, >53 women);hypertension;dyslipidemia;male gender     Objective:    Today's Vitals   07/21/24 1000  Weight: 170 lb (77.1 kg)  Height: 5' 9.5 (1.765 m)   Body mass index is 24.74 kg/m.     07/21/2024   10:04 AM 10/02/2023    4:24 PM 04/24/2023    3:46 PM 04/23/2023   10:18 AM 04/21/2023    6:34 PM 03/24/2023    1:54 PM 01/22/2022    8:09 AM  Advanced Directives  Does Patient Have a Medical Advance Directive? No No No  No No No  Would patient like information on creating a medical advance directive? No - Patient declined  Yes (Inpatient - patient requests chaplain consult to create a medical advance directive) No - Patient  declined  No - Patient declined Yes (MAU/Ambulatory/Procedural Areas - Information given)    Current Medications (verified) Outpatient Encounter Medications as of 07/21/2024  Medication Sig   calcium  carbonate (TUMS - DOSED IN MG ELEMENTAL CALCIUM ) 500 MG chewable tablet Chew 1,000 mg by mouth in the morning and at bedtime.   carvedilol  (COREG ) 3.125 MG tablet TAKE 1 TABLET(3.125 MG) BY MOUTH TWICE DAILY WITH A MEAL   clopidogrel  (PLAVIX ) 75 MG tablet Take 1 tablet (75 mg total) by mouth daily.   ezetimibe  (ZETIA ) 10 MG tablet Take 1 tablet (10 mg total) by mouth daily.   lisinopril  (ZESTRIL ) 20 MG tablet TAKE 1 TABLET(20 MG) BY MOUTH DAILY   rosuvastatin  (CRESTOR ) 10 MG tablet Take 1 tablet (10 mg total) by mouth daily. (Patient not taking: Reported on 07/21/2024)   No facility-administered encounter medications on file as of 07/21/2024.    Allergies (verified) Hydrocodone  and Loratadine   History: Past Medical History:  Diagnosis Date   Acute prostatitis 02/04/2008   ATRIAL MYXOMA 02/04/2008   Closed fracture of four ribs 11/01/2009   HYPERLIPIDEMIA 02/04/2008   HYPERTENSION 02/04/2008   Osteoarthritis of right knee 04/18/2010   Past Surgical History:  Procedure Laterality Date   CATARACT EXTRACTION Left 08/03/2019   CORONARY ARTERY BYPASS GRAFT  2007   x2 Saph vein   COX-MAZE MICROWAVE ABLATION  2007   EXCISION OF ATRIAL MYXOMA  2007   Dr C. Owen   LOOP RECORDER INSERTION N/A 08/05/2023   Procedure: LOOP RECORDER INSERTION;  Surgeon: Nancey Eulas BRAVO, MD;  Location: [facility];  Service: Cardiovascular;  Laterality: N/A;   Family History  Problem Relation Age of Onset   Stroke Mother    Heart disease Father        MI 81   Hypertension Father    Ovarian cancer Sister    Social History   Socioeconomic History   Marital status: Married    Spouse name: Not on file   Number of children: Not on file   Years of education: Not on file   Highest education level: Not on  file  Occupational History   Occupation: married  Tobacco Use   Smoking status: Never   Smokeless tobacco: Never  Substance and Sexual Activity   Alcohol use: No   Drug use: No   Sexual activity: Yes  Other Topics Concern   Not on file  Social History Narrative   Married will be 50 years in 2019 (wife seen at another practice). 2 children-daughter unmarried, professional student 73 and son 57 in 2016. 2 grandsons-17 and 39 in 2019.       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, does his own carpentry work, still working around [employer]: fishing   Social Drivers of [employer]: Low Risk  (07/21/2024)   Overall Physicist, medical Strain (CARDIA)    Difficulty of Paying Living Expenses: Not hard at all  Food Insecurity: No Food Insecurity (07/21/2024)   Hunger Vital Sign    Worried About Running Out of Food in the Last Year: Never true    Ran Out of Food in the Last Year: Never true  Transportation Needs: No Transportation Needs (07/21/2024)   PRAPARE - Administrator, Civil Service (Medical): No    Lack of Transportation (Non-Medical): No  Physical Activity: Sufficiently Active (07/21/2024)   Exercise Vital Sign    Days of Exercise per Week: 7 days    Minutes of Exercise per Session: 30 min  Stress: No Stress Concern Present (07/21/2024)   [employer] of Occupational Health - Occupational Stress Questionnaire    Feeling of Stress: Not at all  Social Connections: Moderately Isolated (07/21/2024)   Social Connection and Isolation Panel    Frequency of Communication with Friends and Family: More than three times a week    Frequency of Social Gatherings with Friends and Family: More than three times a week    Attends Religious Services: Never    Database administrator or Organizations: No    Attends Engineer, structural: Never    Marital Status: Married    Tobacco Counseling Counseling given: Not Answered    Clinical  Intake:  Pre-visit preparation completed: Yes  Pain : No/denies pain     BMI - recorded: 24.74 Nutritional Status: BMI of 19-24  Normal Nutritional Risks: None Diabetes: No  Lab Results  Component Value Date   HGBA1C 6.0 04/29/2024   HGBA1C 5.9 (H) 08/02/2023   HGBA1C 6.1 (H) 04/21/2023     How often do you need to have someone help you when you read instructions, pamphlets, or other written materials from your doctor or pharmacy?: 1 - Never  Interpreter Needed?: No  Information entered by :: Ellouise Haws, LPN   Activities of Daily Living     07/21/2024   10:02 AM  In your present state of health, do you have any difficulty performing the following activities:  Hearing? 1  Comment hearing aids  Vision? 0  Difficulty concentrating or making decisions? 0  Walking or climbing stairs? 0  Dressing or bathing? 0  Doing errands, shopping? 0  Preparing Food and eating ? N  Using the Toilet? N  In the past six months, have you accidently leaked urine? N  Do you have problems with loss of bowel control? N  Managing your Medications? N  Managing your Finances? N  Housekeeping or managing your Housekeeping? N    Patient Care Team: Katrinka Garnette KIDD, MD as PCP - General (Family Medicine) Waddell Danelle ORN, MD as PCP - Cardiology (Cardiology) Sheldon Standing, MD as Consulting Physician (General Surgery) Carolee Sherwood JONETTA DOUGLAS, MD as Consulting Physician (Urology)   I have updated your Care Teams any recent Medical Services you may have received from other providers in the past year.     Assessment:   This is a routine wellness examination for Ethan Payne.  Hearing/Vision screen Hearing Screening - Comments:: Pt denies any hearing issues  Vision Screening - Comments:: Wears rx glasses - up to date with routine eye exams with Dr Loreli    Goals Addressed             This Visit's Progress    Patient Stated       Maintain health and activity       Depression Screen       07/21/2024   10:04 AM 12/29/2023   11:01 AM 10/08/2023   12:01 PM 08/12/2023   10:23 AM 06/16/2023   10:30 AM 06/10/2023   11:37 AM 05/13/2023    9:41 AM  PHQ 2/9 Scores  PHQ - 2 Score 0 0 0 0 0 0 0  PHQ- 9 Score 0 2 1 0 0  0    Fall Risk      07/21/2024   10:06 AM 12/29/2023   11:01 AM 12/23/2023    3:20 PM 10/09/2023   11:38 AM 10/08/2023   12:01 PM  Fall Risk   Falls in the past year? 0 0 0 0 0  Number falls in past yr: 0 0   0  Injury with Fall? 0 0   0  Risk for fall due to : No Fall Risks No Fall Risks   No Fall Risks  Follow up Falls prevention discussed Falls evaluation completed   Falls evaluation completed    MEDICARE RISK AT HOME:   Medicare Risk at Home Any stairs in or around the home?: Yes If so, are there any without handrails?: No Home free of loose throw rugs in walkways, pet beds, electrical cords, etc?: Yes Adequate lighting in your home to reduce risk of falls?: Yes Life alert?: No Use of a cane, walker or w/c?: No Grab bars in the bathroom?: No Shower chair or bench in shower?: No Elevated toilet seat or a handicapped toilet?: No  TIMED UP AND GO:  Was the test performed?  No  Cognitive Function: 6CIT completed        07/21/2024   10:06 AM 03/24/2023    1:59 PM 10/06/2019    3:38 PM  6CIT Screen  What Year? 0 points 0 points 0 points  What month? 0 points 0 points 0 points  What time? 0 points 0 points 0 points  Count back from 20 0 points 0 points 0 points  Months in reverse 0 points 0 points 0 points  Repeat phrase 0 points 0 points 0 points  Total Score 0 points 0 points 0 points    Immunizations  Immunization History  Administered Date(s) Administered   Influenza Whole 09/01/2009   Influenza,inj,Quad PF,6+ Mos 01/14/2014   Pneumococcal Polysaccharide-23 10/03/2009   Td 05/05/2009   Tdap 08/01/2014    Screening Tests Health Maintenance  Topic Date Due   Fecal DNA (Cologuard)  05/23/2024   INFLUENZA VACCINE  07/02/2024    DTaP/Tdap/Td (3 - Td or Tdap) 08/01/2024   Medicare Annual Wellness (AWV)  07/21/2025   Hepatitis C Screening  Completed   HPV VACCINES  Aged Out   Meningococcal B Vaccine  Aged Out   Pneumococcal Vaccine: 50+ Years  Discontinued   COVID-19 Vaccine  Discontinued   Zoster Vaccines- Shingrix  Discontinued    Health Maintenance  Health Maintenance Due  Topic Date Due   Fecal DNA (Cologuard)  05/23/2024   INFLUENZA VACCINE  07/02/2024   Health Maintenance Items Addressed: See Nurse Notes at the end of this note  Additional Screening:  Vision Screening: Recommended annual ophthalmology exams for early detection of glaucoma and other disorders of the eye. Would you like a referral to an eye doctor? No    Dental Screening: Recommended annual dental exams for proper oral hygiene  Community Resource Referral / Chronic Care Management: CRR required this visit?  No   CCM required this visit?  No   Plan:    I have personally reviewed and noted the following in the patient's chart:   Medical and social history Use of alcohol, tobacco or illicit drugs  Current medications and supplements including opioid prescriptions. Patient is not currently taking opioid prescriptions. Functional ability and status Nutritional status Physical activity Advanced directives List of other physicians Hospitalizations, surgeries, and ER visits in previous 12 months Vitals Screenings to include cognitive, depression, and falls Referrals and appointments  In addition, I have reviewed and discussed with patient certain preventive protocols, quality metrics, and best practice recommendations. A written personalized care plan for preventive services as well as general preventive health recommendations were provided to patient.   Ellouise VEAR Haws, LPN   [id number]/[id number]   After Visit Summary: (MyChart) Due to this being a telephonic visit, the after visit summary with patients personalized plan was offered  to patient via MyChart   Notes: Nothing significant to report at this time.

## 2024-08-05 DIAGNOSIS — Z1211 Encounter for screening for malignant neoplasm of colon: Secondary | ICD-10-CM | POA: Diagnosis not present

## 2024-08-12 ENCOUNTER — Ambulatory Visit: Payer: Self-pay | Admitting: Family Medicine

## 2024-08-12 LAB — COLOGUARD: COLOGUARD: NEGATIVE

## 2024-08-16 ENCOUNTER — Ambulatory Visit: Admitting: Neurology

## 2024-08-16 ENCOUNTER — Encounter: Payer: Self-pay | Admitting: Neurology

## 2024-08-16 VITALS — BP 146/81 | HR 98 | Ht 69.5 in | Wt 164.0 lb

## 2024-08-16 DIAGNOSIS — I63512 Cerebral infarction due to unspecified occlusion or stenosis of left middle cerebral artery: Secondary | ICD-10-CM | POA: Diagnosis not present

## 2024-08-16 NOTE — Patient Instructions (Addendum)
 Continue current medications  Continue to follow up with your doctors  Please contact me if you worsening neck pain, bilateral arms numbness, tingling or shooting pain. Return as needed or any other concerns.

## 2024-08-16 NOTE — Progress Notes (Signed)
 [facility]  PATIENT: Ethan Payne DOB: 1946/06/07  REQUESTING CLINICIAN: Katrinka Garnette KIDD, MD HISTORY FROM: Patient/Chart review  REASON FOR VISIT: Left hemispheric stroke    HISTORICAL  CHIEF COMPLAINT:  Chief Complaint  Patient presents with   Rm 12    Patient is here alone for CVA follow-up. He feels he is doing well. Denies falls since last visit.   INTERVAL HISTORY 08/16/2024 Patient presents today for follow-up, last visit was in March, since then he has been doing well, denies any strokelike symptoms.  He is compliant with his medications including lisinopril , Zetia  and Plavix .  Again he did not want to take the Eliquis  but is compliant with his Plavix  75 mg daily, denies any new stroke symptoms.  No other complaint or concerns.   INTERVAL HISTORY 02/10/2024:  Patient presents today for follow-up, last visit was in August 2024.  In September 2024 he was admitted to hospital, found to have new left hemispheric strokes.  Stroke etiology likely cardioembolic but cannot rule out large vessel disease.  Patient was restarted on DAPT for 3 months and had an implanted loop recorder.  He tells me that he wore the loop recorder for about 7 weeks but it was causing extreme pain therefore the loop recorder was removed but patient agreed to taking Eliquis .  He reports 8 days while being on Eliquis , he had an episode of SVT, taken to the hospital diagnosed with atrial fibrillation.  Patient was strongly recommended anticoagulation but he refused and remained on the Plavix .  He reports compliance with medications, has not had any additional events.   HISTORY OF PRESENT ILLNESS:  This is a 78 year old gentleman past medical history hypertension, hyperlipidemia who presented to the hospital in May 20 with acute right-sided weakness.  CT head did not show any acute abnormality, CT angiogram did not show any large vessel occlusion.  He was given TNK, tolerated the procedure  well.  His follow-up MRI showed multiple foci of acute strokes along the left precentral and postcentral gyri.  He did well in terms of recovery, prior to hospitalization, he was not on aspirin  and during admission he was started on DAPT, aspirin  and Plavix  for 21-days.  He completed that regimen and currently on aspirin  alone.  He has also completed physical therapy.  Currently patient reports that he is back to his normal self, no deficits in terms of the right hand weakness.  He is compliant with his medications including Crestor  20 mg, stated that he cannot take 40 mg due to muscle cramps.  He is also on amlodipine  and Lisinopril  for blood pressure.  Currently he does not have any concerns, no deficit.     Hospital course and summary  Brief HPI:   Ethan Payne is a 78 y.o. male  who presented to the [facility] ED on 04/21/2023 with acute right-sided weakness. Code stroke initiated. Head CT showed no acute process. Evaluated by Ethan Payne and risks, benefits, and alternatives to TNK were discussed with patient who gave informed consent to proceed after further discussion with his adult son. CTA showed no LVO therefore no intervention indicated. TNK administered at 18:23 hours. Admitted to ICU. On 5/21, right weakness had improved, right facial droop present, some ataxia continued in right arm, weaker grip right hand, right hand bilateral sensation intact. Blood pressure adequately controlled. 2D Echo: EF 55 to 60%, mild concentric LVH, trivial MVR, trivial AVR, no evidence of left atrial mass, no shunt. LDL 219,  HgbA1c 6.1%. VTE prophylaxis - SCDs. On 5/22, the patient has reported right upper extremity proximal weakness due to rotator cuff tear and right leg weakness due to a bad knee. Right grip remains weaker, mild right facial droop persists. MRI brain with multiple small foci of acute ischemia along the left precentral and postcentral gyri. No hemorrhage or mass effect. Old bilateral cerebellar small  vessel infarcts and findings of chronic follows all ischemia. Started on aspirin  and Plavix  for 21 days followed by aspirin  alone. Patient reports statin intolerance and is refusing further statin therapy Discussed Leqvio injections with patient pending insurance authorization. May need outpatient follow up with lipid clinic in the setting of hyperlipidemia and statin intolerance per Ethan Payne. The patient has impairment mobility needing up to mod assist for ambulation due to right LE deficits. He fatigues quickly, has poor safety awareness and appears to have some right inattention as well. He is tolerating regular diet. Norvasc  and Zestril  continue.      Hospital Course: Ethan Payne was admitted to rehab 04/24/2023 for inpatient therapies to consist of PT, ST and OT at least three hours five days a week. Past admission physiatrist, therapy team and rehab RN have worked together to provide customized collaborative inpatient rehab. 5/24: Follow-up labs with small increase in serum creatinine; BUN normal. He developed return of RUE weakness and facial droop and code stroke called. CT head without bleed. Likely worsening of recent deficit in setting of mild dehydration physical exertion. Given IVFs and rest from therapy. Encouraged PO fluids. VS and NIHSS continued x 2 hours. Due to transient right arm numbness and weakness, positive Jobe's test and history of rotator cuff tear, MRI of right shoulder obtained. Will consider shoulder injection and/or EMG at follow-up with PM&R. Cervical MRI has forminal stenosis C4-5 and C5-6. Given Voltaren  gel to try.    Blood pressures were monitored on TID basis and amlodipine  5 mg daily lisinopril  20 mg daily were continued. Reduced lisinopril  to 10 mg. Reduced amlodipine  to 2.5 mg for one day then increased back to 5 mg at discharge.   Prediabetes has been monitored with ac/hs CBG checks and carb modified diet.   Rehab course: During patient's stay in rehab weekly  team conferences were held to monitor patient's progress, set goals and discuss barriers to discharge. At admission, patient was  independent  with functional mobility and with basic self-care skills.   He has had improvement in activity tolerance, balance, postural control as well as ability to compensate for deficits. He has had improvement in functional use RUE as well as improvement in awareness. He will follow-up with outpatient [facility] at [facility]  OTHER MEDICAL CONDITIONS: Hypertension, Hyperlipidemia    REVIEW OF SYSTEMS: Full 14 system review of systems performed and negative with exception of: As noted in the HPI   ALLERGIES: Allergies  Allergen Reactions   Hydrocodone  Palpitations    After rib fractures. Hydrocodone -States had shortness of breath, 911 was called    Loratadine Anxiety and Other (See Comments)    Heart speeds up     HOME MEDICATIONS: Outpatient Medications Prior to Visit  Medication Sig Dispense Refill   calcium  carbonate (TUMS - DOSED IN MG ELEMENTAL CALCIUM ) 500 MG chewable tablet Chew 1,000 mg by mouth in the morning and at bedtime.     clopidogrel  (PLAVIX ) 75 MG tablet Take 1 tablet (75 mg total) by mouth daily. 90 tablet 3   ezetimibe  (ZETIA ) 10 MG tablet Take 1 tablet (10  mg total) by mouth daily. 90 tablet 3   lisinopril  (ZESTRIL ) 20 MG tablet TAKE 1 TABLET(20 MG) BY MOUTH DAILY 90 tablet 3   MAGNESIUM PO Take 1 each by mouth daily.     carvedilol  (COREG ) 3.125 MG tablet TAKE 1 TABLET(3.125 MG) BY MOUTH TWICE DAILY WITH A MEAL (Patient not taking: Reported on 08/16/2024) 60 tablet 3   rosuvastatin  (CRESTOR ) 10 MG tablet Take 1 tablet (10 mg total) by mouth daily. (Patient not taking: Reported on 08/16/2024) 90 tablet 3   No facility-administered medications prior to visit.    PAST MEDICAL HISTORY: Past Medical History:  Diagnosis Date   Acute prostatitis 02/04/2008   ATRIAL MYXOMA 02/04/2008   Closed fracture of four ribs 11/01/2009   HYPERLIPIDEMIA  02/04/2008   HYPERTENSION 02/04/2008   Osteoarthritis of right knee 04/18/2010    PAST SURGICAL HISTORY: Past Surgical History:  Procedure Laterality Date   CATARACT EXTRACTION Left 08/03/2019   CORONARY ARTERY BYPASS GRAFT  2007   x2 Saph vein   COX-MAZE MICROWAVE ABLATION  2007   EXCISION OF ATRIAL MYXOMA  2007   Dr C. Owen   LOOP RECORDER INSERTION N/A 08/05/2023   Procedure: LOOP RECORDER INSERTION;  Surgeon: Nancey Eulas BRAVO, MD;  Location: [facility] INVASIVE CV LAB;  Service: Cardiovascular;  Laterality: N/A;    FAMILY HISTORY: Family History  Problem Relation Age of Onset   Stroke Mother    Heart disease Father        MI 79   Hypertension Father    Ovarian cancer Sister     SOCIAL HISTORY: Social History   Socioeconomic History   Marital status: Married    Spouse name: Not on file   Number of children: Not on file   Years of education: Not on file   Highest education level: Not on file  Occupational History   Occupation: married  Tobacco Use   Smoking status: Never   Smokeless tobacco: Never  Vaping Use   Vaping status: Never Used  Substance and Sexual Activity   Alcohol use: No   Drug use: No   Sexual activity: Yes  Other Topics Concern   Not on file  Social History Narrative   Married will be 50 years in 2019 (wife seen at another practice). 2 children-daughter unmarried, professional student 75 and son 31 in 2016. 2 grandsons-17 and 5 in 2019.       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, does his own carpentry work, still working around [employer]: fishing   Social Drivers of [employer]: Low Risk  (07/21/2024)   Overall Physicist, medical Strain (CARDIA)    Difficulty of Paying Living Expenses: Not hard at all  Food Insecurity: No Food Insecurity (07/21/2024)   Hunger Vital Sign    Worried About Running Out of Food in the Last Year: Never true    Ran Out of Food in the Last Year: Never true  Transportation Needs: No  Transportation Needs (07/21/2024)   PRAPARE - Administrator, Civil Service (Medical): No    Lack of Transportation (Non-Medical): No  Physical Activity: Sufficiently Active (07/21/2024)   Exercise Vital Sign    Days of Exercise per Week: 7 days    Minutes of Exercise per Session: 30 min  Stress: No Stress Concern Present (07/21/2024)   [employer] of Occupational Health - Occupational Stress Questionnaire    Feeling of Stress: Not at all  Social Connections: Moderately Isolated (07/21/2024)   Social Connection and Isolation Panel    Frequency of Communication with Friends and Family: More than three times a week    Frequency of Social Gatherings with Friends and Family: More than three times a week    Attends Religious Services: Never    Database administrator or Organizations: No    Attends Banker Meetings: Never    Marital Status: Married  Catering manager Violence: Not At Risk (07/21/2024)   Humiliation, Afraid, Rape, and Kick questionnaire    Fear of Current or Ex-Partner: No    Emotionally Abused: No    Physically Abused: No    Sexually Abused: No     PHYSICAL EXAM  GENERAL EXAM/CONSTITUTIONAL: Vitals:  Vitals:   08/16/24 1542  BP: (!) 146/81  Pulse: 98  Weight: 164 lb (74.4 kg)  Height: 5' 9.5 (1.765 m)   Body mass index is 23.87 kg/m. Wt Readings from Last 3 Encounters:  08/16/24 164 lb (74.4 kg)  07/21/24 170 lb (77.1 kg)  04/29/24 170 lb (77.1 kg)   Patient is in no distress; well developed, nourished and groomed; neck is supple  MUSCULOSKELETAL: Gait, strength, tone, movements noted in Neurologic exam below  NEUROLOGIC: MENTAL STATUS:      No data to display         awake, alert, oriented to person, place and time recent and remote memory intact normal attention and concentration language fluent, comprehension intact, naming intact fund of knowledge appropriate  CRANIAL NERVE:  2nd, 3rd, 4th, 6th - Visual fields  full to confrontation, extraocular muscles intact, no nystagmus 5th - facial sensation symmetric 7th - facial strength symmetric 8th - hearing intact 9th - palate elevates symmetrically, uvula midline 11th - shoulder shrug symmetric 12th - tongue protrusion midline  MOTOR:  normal bulk and tone, full strength in the BUE, BLE  SENSORY:  normal and symmetric to light touch  COORDINATION:  finger-nose-finger, fine finger movements normal  GAIT/STATION:  normal   DIAGNOSTIC DATA (LABS, IMAGING, TESTING) - I reviewed patient records, labs, notes, testing and imaging myself where available.  Lab Results  Component Value Date   WBC 6.9 04/29/2024   HGB 15.0 04/29/2024   HCT 43.8 04/29/2024   MCV 90.0 04/29/2024   PLT 258.0 04/29/2024      Component Value Date/Time   NA 137 04/29/2024 1014   K 4.0 04/29/2024 1014   CL 100 04/29/2024 1014   CO2 30 04/29/2024 1014   GLUCOSE 105 (H) 04/29/2024 1014   BUN 13 04/29/2024 1014   CREATININE 1.15 04/29/2024 1014   CALCIUM  10.0 04/29/2024 1014   PROT 7.4 04/29/2024 1014   ALBUMIN 4.4 04/29/2024 1014   AST 18 04/29/2024 1014   ALT 17 04/29/2024 1014   ALKPHOS 38 (L) 04/29/2024 1014   BILITOT 0.7 04/29/2024 1014   GFRNONAA >60 10/02/2023 1624   GFRAA  10/19/2009 0315    >60        The eGFR has been calculated using the MDRD equation. This calculation has not been validated in all clinical situations. eGFR's persistently <60 mL/min signify possible Chronic Kidney Disease.   Lab Results  Component Value Date   CHOL 229 (H) 04/29/2024   HDL 55.70 04/29/2024   LDLCALC 152 (H) 04/29/2024   LDLDIRECT 87.0 06/16/2023   TRIG 106.0 04/29/2024   CHOLHDL 4 04/29/2024   Lab Results  Component Value Date   HGBA1C 6.0 04/29/2024   No results  found for: VITAMINB12 Lab Results  Component Value Date   TSH 2.03 04/10/2015    MRI Brain 04/22/2023 1. Multiple small foci of acute ischemia along the left precentral and  postcentral gyri. No hemorrhage or mass effect. 2. 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  MRI Brain 08/02/2023 Multiple small acute infarcts in the left frontal and parietal lobes.  CTA Head and Neck 04/21/2023 1. No large vessel occlusion, hemodynamically significant stenosis, dissection, or aneurysm in the head or neck vessels. 2. Multilevel cervical spondylosis, worst at C5-6 where there is at least moderate spinal canal stenosis.  CTA Head and Neck 08/01/2023 1. Suspect small acute infarct in the left frontal lobe. ASPECTS is 9. 2. Patent vasculature of the head and neck with mild plaque at the carotid bifurcations but no hemodynamically significant stenosis, occlusion, or dissection.   ASSESSMENT AND PLAN  78 y.o. year old male with past medical history of hypertension, hyperlipidemia who is presenting for stroke follow-up, he is doing well on Plavix , Zetia  and lisinopril .  Again he does not want take the Eliquis  since he believes the Eliquis  causing the SVT.  He understands that Plavix  is not an anticoagulant and Eliquis  should be his treatment due to his atrial fibrillation but again he voiced understanding and decided to continue with Plavix .  Plan will be for patient to continue current medications, since he does not have any deficit from his stroke, he will continue to follow with PCP and return as needed.  In terms of his changes on cervical spine MRI.  I did advise him to contact me if he does experience any worsening neck pain, any weakness numbness shooting pain of the bilateral upper extremity.  He voiced understanding.   1. Cerebrovascular accident (CVA) due to occlusion of left middle cerebral artery [facility])      Patient Instructions  Continue current medications  Continue to follow up with your doctors  Please contact me if you worsening neck pain, bilateral arms numbness, tingling or shooting pain. Return as needed or  any other concerns.   No orders of the defined types were placed in this encounter.   No orders of the defined types were placed in this encounter.   Return if symptoms worsen or fail to improve.  I have spent a total of 50 minutes dedicated to this patient today, preparing to see patient, performing a medically appropriate examination and evaluation, ordering tests and/or medications and procedures, and counseling and educating the patient/family/caregiver; independently interpreting result and communicating results to the family/patient/caregiver; and documenting clinical information in the electronic medical record.   Pastor Falling, MD 08/16/2024, 6:00 PM  [facility] [address] [place], [place] [address] [phone]

## 2024-09-02 DIAGNOSIS — L570 Actinic keratosis: Secondary | ICD-10-CM | POA: Diagnosis not present

## 2024-09-02 DIAGNOSIS — L821 Other seborrheic keratosis: Secondary | ICD-10-CM | POA: Diagnosis not present

## 2024-09-02 DIAGNOSIS — C44519 Basal cell carcinoma of skin of other part of trunk: Secondary | ICD-10-CM | POA: Diagnosis not present

## 2024-09-02 DIAGNOSIS — D492 Neoplasm of unspecified behavior of bone, soft tissue, and skin: Secondary | ICD-10-CM | POA: Diagnosis not present

## 2024-09-09 ENCOUNTER — Other Ambulatory Visit: Payer: Self-pay | Admitting: Family Medicine

## 2024-09-09 DIAGNOSIS — H26492 Other secondary cataract, left eye: Secondary | ICD-10-CM | POA: Diagnosis not present

## 2024-11-11 NOTE — Progress Notes (Signed)
 Ethan Payne                                          MRN: [id number]   11/11/2024   The [facility] Quality Team Specialist reviewed this patient medical record for the purposes of chart review for care gap closure. The following were reviewed: chart review for care gap closure-kidney health evaluation for diabetes:eGFR  and uACR.    [facility]BCI Quality Team

## 2024-12-03 ENCOUNTER — Other Ambulatory Visit: Payer: Self-pay | Admitting: Pulmonary Disease

## 2024-12-09 ENCOUNTER — Other Ambulatory Visit: Payer: Self-pay | Admitting: Family Medicine

## 2024-12-09 MED ORDER — LISINOPRIL 20 MG PO TABS: 20.0000 mg | ORAL_TABLET | Freq: Every day | ORAL | 3 refills | Status: AC

## 2024-12-09 MED ORDER — CLOPIDOGREL BISULFATE 75 MG PO TABS: 75.0000 mg | ORAL_TABLET | Freq: Every day | ORAL | 3 refills | Status: AC

## 2024-12-09 NOTE — Telephone Encounter (Signed)
 Copied from CRM #[id number]. Topic: Clinical - Medication Refill >> Dec 09, 2024 12:42 PM Nessti S wrote: Medication: clopidogrel  (PLAVIX ) 75 MG tablet lisinopril  (ZESTRIL ) 20 MG tablet  Has the patient contacted their pharmacy? Yes (Agent: If no, request that the patient contact the pharmacy for the refill. If patient does not wish to contact the pharmacy document the reason why and proceed with request.) (Agent: If yes, when and what did the pharmacy advise?)  This is the patient's preferred pharmacy:  [facility] ST[id number] [address], [place] - [address] AT [facility] [address] [place] [address] Phone: [phone] Fax: [phone]  Is this the correct pharmacy for this prescription? Yes If no, delete pharmacy and type the correct one.   Has the prescription been filled recently? Yes  Is the patient out of the medication? No. Have 4 pills left  Has the patient been seen for an appointment in the last year OR does the patient have an upcoming appointment? Yes  Can we respond through MyChart? No  Agent: Please be advised that Rx refills may take up to 3 business days. We ask that you follow-up with your pharmacy.

## 2025-01-27 ENCOUNTER — Encounter: Admitting: Physical Medicine & Rehabilitation

## 2025-07-25 ENCOUNTER — Ambulatory Visit
